# Patient Record
Sex: Male | Born: 1945 | Race: White | Hispanic: No | Marital: Married | Smoking: Former smoker
Health system: Southern US, Community
[De-identification: ages and names within clinical notes are randomized; demographics above are authoritative.]

## PROBLEM LIST (undated history)

## (undated) DIAGNOSIS — Z89612 Acquired absence of left leg above knee: Secondary | ICD-10-CM

## (undated) DIAGNOSIS — Z950 Presence of cardiac pacemaker: Secondary | ICD-10-CM

## (undated) DIAGNOSIS — J84111 Idiopathic interstitial pneumonia, not otherwise specified: Secondary | ICD-10-CM

## (undated) DIAGNOSIS — J449 Chronic obstructive pulmonary disease, unspecified: Secondary | ICD-10-CM

## (undated) DIAGNOSIS — R918 Other nonspecific abnormal finding of lung field: Secondary | ICD-10-CM

## (undated) DIAGNOSIS — J9621 Acute and chronic respiratory failure with hypoxia: Secondary | ICD-10-CM

## (undated) DIAGNOSIS — I495 Sick sinus syndrome: Secondary | ICD-10-CM

## (undated) DIAGNOSIS — I482 Chronic atrial fibrillation, unspecified: Secondary | ICD-10-CM

## (undated) DIAGNOSIS — J439 Emphysema, unspecified: Secondary | ICD-10-CM

## (undated) DIAGNOSIS — I4819 Other persistent atrial fibrillation: Secondary | ICD-10-CM

## (undated) DIAGNOSIS — G4733 Obstructive sleep apnea (adult) (pediatric): Secondary | ICD-10-CM

## (undated) DIAGNOSIS — J45909 Unspecified asthma, uncomplicated: Secondary | ICD-10-CM

## (undated) DIAGNOSIS — T82390A Other mechanical complication of aortic (bifurcation) graft (replacement), initial encounter: Secondary | ICD-10-CM

## (undated) DIAGNOSIS — I4891 Unspecified atrial fibrillation: Secondary | ICD-10-CM

## (undated) DIAGNOSIS — Z951 Presence of aortocoronary bypass graft: Secondary | ICD-10-CM

## (undated) DIAGNOSIS — M359 Systemic involvement of connective tissue, unspecified: Secondary | ICD-10-CM

## (undated) DIAGNOSIS — Z9981 Dependence on supplemental oxygen: Secondary | ICD-10-CM

## (undated) DIAGNOSIS — Z79899 Other long term (current) drug therapy: Secondary | ICD-10-CM

## (undated) DIAGNOSIS — Z7901 Long term (current) use of anticoagulants: Secondary | ICD-10-CM

## (undated) DIAGNOSIS — I2510 Atherosclerotic heart disease of native coronary artery without angina pectoris: Secondary | ICD-10-CM

## (undated) DIAGNOSIS — I10 Essential (primary) hypertension: Secondary | ICD-10-CM

## (undated) DIAGNOSIS — I5032 Chronic diastolic (congestive) heart failure: Secondary | ICD-10-CM

## (undated) DIAGNOSIS — J84112 Idiopathic pulmonary fibrosis: Secondary | ICD-10-CM

## (undated) DIAGNOSIS — I70208 Unspecified atherosclerosis of native arteries of extremities, other extremity: Secondary | ICD-10-CM

## (undated) DIAGNOSIS — Z8679 Personal history of other diseases of the circulatory system: Secondary | ICD-10-CM

## (undated) DIAGNOSIS — I82629 Acute embolism and thrombosis of deep veins of unspecified upper extremity: Secondary | ICD-10-CM

## (undated) DIAGNOSIS — E43 Unspecified severe protein-calorie malnutrition: Secondary | ICD-10-CM

## (undated) DIAGNOSIS — J189 Pneumonia, unspecified organism: Secondary | ICD-10-CM

## (undated) HISTORY — DX: Long term (current) use of anticoagulants: Z79.01

## (undated) HISTORY — DX: Atherosclerotic heart disease of native coronary artery without angina pectoris: I25.10

## (undated) HISTORY — DX: Chronic obstructive pulmonary disease, unspecified: J44.9

## (undated) HISTORY — DX: Presence of cardiac pacemaker: Z95.0

## (undated) HISTORY — PX: SPINAL FUSION: SHX223

## (undated) HISTORY — DX: Chronic diastolic (congestive) heart failure: I50.32

## (undated) HISTORY — DX: Other long term (current) drug therapy: Z79.899

## (undated) HISTORY — DX: Other mechanical complication of aortic (bifurcation) graft (replacement), initial encounter: T82.390A

## (undated) HISTORY — DX: Unspecified atrial fibrillation: I48.91

## (undated) HISTORY — DX: Obstructive sleep apnea (adult) (pediatric): G47.33

## (undated) HISTORY — DX: Unspecified severe protein-calorie malnutrition: E43

## (undated) HISTORY — DX: Pneumonia, unspecified organism: J18.9

## (undated) HISTORY — DX: Acute embolism and thrombosis of deep veins of unspecified upper extremity: I82.629

## (undated) HISTORY — DX: Emphysema, unspecified: J43.9

## (undated) HISTORY — DX: Unspecified atherosclerosis of native arteries of extremities, other extremity: I70.208

## (undated) HISTORY — DX: Other persistent atrial fibrillation: I48.19

## (undated) HISTORY — DX: Personal history of other diseases of the circulatory system: Z86.79

## (undated) HISTORY — DX: Acquired absence of left leg above knee: Z89.612

## (undated) HISTORY — PX: BELOW KNEE LEG AMPUTATION: SUR23

## (undated) HISTORY — DX: Other nonspecific abnormal finding of lung field: R91.8

## (undated) HISTORY — DX: Dependence on supplemental oxygen: Z99.81

## (undated) HISTORY — DX: Presence of aortocoronary bypass graft: Z95.1

## (undated) HISTORY — DX: Sick sinus syndrome: I49.5

## (undated) HISTORY — DX: Chronic atrial fibrillation, unspecified: I48.20

---

## 1999-01-02 ENCOUNTER — Encounter: Admission: RE | Admit: 1999-01-02 | Discharge: 1999-04-02 | Payer: Self-pay | Admitting: Anesthesiology

## 1999-01-19 ENCOUNTER — Encounter: Payer: Self-pay | Admitting: Pulmonary Disease

## 1999-04-07 ENCOUNTER — Encounter: Admission: RE | Admit: 1999-04-07 | Discharge: 1999-07-06 | Payer: Self-pay | Admitting: Anesthesiology

## 1999-05-12 ENCOUNTER — Encounter: Payer: Self-pay | Admitting: Pulmonary Disease

## 1999-05-12 ENCOUNTER — Ambulatory Visit: Admission: RE | Admit: 1999-05-12 | Discharge: 1999-05-12 | Payer: Self-pay | Admitting: Pulmonary Disease

## 1999-07-17 ENCOUNTER — Encounter: Admission: RE | Admit: 1999-07-17 | Discharge: 1999-10-15 | Payer: Self-pay | Admitting: Anesthesiology

## 1999-09-12 ENCOUNTER — Encounter: Payer: Self-pay | Admitting: Pulmonary Disease

## 1999-09-12 ENCOUNTER — Ambulatory Visit: Admission: RE | Admit: 1999-09-12 | Discharge: 1999-09-12 | Payer: Self-pay | Admitting: Pulmonary Disease

## 1999-10-20 ENCOUNTER — Encounter: Admission: RE | Admit: 1999-10-20 | Discharge: 2000-01-12 | Payer: Self-pay | Admitting: Anesthesiology

## 2000-01-12 ENCOUNTER — Encounter: Admission: RE | Admit: 2000-01-12 | Discharge: 2000-04-11 | Payer: Self-pay | Admitting: Anesthesiology

## 2000-04-19 ENCOUNTER — Encounter: Admission: RE | Admit: 2000-04-19 | Discharge: 2000-07-18 | Payer: Self-pay | Admitting: Anesthesiology

## 2000-06-11 HISTORY — PX: THORACIC AORTIC ANEURYSM REPAIR: SHX799

## 2000-06-11 HISTORY — PX: CORONARY ARTERY BYPASS GRAFT: SHX141

## 2000-07-22 ENCOUNTER — Encounter: Admission: RE | Admit: 2000-07-22 | Discharge: 2000-10-20 | Payer: Self-pay | Admitting: Anesthesiology

## 2000-10-17 ENCOUNTER — Encounter: Admission: RE | Admit: 2000-10-17 | Discharge: 2001-01-15 | Payer: Self-pay | Admitting: Anesthesiology

## 2004-04-04 ENCOUNTER — Encounter: Payer: Self-pay | Admitting: Pulmonary Disease

## 2005-04-27 ENCOUNTER — Ambulatory Visit: Payer: Self-pay | Admitting: Pulmonary Disease

## 2005-10-24 ENCOUNTER — Ambulatory Visit: Payer: Self-pay | Admitting: Pulmonary Disease

## 2006-04-18 ENCOUNTER — Ambulatory Visit: Payer: Self-pay | Admitting: Pulmonary Disease

## 2006-04-23 ENCOUNTER — Ambulatory Visit: Admission: RE | Admit: 2006-04-23 | Discharge: 2006-04-23 | Payer: Self-pay | Admitting: Pulmonary Disease

## 2006-04-23 ENCOUNTER — Encounter: Payer: Self-pay | Admitting: Pulmonary Disease

## 2007-11-18 ENCOUNTER — Ambulatory Visit: Payer: Self-pay | Admitting: Pulmonary Disease

## 2007-11-18 DIAGNOSIS — J439 Emphysema, unspecified: Secondary | ICD-10-CM

## 2007-11-18 DIAGNOSIS — G4733 Obstructive sleep apnea (adult) (pediatric): Secondary | ICD-10-CM

## 2007-11-18 HISTORY — DX: Emphysema, unspecified: J43.9

## 2007-11-27 ENCOUNTER — Telehealth: Payer: Self-pay | Admitting: Pulmonary Disease

## 2007-12-23 ENCOUNTER — Telehealth (INDEPENDENT_AMBULATORY_CARE_PROVIDER_SITE_OTHER): Payer: Self-pay | Admitting: *Deleted

## 2007-12-23 ENCOUNTER — Encounter: Payer: Self-pay | Admitting: Pulmonary Disease

## 2008-01-22 ENCOUNTER — Telehealth (INDEPENDENT_AMBULATORY_CARE_PROVIDER_SITE_OTHER): Payer: Self-pay | Admitting: *Deleted

## 2008-04-01 ENCOUNTER — Telehealth (INDEPENDENT_AMBULATORY_CARE_PROVIDER_SITE_OTHER): Payer: Self-pay | Admitting: *Deleted

## 2008-11-16 ENCOUNTER — Ambulatory Visit: Payer: Self-pay | Admitting: Pulmonary Disease

## 2009-04-16 ENCOUNTER — Encounter: Payer: Self-pay | Admitting: Pulmonary Disease

## 2009-06-09 ENCOUNTER — Telehealth (INDEPENDENT_AMBULATORY_CARE_PROVIDER_SITE_OTHER): Payer: Self-pay | Admitting: *Deleted

## 2010-03-23 ENCOUNTER — Ambulatory Visit: Payer: Self-pay | Admitting: Pulmonary Disease

## 2010-07-11 NOTE — Assessment & Plan Note (Signed)
Summary: rov for emphysema/asthma, osa   Visit Type:  Follow-up  CC:  1 year follow up. pt states he would like to talk about his inhalers. pt states breathing is doing good. pt states he has an on and off cough due to cpap machine makes his mouth dry. Pt states cpap use 5/7 nights x 6-8 hrs a night. quit smoking in 1998.Marland Kitchen  History of Present Illness: the pt comes in today for f/u of his known emphysema and osa.  He is wearing cpap compliantly, and is having no issues with his cpap mask or pressure.  He feels that he sleeps well, and is satisfied with his daytime alertness.  He is having some mouth dryness, and I have asked him to adjust his heated humidifier.  In terms of his emphysema, he feels that he is maintaining his usual baseline.  He is using advair sometimes only once a day, and feels the dose is often adequate for him.  Current Medications (verified): 1)  Mucinex 600 Mg  Tb12 (Guaifenesin) .... Take 1 Tablet By Mouth Two Times A Day 2)  Aleve 220 Mg  Tabs (Naproxen Sodium) .... Take 1 Tablet By Mouth Two Times A Day 3)  Furosemide 40 Mg  Tabs (Furosemide) .... Take 1 Tablet By Mouth Once A Day 4)  Isosorbide Mononitrate Cr 60 Mg  Tb24 (Isosorbide Mononitrate) .... Take 1 1/2 Tabs By Mouth Once Daily 5)  Omeprazole 20 Mg  Tbec (Omeprazole) .... Take 1 Tablet By Mouth Once A Day 6)  Aspirin Low Dose 81 Mg  Tabs (Aspirin) .... Take 2 Tablets By Mouth Once A Day 7)  Docusate Sodium 100 Mg  Tabs (Docusate Sodium) .... Take 1 Tablet By Mouth Two Times A Day 8)  Cascara Sagr[place] 450 Mg  Caps (Cascara [place]) .... Take 1 Tablet By Mouth Once A Day For Constipation 9)  Nitrostat 0.4 Mg  Subl (Nitroglycerin) .... Take By Mouth As Needed 10)  Neurontin 600 Mg Tabs (Gabapentin) .... Take 1 Tablet By Mouth Three Times A Day 11)  Ramipril 2.5 Mg Caps (Ramipril) .... Take 1 Tablet By Mouth Once A Day 12)  Advair Diskus 250-50 Mcg/dose  Misc (Fluticasone-Salmeterol) .... One Puff Twice Daily 13)   Proair Hfa 108 (90 Base) Mcg/act  Aers (Albuterol Sulfate) .... 2 Puffs Every 4-6 Hours As Needed 14)  Oxycodone Hcl 30 Mg Tabs (Oxycodone Hcl) .Marland Kitchen.. 1 Tablet Every 4 Hrs As Needed  Allergies (verified): 1)  ! Morphine 2)  ! * Fentanyl 3)  ! Lopressor 4)  Duragesic-12 (Fentanyl) 5)  Morphine Sulfate (Morphine Sulfate)  Review of Systems       The patient complains of shortness of breath with activity and non-productive cough.  The patient denies shortness of breath at rest, productive cough, coughing up blood, chest pain, irregular heartbeats, acid heartburn, indigestion, loss of appetite, weight change, abdominal pain, difficulty swallowing, sore throat, tooth/dental problems, headaches, nasal congestion/difficulty breathing through nose, sneezing, itching, ear ache, anxiety, depression, hand/feet swelling, joint stiffness or pain, rash, change in color of mucus, and fever.    Vital Signs:  Patient profile:   65 year old male Height:      76 inches Weight:      233.25 pounds BMI:     28.49 O2 Sat:      93 % on Room air Temp:     97.8 degrees F oral Pulse rate:   88 / minute BP sitting:   128 / 68  (  left arm) Cuff size:   large  Vitals Entered By: Carver Fila (March 23, 2010 10:58 AM)  O2 Flow:  Room air CC: 1 year follow up. pt states he would like to talk about his inhalers. pt states breathing is doing good. pt states he has an on and off cough due to cpap machine makes his mouth dry. Pt states cpap use 5/7 nights x 6-8 hrs a night. quit smoking in 1998. Comments meds and allergies updated Phone number updated Carver Fila  March 23, 2010 10:58 AM    Physical Exam  General:  wd male in nad Nose:  no skin breakdown or pressure necrosis from  cpap mask Lungs:  mild decrease in bs, no wheezing or rhonchi Heart:  rrr, no mrg Extremities:  mild LE edema on right, no cyanosis  Neurologic:  alert and oriented, moves all 4.   Impression & Recommendations:  Problem # 1:   EMPHYSEMA (ICD-492.8)  the pt is doing fairly well from a breathing standpoint, and I have asked  him to continue with advair.  We have discussed the dosage issue, and have given him the green light to vary his dose from one to two times a day depending upon how he feels.  If he is having to use his rescue inhaler daily, obviously he needs to be on the higher maintenance dose of advair.  Problem # 2:  OBSTRUCTIVE SLEEP APNEA (ICD-327.23)  the pt is doing well with cpap, and I have asked him to keep up with supplies and mask replacement  Medications Added to Medication List This Visit: 1)  Oxycodone Hcl 30 Mg Tabs (Oxycodone hcl) .Marland Kitchen.. 1 tablet every 4 hrs as needed  Other Orders: Flu Vaccine 46yrs + MEDICARE PATIENTS (Z6109) Administration Flu vaccine - MCR (G0008) Est. Patient Level III (60454)  Patient Instructions: 1)  can experiment with advair dose to find what works for you. 2)  continue with proair for rescue 3)  stay with cpap, keep up with mask changes and supplies 4)  will give you a flu shot today 5)  followup with me in one year.  Prescriptions: PROAIR HFA 108 (90 BASE) MCG/ACT  AERS (ALBUTEROL SULFATE) 2 puffs every 4-6 hours as needed  #1 x 12   Entered and Authorized by:   Barbaraann Share MD   Signed by:   Barbaraann Share MD on 03/23/2010   Method used:   Print then Give to Patient   RxID:   [id number] ADVAIR DISKUS 250-50 MCG/DOSE  MISC (FLUTICASONE-SALMETEROL) One puff twice daily  #1 x 12   Entered and Authorized by:   Barbaraann Share MD   Signed by:   Barbaraann Share MD on 03/23/2010   Method used:   Print then Give to Patient   RxID:   [id number]    Immunization History:  Influenza Immunization History:    Influenza:  historical (03/11/2009)       Flu Vaccine Consent Questions     Do you have a history of severe allergic reactions to this vaccine? no    Any prior history of allergic reactions to egg and/or gelatin? no    Do you have a  sensitivity to the preservative Thimersol? no    Do you have a past history of Guillan-Barre Syndrome? no    Do you currently have an acute febrile illness? no    Have you ever had a severe reaction to latex? no    Vaccine information given  and explained to patient? yes    Are you currently pregnant? no    Lot Number:[id number]   Exp Date:12/09/2010   Site Given  right Deltoid IMedflu  Carver Fila  March 23, 2010 11:34 AM

## 2010-10-27 NOTE — Procedures (Signed)
[facility]  Patient:    Grant Santos, Grant Santos                   MRN: [id number] Proc. Date: 02/02/00 Adm. Date:  [id number] Attending:  Thyra Breed CC:         Candy Sledge, M.D.                           Procedure Report  PROCEDURE:  Intravenous infusion of lidocaine.  ANESTHESIOLOGIST:  Thyra Breed, M.D.  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  INTERVAL HISTORY:  The patient has noted that his pain level is back up to 5/10.  He was last seen on January 12, 2000 for an infusion; we gave him 600 mg at that time.  He had a very positive response to this.  EXAMINATION:  VITAL SIGNS:  Blood pressure 138/70, heart rate is 80, respiratory rate is 17, O2 saturation is 97% and temperature is 97.3.  Pain level is 5/10.  CHEST:  The lungs are clear.  The patient is very tender on his left upper chest to light touch.  DESCRIPTION OF PROCEDURE:  After informed consent was obtained, the patient was placed in the supine position and monitored.  An IV was established in the right upper extremity.  I personally administered lidocaine 750 mg; the patients pain level went down to 0.  POST-PROCEDURAL CONDITION:  Stable.  DISCHARGE INSTRUCTIONS 1. Resume previous diet. 2. Limitation in activities per instruction sheet. 3. Continue on current medications, with prescription written for Neurontin    800 mg one p.o. b.i.d., #60 with four refills. 4. Follow up with me in three weeks to repeat his injection. DD:  02/02/00 TD:  02/05/00 Job: [id number]

## 2010-10-27 NOTE — Procedures (Signed)
[facility]  Patient:    Grant Santos, Grant Santos                   MRN: [id number] Proc. Date: 11/24/99 Adm. Date:  [id number] Attending:  Thyra Breed CC:         Candy Sledge, M.D.                           Procedure Report  PROCEDURE:  IV infusion of lidocaine using 300 mg.  INTERVAL HISTORY:  The patient noted that he had a marked improvement after his last infusion of chloroprocaine, but he developed a headache which lasted about seven days.  The headache occurred about six to seven hours after the infusion.  He was asking whether this could be related, and I advised him that it did not seem to be directly related but conceivable that the paramenobenzoic acid of the chloroprocaine might have exacerbated his headache-like syndrome.  Overall, he has done very well with his last infusion.  PHYSICAL EXAMINATION:  VITAL SIGNS:  Blood pressure 146-76, heart rate 84, respiratory rate 16, O2 saturation 95%, temperature 97.1.  Pain level is 1 out of 10.  NEUROLOGIC:  Neurologic exam is grossly unchanged.  DESCRIPTION OF PROCEDURE:  After informed consent was obtained, the patient was placed in the sitting position and an IV established in his right upper extremity.  Monitors were placed.  He was given 300 mg of lidocaine intravenously by me personally.  He tolerated this well.  POSTPROCEDURE CONDITION:  The patient noted resolution of his pain and no pressure-like sensation in his chest.  DISCHARGE INSTRUCTIONS: 1. Resume previous diet. 2. Limitation of activities per instruction sheet. 3. Continue on current medications. 4. Follow up with me in four weeks to consider repeat infusion. DD:  11/24/99 TD:  11/29/99 Job: [id number] NF/[id number]

## 2010-10-27 NOTE — Procedures (Signed)
[facility]  Patient:    Grant Santos, Grant Santos                   MRN: [id number] Proc. Date: 12/22/99 Adm. Date:  [id number] Attending:  Thyra Breed CC:         Candy Sledge, M.D.                           Procedure Report  PROCEDURE:  IV infusion of lidocaine.  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  INTERVAL HISTORY:  The patient has noted that for the past week, he has had a recurrence of his pain.  He is in a lot of discomfort today.  He states that it radiates from his shoulder and all the way through interiorly into the chest wall.  PHYSICAL EXAMINATION: Blood pressure 135/77.  Heart rate is 82.  Respiratory rate 14.  O2 saturations 95%.  Pain level is 7/10, and temperature is 97.2. His neuro exam is grossly unchanged.  DESCRIPTION OF PROCEDURE:  After informed consent was obtained, the patient was placed in the sitting position and an IV established in his right upper extremity and monitors placed.  I personally administered 550 mg of lidocaine. The patient noted that his pain level dropped down to a level of approximately 1/10 following the infusion.  Postprocedure condition - stable with decreased pain.  DISPOSITION: 1. Resume previous diet. 2. Limitations on activities per instruction sheet. 3. Continue on current medications. 4. Followup with me in three weeks for repeat infusion. DD:  12/22/99 TD:  12/22/99 Job: [id number]

## 2010-10-27 NOTE — Procedures (Signed)
[facility]  Patient:    Grant Santos, Grant Santos                   MRN: [id number] Proc. Date: 12/27/00 Adm. Date:  [id number] Attending:  Thyra Breed CC:         Dr. Noreene Filbert   Procedure Report  PROCEDURE:  IV infusion of lidocaine.  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  INTERVAL HISTORY:  The patients done fairly well on the Duragesic patch. He applies them every two days but he does note that when he changes his clothes he has increased discomfort. Other than that, he states he has done well overall. He rates his pain at 4/10. He has minimal side effects to the medications.  CURRENT MEDICATIONS:  Neurontin, ______, Prilosec, Bextra, Pulmicort, Serevent, Duragesic patch, Atrovent and guaifenex.  PHYSICAL EXAMINATION:  Blood pressure 152/70, heart rate 83, respiratory rate 11, O2 saturations 96%, and pain level is 4/10. His neuro exam is grossly unchanged from his last visit. He is in good spirits today.  DESCRIPTION OF PROCEDURE:  After informed consent was obtained, an IV was established in his right upper extremity and monitors placed. I personally administered 300 mg of lidocaine intravenously. The patient tolerated this well and his pain level went down to 1/10.  CONDITION POST PROCEDURE:  Stable.  DISCHARGE INSTRUCTIONS: 1. Resume previous diet. 2. Limitations in activities per instruction sheet as outlined by my assistant    today. 3. Continue on current medications but at his next refill of his Duragesic,    we may go on up to 50 mcg every two days. I advised him to let us know    how he is doing. 4. Followup with me in three months for follow-up evaluation and six months    to consider repeat IV infusion. DD:  12/27/00 TD:  12/27/00 Job: [id number]

## 2010-10-27 NOTE — Procedures (Signed)
[facility]  Patient:    Grant Santos, Grant Santos                   MRN: [id number] Proc. Date: 04/19/00 Adm. Date:  [id number] Attending:  Thyra Breed CC:         Candy Sledge, M.D.   Procedure Report  PROCEDURES:  IV infusion of lidocaine.  DIAGNOSES:  Neuropathic chest pain, status post thoracotomy for aneurysm repair, most likely on the basis of a intercostal neuropathy associated with this.  INTERVAL HISTORY:  The patient has noted that this pain had recurred to a modest extent.  He saw Dr. Noreene Filbert earlier today.  He does feel as though the OxyContin may be helping him.  CURRENT MEDICATIONS:  OxyContin 10 mg b.i.d., Ultram p.r.n., ______ Atrovent, Serevent, Pulmicort, Celebrex, Prilosec, Wellbutrin, Carbatrol, and Neurontin.  PHYSICAL EXAMINATION:  VITAL SIGNS:  Blood pressure 135/84, heart rate 86, respirations 16, O2 saturation is 96%.  NEUROLOGICAL:  Examination is grossly unchanged.  DESCRIPTION OF PROCEDURE:  After informed consent was obtained, the patient was placed in the supine position and monitored.  An IV was established in his right upper extremity.  I personally administered 500 mg of lidocaine intravenously.  The patients pain level went down to about 2-3/10.  POSTPROCEDURE CONDITION:  Stable.  DISCHARGE INSTRUCTIONS: 1. Resume previous diet. 2. Limitation of activities per instruction sheet. 3. Continue current medications. 4. Prescription for OxyContin 10 mg one p.o. t.i.d., #90 with no refill    given. 5. ______ 150 mg one p.o. q.p.m., #30 with no refill.  The patient was    advised of the potential side effects of this medication in detail. 6. Followup with me in four weeks for a repeat infusion if needed. DD:  04/19/00 TD:  04/20/00 Job: [id number]

## 2010-10-27 NOTE — Op Note (Signed)
[facility]  Patient:    Grant Santos, Grant Santos                   MRN: [id number] Proc. Date: 07/31/00 Adm. Date:  [id number] Attending:  Thyra Breed CC:         Candy Sledge, M.D.   Operative Report  PROCEDURE:  Intravenous infusion of lidocaine.  DIAGNOSIS:  Neuropathic pain status post thoracotomy for resection of a thoracic aneurysm with element of intercostal neuropathy.  ANESTHESIOLOGIST:  Thyra Breed, M.D.  INTERVAL HISTORY:  The patient is now six weeks out and states that he has done much better which he attributes to his wheelchair and being on the OxyContin 10 mg 3 times a day.  His other medications include Atrovent, Serevent, Pulmicort, Celebrex, Prilosec, Wellbutrin, and Neurontin.  PHYSICAL EXAMINATION:  VITAL SIGNS:  Blood pressure 160/84, heart rate 75, respiratory rate 16, O2 saturation 97%.  NEUROLOGIC:  Grossly unchanged.  He does feel as though his pain is much more focused than previously.  DESCRIPTION OF PROCEDURE:  After informed consent was obtained, the patient was placed in the supine position and monitored.  An IV was established in the right upper extremity, and I personally administered 600 mg of lidocaine.  The patients pain level went down to 1/10.  POSTPROCEDURE CONDITION:  Stable.  DISCHARGE INSTRUCTIONS: 1. Resume previous diet. 2. Limitation of activities per instruction sheet. 3. Continue on OxyContin 10 mg 1 p.o. q.8h., #90 with no refill. 4. Follow up with me in six weeks for repeat infusion. DD:  07/31/00 TD:  08/01/00 Job: [id number] IO/[id number]

## 2010-10-27 NOTE — Op Note (Signed)
[facility]  Patient:    Grant Santos, Grant Santos                   MRN: [id number] Proc. Date: 10/20/99 Adm. Date:  [id number] Attending:  Thyra Breed CC:         Candy Sledge, M.D.             Barbaraann Share, M.D. [facility]                           Operative Report  PROCEDURE:  IV infusion of chloroprocaine.  DIAGNOSIS:  Neuropathic chest wall pain and low back discomfort.  INTERVAL HISTORY:  The patient has noted that the lidocaine does not seem to be holding his pain under as good control as previously.  He is requesting that we switch back over to the chloroprocaine.  He rates his pain at 6 out of 10 today.  This is in the same distribution as previously (over the left thoracic region).  EXAMINATION:  VITAL SIGNS:  Blood pressure 151/85, heart rate 92, respiratory rate 18, O2 saturations 95%, pain level 6 out of 10.  NEUROLOGIC:  He exhibits no change.  PROCEDURE:  After an informed consent was obtained, an IV was established in the left upper extremity.  Monitors were placed.  I personally administered 1500 mg of chloroprocaine intravenously.  The patient noted his pain level went down to 0 out of 10.  POSTPROCEDURE CONDITION:  Stable.  DISCHARGE INSTRUCTIONS: 1. Resume previous diet. 2. Limitation in activities per instruction sheet. 3. Continue with current medications. 4. The patient is to follow up with me in three to six weeks for a    repeat infusion. DD:  10/20/99 TD:  10/24/99 Job: [id number]

## 2010-10-27 NOTE — Procedures (Signed)
[facility]  Patient:    Grant Santos, Grant Santos                   MRN: [id number] Proc. Date: 03/22/00 Adm. Date:  [id number] Attending:  Thyra Breed CC:         Candy Sledge, M.D.   Procedure Report  PROCEDURE:  Intravenous infusion of lidocaine.  DIAGNOSIS:  Neuropathy and post thoracotomy pain syndrome.  INTERVAL HISTORY:  The patient initially noted increased discomfort following his last infusion, but as he had continued on the OxyContin, he has really mellowed out to a level of about 3/10.  He is in much better spirits today than he was at his last visit.  We continue to go through a saga with regard to his wheelchair.  PHYSICAL EXAMINATION:  Blood pressure 144/81, heart rate 93, respiratory rate 18, O2 saturation 94%, pain level 3/10.  His neurologic exam is grossly unchanged.  DESCRIPTION OF PROCEDURE:  After informed consent was obtained, the patient had an IV established in his right upper extremity.  Monitors were placed.  I personally administered 450 mg of lidocaine.  The patients pain level was down to 1/10 at most.  POSTPROCEDURE CONDITION:  Stable.  DISCHARGE INSTRUCTIONS: 1. Resume previous diet. 2. Limitations of activity per instruction sheath. 3. Continue on current medications. 4. A prescription for OxyContin 10 mg one p.o. b.i.d., #60, was written with    no refills. 5. Follow up with me in four weeks. DD:  03/22/00 TD:  03/24/00 Job: [id number]

## 2010-10-27 NOTE — Procedures (Signed)
[facility]  Patient:    Grant Santos, Grant Santos                   MRN: [id number] Proc. Date: 11/03/99 Adm. Date:  [id number] Attending:  Thyra Breed CC:         Candy Sledge, M.D.             Barbaraann Share, M.D. [facility]                           Procedure Report  PROCEDURE:  Intravenous infusion of chloroprocaine.  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  INTERVAL HISTORY:  The patient is doing fairly well today, responded quite nicely to his last infusion.  We gave him 1500 mm at his last visit.  PHYSICAL EXAMINATION:  VITAL SIGNS:  Blood pressure is 135/70, heart rate 89, respiratory rate 16, O2 saturation is 93%, pain level is 2 out of 10, and temperature is 97 degrees.  NEUROLOGIC:  Unchanged from previously.  DESCRIPTION OF PROCEDURE:  After informed consent was obtained, IV was established in the left upper extremity.  Monitors were placed.  I personally administered 1500 mg of chloroprocaine.  The patient noted his pain level down to 0 out of 10.  POSTPROCEDURE CONDITION:  Stable.  DISCHARGE INSTRUCTIONS: 1. Resume previous diet. 2. Limitation on activities per instruction sheet. 3. Continue current medications. 4. Follow up with me in three weeks for a repeat infusion.  We will do this    for two more runs and then spread it out to once a month thereafter. DD:  11/03/99 TD:  11/08/99 Job: [id number] IO/[id number]

## 2010-10-27 NOTE — Procedures (Signed)
[facility]  Patient:    Grant Santos, Grant Santos                   MRN: [id number] Proc. Date: 06/20/00 Adm. Date:  [id number] Attending:  Thyra Breed CC:         Candy Sledge, M.D.   Procedure Report  PROCEDURE:  IV infusion of lidocaine.  DIAGNOSIS:  Neuropathic pain over his chest wall status post resection of a thoracic aneurysm.  INTERVAL HISTORY:  The patients follow-up and infusion today were delayed by a wait because of his insurance company.  He has noted that his pain is much worse than it has been.  He continues on his previous medications which include Atrovent, Serevent, Pulmicort, Celebrex, Prilosec, Wellbutrin, OxyContin, and Neurontin.  PHYSICAL EXAMINATION:  Blood pressure 135/80.  Heart rate is 76.  Respiratory rate 20.  O2 saturations 97%.  Pain level is 6/10.  His neuro exam is grossly unchanged.  DESCRIPTION OF PROCEDURE:  After informed consent was obtained, the patient was placed in the supine position and monitored.  An IV was established in his left upper extremity, and I personally administered 600 mg of lidocaine. The patients pain level went down to a about 1/10.  Postprocedure condition - stable.  DISPOSITION: 1. Resume previous diet. 2. Limitations on activities per instruction sheet. 3. OxyContin 10 mg 1 p.o. q.8h. #90 with no refill. 4. Followup with me in four weeks for a repeat infusion. DD:  06/20/00 TD:  06/21/00 Job: [id number]

## 2010-10-27 NOTE — H&P (Signed)
[facility]  Patient:    Grant Santos, Grant Santos                   MRN: [id number] Adm. Date:  [id number] Attending:  Thyra Breed CC:         Candy Sledge, M.D.   History and Physical  FOLLOWUP EVALUATION  HISTORY OF PRESENT ILLNESS:  [name] comes in for follow-up evaluation of his neuropathic pain secondary to a thoracotomy for a resection of a thoracic aneurysm.  Since his last evaluation, the patient has had good days and bad, but we had to go up to 10 mg 3 x q.d. of OxyContin a few weeks ago because he was having increasing discomfort.  He has tolerated this well.  He continues to have some breakthrough between the doses.  He is tolerating it well otherwise.  He has also switched from Celebrex to Bextra.  CURRENT MEDICATIONS:  1. Pulmicort.  2. Serevent.  3. Neurontin 800 mg 2 x q.d.  4. Carbatrol 300 mg 2 x q.d.  5. Prilosec.  6. Bextra.  7. Guaifenex.  8. Atrovent.  9. OxyContin. 10. Aspirin q.d.  PHYSICAL EXAMINATION:  VITAL SIGNS:  Blood pressure 155/83, heart rate 92, respiratory rate 18, and O2 saturation 94%.  Pain level 3/10.  NEUROLOGIC:  Basically unchanged.  IMPRESSION:  1. 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  2. Other medical problems per primary care physician.  DISPOSITION:  1. Discontinue OxyContin.  2. Duragesic Patch 25 mcg 1 q.3 days, #10 with no refill.  3. The patient was advised to call if he was not noticing any benefits from     this.  4. Followup with me in 4 weeks, at which time we will consider repeat     infusion. DD:  10/18/00 TD:  10/18/00 Job: [id number]

## 2010-10-27 NOTE — Procedures (Signed)
[place]  Patient:    Grant Santos, TODISCO                   MRN: [id number] Proc. Date: 02/23/00 Adm. Date:  [id number] Attending:  Thyra Breed CC:         Candy Sledge, M.D.   Procedure Report  PROCEDURE:  IV infusion of lidocaine.  DIAGNOSIS:  Post thoracotomy pain syndrome with intercostal neuropathy.  INTERVAL HISTORY:  The patient notes that his pain is starting to come back pretty much with a vengeance. He rates it at 4/10 but if any weight touches his left shoulder or chest wall, he has a lot of discomfort.  PHYSICAL EXAMINATION:  VITAL SIGNS:  Blood pressure 140/77, heart rate 83, respiratory rate 13, O2 saturations 95%, pain 4/10 and temperature is 97.1.  LUNGS:  Clear.  HEART:  Regular rate and rhythm.  NEUROLOGIC:  Grossly unchanged. He does have allodynia over the left upper chest wall to light touch.  DESCRIPTION OF PROCEDURE:  After informed consent was obtained, the patient was placed in the supine position and monitored. An IV was established in the right upper extremity extremity. I personally administered 675 mg of lidocaine. The patients pain level went down to 0/10.  CONDITION POST PROCEDURE:  Stable.  DISCHARGE INSTRUCTIONS: 1. Resume previous diet. 2. Limitations in activities per instruction sheet. 3. Continue on current medications. 4. Continue on Neurontin. 5. The patient is scheduled to see me back in about 3 weeks for repeat    infusion. He seems to be coming back with his pain within a 3 week span    and I do not feel comfortable in spreading him out to 4 weeks for the    time being. He has done well. He has had a letter written for his    wheelchair so that he can continue to work productively. DD:  02/23/00 TD:  02/26/00 Job: [id number]

## 2010-10-27 NOTE — H&P (Signed)
[facility]  Patient:    Grant Santos, Grant Santos                   MRN: [id number] Adm. Date:  [id number] Attending:  Thyra Breed CC:         Candy Sledge, M.D.   History and Physical  PROCEDURES:  IV infusion of lidocaine.  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  INTERVAL HISTORY:  The patient was doing well up until about a week ago, but has noted his pain is starting to come back.  He did not tolerate the mexiletine due to GI problems.  He continues on Atrovent, Serevent, Pulmicort, Celebrex, Prilosec, Wellbutrin, _________ , and his Neurontin.  PHYSICAL EXAMINATION:  VITAL SIGNS:  Blood pressure 158/81, heart rate 76, respiratory rate 16, O2 saturations 96%.  He is tender over the left upper quadrant and left upper extremity as well as over his left chest wall.  NEUROLOGIC:  Otherwise unchanged.  PROCEDURE:  After informed consent was obtained, patient was placed in the supine position and monitored.  An IV was established in the right upper quadrant.  I personally administered 500 mg of lidocaine intravenously.  The patient noted that his pain level came down to nearly 0/10.  POST PROCEDURE CONDITION:  Stable.  DISCHARGE INSTRUCTIONS: 1. Resume previous diet. 2. Limitation of activities per instructions.  See as outlined by my assistant    today. 3. Remain off mexiletine. 4. OxyContin 10 mg one p.o. q.8h., #90, with no refill. 5. Follow up with me in four weeks for repeat infusion. DD:  05/17/00 TD:  05/17/00 Job: [id number]

## 2010-10-27 NOTE — Procedures (Signed)
[facility]  Patient:    Grant Santos, Grant Santos                   MRN: [id number] Proc. Date: 01/12/00 Adm. Date:  [id number] Attending:  Thyra Breed CC:         Candy Sledge, M.D.   Procedure Report  PREOPERATIVE DIAGNOSIS:  Left-sided chest wall pain on the basis of a post-thoracotomy chest syndrome with intercostal neuropathy.  POSTOPERATIVE DIAGNOSIS:  Left-sided chest wall pain on the basis of a post-thoracotomy chest syndrome with intercostal neuropathy.  PROCEDURE:  IV infusion of lidocaine.  SURGEON:  Thyra Breed, M.D.  ANESTHESIA:  INTERVAL HISTORY:  The patient has noted that his pain has come back, but not as bad as his previous visit.  He rates his pain at 4 out of 10.  He tells me that at work, he does not use a motorized wheelchair.  He is unable to make it through the day because he develops a sharp, sheering pain into his chest wall from the neuropathy.  When he uses the wheelchair, he is able to make it through the day and make it through the week.  PHYSICAL EXAMINATION:  Blood pressure 154/69, heart rate 81, respiratory rate 18, and O2 saturations 96%.  Pain level is 4 out of 10.  Temperature is 98.3. The patient is very guarding of his left upper extremity and apprehensive about anybody approaching him from that side.  His neurologic exam is grossly unchanged.  DESCRIPTION OF PROCEDURE:  After informed consent was obtained, the patient was placed in the supine position and monitored.  An IV was established in the right upper extremity.  I personally administered 600 mg of intravenous lidocaine and the patient noted that his pain dropped down to near zero.  POSTPROCEDURE CONDITION:  Stable.  DISCHARGE INSTRUCTIONS: 1. Resume previous diet. 2. Limitation and activities per instruction sheet. 3. Continue on current medication. 4. Followup with me in three weeks to consider repeat infusion. DD:  01/12/00 TD:   01/15/00 Job: [id number]

## 2011-03-15 ENCOUNTER — Encounter: Payer: Self-pay | Admitting: Pulmonary Disease

## 2011-03-21 ENCOUNTER — Encounter: Payer: Self-pay | Admitting: Pulmonary Disease

## 2011-03-21 ENCOUNTER — Ambulatory Visit (INDEPENDENT_AMBULATORY_CARE_PROVIDER_SITE_OTHER): Payer: Medicare Other | Admitting: Pulmonary Disease

## 2011-03-21 DIAGNOSIS — G4733 Obstructive sleep apnea (adult) (pediatric): Secondary | ICD-10-CM

## 2011-03-21 DIAGNOSIS — J438 Other emphysema: Secondary | ICD-10-CM

## 2011-03-21 DIAGNOSIS — Z23 Encounter for immunization: Secondary | ICD-10-CM

## 2011-03-21 MED ORDER — ALBUTEROL SULFATE HFA 108 (90 BASE) MCG/ACT IN AERS: 2.0000 | INHALATION_SPRAY | Freq: Four times a day (QID) | RESPIRATORY_TRACT | Status: AC | PRN

## 2011-03-21 NOTE — Assessment & Plan Note (Signed)
The patient is doing well from this standpoint, and has really not been taking his maintenance Advair.  He would like to see if he can get by on as needed albuterol alone.  I am okay with this, but have asked him to restart Advair if he sees worsening of his symptoms.

## 2011-03-21 NOTE — Assessment & Plan Note (Signed)
The patient has been doing well with CPAP, and denies any issues with his mask or pressure.  He feels that he is sleeping well, with adequate daytime alertness.  I've asked him to work aggressively on weight loss.

## 2011-03-21 NOTE — Patient Instructions (Signed)
Ok to try getting by with albuterol alone as needed.  If increased symptoms, need to get back on advair two times a day. Continue with cpap, work on weight loss Would be happy to review your ct chest if you bring disk by the office. followup with me in one year.

## 2011-03-21 NOTE — Progress Notes (Signed)
  Subjective:    Patient ID: Grant Santos, male    DOB: [birth year], 65 y.o.   MRN: [id number]  HPI The patient comes in today for followup of his known emphysema and sleep apnea.  He is wearing his CPAP device compliantly, and feels that it is working well for him.  He denies any breathing issues at this time, and is not using his Advair compliantly.  He wishes to try coming off the medication, and using albuterol as needed.  The patient also mentions that he has had a recent chest x-ray that showed pulmonary nodules.  He is scheduled for a CT of his chest.  I have told him that I am happy to review with him if he will bring a disc by the office.   Review of Systems  Constitutional: Negative for fever and unexpected weight change.  HENT: Negative for ear pain, nosebleeds, congestion, sore throat, rhinorrhea, sneezing, trouble swallowing, postnasal drip and sinus pressure.   Eyes: Positive for redness and itching.  Respiratory: Positive for cough, chest tightness, shortness of breath and wheezing.   Cardiovascular: Negative for palpitations and leg swelling.  Gastrointestinal: Negative for nausea and vomiting.  Genitourinary: Negative for dysuria.  Musculoskeletal: Negative for joint swelling.  Skin: Negative for rash.  Neurological: Positive for headaches.  Hematological: Does not bruise/bleed easily.  Psychiatric/Behavioral: Negative for dysphoric mood. The patient is not nervous/anxious.        Objective:   Physical Exam Well developed male in no acute distress Nose with no purulence or discharge, no skin breakdown or pressure necrosis from the CPAP mask Chest totally clear to auscultation Cardiac exam with regular rate and rhythm Note lower extremity edema Alert and oriented, moves all 4 extremities.       Assessment & Plan:

## 2011-04-20 ENCOUNTER — Telehealth: Payer: Self-pay | Admitting: Pulmonary Disease

## 2011-04-20 NOTE — Telephone Encounter (Signed)
Called and spoke with pt. Pt aware of CT results and KC's response.

## 2011-04-20 NOTE — Telephone Encounter (Signed)
Megan, please let pt know that I have reviewed his ct and report.  He has small nodules in the lungs that are unchanged from 2006 per report.  Therefore are benign. No further f/u needed.

## 2011-06-19 ENCOUNTER — Other Ambulatory Visit: Payer: Self-pay | Admitting: Pulmonary Disease

## 2011-06-19 MED ORDER — FLUTICASONE-SALMETEROL 250-50 MCG/DOSE IN AEPB: 1.0000 | INHALATION_SPRAY | Freq: Two times a day (BID) | RESPIRATORY_TRACT | Status: DC

## 2011-06-19 NOTE — Telephone Encounter (Signed)
I have sent rx in and confirmed the pharmacy did receive it.

## 2011-10-31 ENCOUNTER — Other Ambulatory Visit: Payer: Self-pay | Admitting: Pulmonary Disease

## 2012-03-20 ENCOUNTER — Encounter: Payer: Self-pay | Admitting: Pulmonary Disease

## 2012-03-20 ENCOUNTER — Ambulatory Visit (INDEPENDENT_AMBULATORY_CARE_PROVIDER_SITE_OTHER): Payer: Medicare Other | Admitting: Pulmonary Disease

## 2012-03-20 VITALS — BP 140/60 | HR 92 | Temp 97.9°F | Ht 74.0 in | Wt 237.8 lb

## 2012-03-20 DIAGNOSIS — J438 Other emphysema: Secondary | ICD-10-CM

## 2012-03-20 MED ORDER — BUDESONIDE-FORMOTEROL FUMARATE 160-4.5 MCG/ACT IN AERO: 2.0000 | INHALATION_SPRAY | Freq: Two times a day (BID) | RESPIRATORY_TRACT | Status: DC

## 2012-03-20 NOTE — Assessment & Plan Note (Signed)
The patient is doing fairly well from a pulmonary standpoint, and appears to be stable.  I have asked him to check and see if dulera or symbicort or on his formulary at second tier.  He is to let me know.  We will give him a flu shot today, and I have asked him to call if he has any worsening of symptoms.  Otherwise, he will followup with me in one year

## 2012-03-20 NOTE — Patient Instructions (Addendum)
Please check to see if symbicort 160/4.5 or dulera 100/5 are on your drug formulary, and we can try in the place of advair. Will give you the flu shot today. followup with me in 1 year.

## 2012-03-20 NOTE — Progress Notes (Signed)
  Subjective:    Patient ID: Grant Santos, male    DOB: October 05, 1945, 66 y.o.   MRN: [id number]  HPI The patient comes in today for followup of his known COPD.  His sleep apnea is now being followed by the [facility].  Overall he is doing fairly well from a pulmonary standpoint, but has noticed that he starts to have increased symptoms at the very end of the dose.  He also tells me that Advair is third tier on his formulary, and wants to see if there is anything that is second tier.  Overall he is doing well from a breathing standpoint, and feels that his dyspnea on exertion is at baseline.  He has no significant cough or congestion.  He is occasionally having episodes where he will suddenly feel clammy with dizziness, but not shortness of breath.  I have asked him to discuss this with his primary physician ASAP.   Review of Systems  Constitutional: Negative for fever and unexpected weight change.  HENT: Negative for ear pain, nosebleeds, congestion, sore throat, rhinorrhea, sneezing, trouble swallowing, dental problem, postnasal drip and sinus pressure.   Eyes: Negative for redness and itching.  Respiratory: Positive for cough, shortness of breath and wheezing. Negative for chest tightness.   Cardiovascular: Negative for palpitations and leg swelling.  Gastrointestinal: Negative for nausea and vomiting.  Genitourinary: Negative for dysuria.  Musculoskeletal: Negative for joint swelling.  Skin: Negative for rash.  Neurological: Positive for headaches.  Hematological: Bruises/bleeds easily.  Psychiatric/Behavioral: Negative for dysphoric mood. The patient is not nervous/anxious.        Objective:   Physical Exam Well-developed male in no acute distress Nose without purulence or discharge noted Chest with mildly decreased breath sounds, no wheezes or rhonchi Cardiac exam with regular rate and rhythm Right lower extremity without edema or cyanosis Alert and oriented, moves all 4  extremities.       Assessment & Plan:

## 2012-03-21 ENCOUNTER — Encounter: Payer: Self-pay | Admitting: Pulmonary Disease

## 2012-04-15 ENCOUNTER — Telehealth: Payer: Self-pay | Admitting: Pulmonary Disease

## 2012-04-15 NOTE — Telephone Encounter (Signed)
Pt returned call.  Advised of KC's recs as stated below.  Pt to come to the office tomorrow morning to receive a sample and instruct on the spiriva.  Pt stated that he did see his PCP regarding his SOB vs his heart disease and she recommended that he follow up with cardiologist > has an appt tomorrow 11.6.13.  symbicort removed from med list; spiriva added.  Sample of spiriva left up front for pt.  Will sign and forward to [facility] to make him aware of pt's upcoming appt with Dr [facility] cardiologist.

## 2012-04-15 NOTE — Telephone Encounter (Signed)
Let him know to stay on advair for now, since this has worked for him in the past.  See if he has ever tried spiriva.  If not, can try this one inhalation each am in addition to the advair. Also remind him that heart disease is a common cause of shortness of breath, and the way his breathing has worsened over a short period of time suggests this may be a cause.  AT the last visit I talked with him about considering this with his primary md.  Did he do so?

## 2012-04-15 NOTE — Telephone Encounter (Signed)
I spoke with pt and he stated at last OV 03/20/12 his advair was changed to symbicort. Per pt the symbicort did not help any and it also made his breathing worse. So he decided to go back on advair 5 days ago. He is still " short winded" at rest and w/ least little activity. He is wanting to know if he needs to change to a different inhaler or what does KC recommend. Please advise Dr. Shelle Iron, thanks

## 2012-04-15 NOTE — Telephone Encounter (Signed)
[email or web address].Jennifer Castillo, CMA  

## 2012-04-16 NOTE — Telephone Encounter (Signed)
Pt came in to pick up spiriva sample.  I instructed on spiriva technique.  He verbalized understanding and demonstrated proper use of spiriva.  Nothing further needed at this time.

## 2012-04-23 ENCOUNTER — Telehealth: Payer: Self-pay | Admitting: Pulmonary Disease

## 2012-04-23 MED ORDER — TIOTROPIUM BROMIDE MONOHYDRATE 18 MCG IN CAPS: 18.0000 ug | ORAL_CAPSULE | Freq: Every day | RESPIRATORY_TRACT | Status: DC

## 2012-04-23 NOTE — Telephone Encounter (Signed)
Pt informed that Spiriva was sent to [facility] in [place].

## 2012-05-13 ENCOUNTER — Telehealth: Payer: Self-pay | Admitting: Pulmonary Disease

## 2012-05-13 NOTE — Telephone Encounter (Signed)
LMTCB

## 2012-05-14 MED ORDER — ACLIDINIUM BROMIDE 400 MCG/ACT IN AEPB: 1.0000 | INHALATION_SPRAY | Freq: Two times a day (BID) | RESPIRATORY_TRACT | Status: DC

## 2012-05-14 NOTE — Telephone Encounter (Signed)
LMTCB

## 2012-05-14 NOTE — Telephone Encounter (Signed)
In rare circumstances, spiriva may interfere with urination.  There is a medication that is in the same class as spiriva, but may not affect your urination?? Hard to know without trying. If he wishes to try tudorza, he can come by and get 2 samples, and take one inhalation am and pm.

## 2012-05-14 NOTE — Telephone Encounter (Signed)
Pt returned call. Grant Santos  

## 2012-05-14 NOTE — Telephone Encounter (Signed)
I spoke with the pt and he states that he had to stop taking spiriva because he developed difficulty urinating. He states he stopped spiriva day befre thanksgiving. Pt states after he stopped he was able to urinate normal. Pt wants to know are there any alternatives to spiriva. Pt is taking Advair as directed. Please advise.Grant Santos, CMA Allergies  Allergen Reactions  . Fentanyl     REACTION: unspecified  . Metoprolol Tartrate     REACTION: sob  . Morphine     REACTION: itching  . Morphine Sulfate     REACTION: unspecified

## 2012-05-14 NOTE — Telephone Encounter (Signed)
Spoke with pt and notified of recs per [facility] Pt verbalized understanding He will pick up samples tomorrow and is aware to ask for triage nurse so we can show him technique Samples up front

## 2012-06-10 ENCOUNTER — Telehealth: Payer: Self-pay | Admitting: Pulmonary Disease

## 2012-06-10 MED ORDER — FLUTICASONE-SALMETEROL 250-50 MCG/DOSE IN AEPB: 1.0000 | INHALATION_SPRAY | Freq: Two times a day (BID) | RESPIRATORY_TRACT | Status: DC

## 2012-06-10 MED ORDER — ACLIDINIUM BROMIDE 400 MCG/ACT IN AEPB: 1.0000 | INHALATION_SPRAY | Freq: Two times a day (BID) | RESPIRATORY_TRACT | Status: DC

## 2012-06-10 NOTE — Telephone Encounter (Signed)
Refills have been sent in, pt is aware.

## 2012-07-09 ENCOUNTER — Telehealth: Payer: Self-pay | Admitting: Pulmonary Disease

## 2012-07-09 NOTE — Telephone Encounter (Signed)
noted 

## 2012-07-26 ENCOUNTER — Other Ambulatory Visit: Payer: Self-pay

## 2012-12-04 ENCOUNTER — Other Ambulatory Visit: Payer: Self-pay | Admitting: Pulmonary Disease

## 2013-01-02 ENCOUNTER — Other Ambulatory Visit: Payer: Self-pay | Admitting: Pulmonary Disease

## 2013-01-14 ENCOUNTER — Other Ambulatory Visit: Payer: Self-pay

## 2013-03-20 ENCOUNTER — Encounter: Payer: Self-pay | Admitting: Pulmonary Disease

## 2013-03-20 ENCOUNTER — Ambulatory Visit (INDEPENDENT_AMBULATORY_CARE_PROVIDER_SITE_OTHER): Payer: Medicare Other | Admitting: Pulmonary Disease

## 2013-03-20 VITALS — BP 132/70 | HR 81 | Temp 97.7°F | Ht 74.0 in | Wt 247.4 lb

## 2013-03-20 DIAGNOSIS — G4733 Obstructive sleep apnea (adult) (pediatric): Secondary | ICD-10-CM

## 2013-03-20 DIAGNOSIS — J438 Other emphysema: Secondary | ICD-10-CM

## 2013-03-20 NOTE — Assessment & Plan Note (Signed)
The patient has done better with the addition of tudorza, but he is concerned about having to co-pays.  He is asking about medication consolidation, and upon review I am questioning whether he still needs inhaled corticosteroids.  He would like to try anoro, and I will start him on this in the place of Advair and tudorza.  I have also stressed to him the importance of a conditioning program and weight loss.

## 2013-03-20 NOTE — Progress Notes (Signed)
  Subjective:    Patient ID: Grant Santos, male    DOB: [birth year], 67 y.o.   MRN: [id number]  HPI The patient comes in today for followup of his known COPD.  He has seen a big difference with the tudorza, but is concerned about his multiple co-pays.  He would like to consolidate his medications as much as possible.  He denies any significant cough or mucus production.  He also has obstructive sleep apnea, and has been getting his care through the [facility], but would like to switch this back over to [facility].  He has been wearing CPAP compliantly, and feels that he sleeps well with the device.   Review of Systems  Constitutional: Negative for fever and unexpected weight change.  HENT: Negative for congestion, dental problem, ear pain, nosebleeds, postnasal drip, rhinorrhea, sinus pressure, sneezing, sore throat and trouble swallowing.   Eyes: Negative for redness and itching.  Respiratory: Negative for cough, chest tightness, shortness of breath and wheezing.   Cardiovascular: Negative for palpitations and leg swelling.  Gastrointestinal: Negative for nausea and vomiting.  Genitourinary: Negative for dysuria.  Musculoskeletal: Negative for joint swelling.  Skin: Negative for rash.  Neurological: Negative for headaches.  Hematological: Does not bruise/bleed easily.  Psychiatric/Behavioral: Negative for dysphoric mood. The patient is not nervous/anxious.        Objective:   Physical Exam Overweight male in no acute distress Nose with purulent discharge noted Neck without lymphadenopathy or thyromegaly No skin breakdown or pressure necrosis from the CPAP mass Chest totally clear to auscultation, no wheezing Cardiac exam with regular rate and rhythm Right lower extremity without edema, no cyanosis Alert and oriented, does not appear to be sleepy, moves all 4 extremities.       Assessment & Plan:

## 2013-03-20 NOTE — Patient Instructions (Signed)
Stop advair and tudorza for now, and try anoro one inhalation each am for next few weeks.  Let me know what you think. Will send an order to [facility] to service your machine. followup with me in one year if doing well.

## 2013-03-20 NOTE — Assessment & Plan Note (Signed)
The patient wishes to move his care back to [facility], and we'll send an order to serve as his CPAP device.

## 2013-04-03 ENCOUNTER — Telehealth: Payer: Self-pay | Admitting: Pulmonary Disease

## 2013-04-03 NOTE — Telephone Encounter (Signed)
Called and spoke with pt and he stated that the anora is not working for him x 2 weeks.  He wanted to call and make [facility] aware that he has gone back to the [place] and the advair.  FYI for KC.  Pt is aware that [facility] is not in the office but i will forward to make him aware.

## 2013-04-16 ENCOUNTER — Other Ambulatory Visit: Payer: Self-pay

## 2013-04-16 ENCOUNTER — Other Ambulatory Visit: Payer: Self-pay | Admitting: Pulmonary Disease

## 2013-04-16 NOTE — Telephone Encounter (Signed)
Patient requesting refill of Grant Santos Per last OV--instructed to stop and try the Anoro. Pt requested this d/t being the donut hole and wanting to try a combination inhaler that would be cheaper.  Per msg below pt has decided to switch back:  Marcellus Scott, CMA at 04/03/2013 9:38 AM    Status: Signed        Called and spoke with pt and he stated that the anora is not working for him x 2 weeks. He wanted to call and make [facility] aware that he has gone back to the [place] and the advair. FYI for KC. Pt is aware that [facility] is not in the office but i will forward to make him aware.    Tudorza refill sent.

## 2013-06-07 ENCOUNTER — Other Ambulatory Visit: Payer: Self-pay | Admitting: Pulmonary Disease

## 2013-11-26 ENCOUNTER — Telehealth: Payer: Self-pay | Admitting: Pulmonary Disease

## 2013-11-26 NOTE — Telephone Encounter (Signed)
Called and spoke to [facility] with [facility]. Pt is having TURP on 6/24 and Carla wanted to know if pt needed clearance for this sx. Pt is yearly f/u with emphysema and was last seen 03/2013. [place] please advise if pt will need appt with you before sx. Thanks.

## 2013-11-26 NOTE — Telephone Encounter (Signed)
I received requests at my desk as well for this. [place] off this PM so I scheduled the pt for an appt tomorrow at 9:30 for surgical clearance. [place], CMA

## 2013-11-27 ENCOUNTER — Encounter: Payer: Self-pay | Admitting: Pulmonary Disease

## 2013-11-27 ENCOUNTER — Ambulatory Visit (INDEPENDENT_AMBULATORY_CARE_PROVIDER_SITE_OTHER): Payer: Medicare Other | Admitting: Pulmonary Disease

## 2013-11-27 VITALS — BP 110/66 | HR 81 | Temp 98.0°F | Ht 74.0 in | Wt 238.6 lb

## 2013-11-27 DIAGNOSIS — J439 Emphysema, unspecified: Secondary | ICD-10-CM

## 2013-11-27 DIAGNOSIS — J438 Other emphysema: Secondary | ICD-10-CM

## 2013-11-27 NOTE — Patient Instructions (Signed)
Stay on advair, and take tudorza everyday leading up to your surgery. I will send a note to your urologist for clearance Can try salivart over the counter for dryness Keep previously scheduled apptm with me.

## 2013-11-27 NOTE — Progress Notes (Signed)
   Subjective:    Patient ID: Grant Santos, male    DOB: 1945/10/03, 68 y.o.   MRN: [id number]  HPI Patient comes in today for preop pulmonary clearance for upcoming TURP. He has done very well from a pulmonary standpoint, and has had no recent acute exacerbation. He denies any cough, congestion, or purulence at this time.  He is not using his rescue inhaler very frequently. He was unable to afford tudorza on a regular basis, but uses it on "bad days".   Review of Systems  Constitutional: Negative for fever and unexpected weight change.  HENT: Negative for congestion, dental problem, ear pain, nosebleeds, postnasal drip, rhinorrhea, sinus pressure, sneezing, sore throat and trouble swallowing.   Eyes: Negative for redness and itching.  Respiratory: Positive for cough and shortness of breath. Negative for chest tightness and wheezing.   Cardiovascular: Negative for palpitations and leg swelling.  Gastrointestinal: Negative for nausea and vomiting.  Genitourinary: Negative for dysuria.  Musculoskeletal: Negative for joint swelling.  Skin: Negative for rash.  Neurological: Negative for headaches.  Hematological: Does not bruise/bleed easily.  Psychiatric/Behavioral: Negative for dysphoric mood. The patient is not nervous/anxious.        Objective:   Physical Exam Developed male in no acute distress Nose without purulence or discharge noted Neck without lymphadenopathy or thyromegaly Chest with clear breath sounds, no wheezing Cardiac exam with regular rate and rhythm Right lower extremities with minimal edema, no cyanosis Alert and oriented, moves all 4 extremities.       Assessment & Plan:

## 2013-11-27 NOTE — Assessment & Plan Note (Signed)
The patient is doing fairly well from a COPD standpoint, and I see no reason why he cannot have his upcoming surgery. He is at moderate increased risk for postop pulmonary complications given his COPD, but I suspect he will do fine. I've given him samples of tudorza today to use on a consistent basis up until his surgery so that he is fully optimized.

## 2013-12-09 ENCOUNTER — Other Ambulatory Visit: Payer: Self-pay | Admitting: Pulmonary Disease

## 2014-01-11 ENCOUNTER — Other Ambulatory Visit: Payer: Self-pay | Admitting: Pulmonary Disease

## 2014-01-25 ENCOUNTER — Ambulatory Visit: Payer: Medicare Other | Admitting: Pulmonary Disease

## 2014-02-09 ENCOUNTER — Other Ambulatory Visit: Payer: Self-pay | Admitting: Pulmonary Disease

## 2014-03-22 ENCOUNTER — Encounter: Payer: Self-pay | Admitting: Pulmonary Disease

## 2014-03-22 ENCOUNTER — Ambulatory Visit (INDEPENDENT_AMBULATORY_CARE_PROVIDER_SITE_OTHER): Payer: Medicare Other | Admitting: Pulmonary Disease

## 2014-03-22 VITALS — BP 122/62 | HR 74 | Temp 97.0°F | Ht 74.0 in | Wt 238.4 lb

## 2014-03-22 DIAGNOSIS — J439 Emphysema, unspecified: Secondary | ICD-10-CM

## 2014-03-22 NOTE — Patient Instructions (Signed)
Continue on current medications Stay on cpap, and keep up with mask changes and supplies. Make sure you get you flu shot from your primary followup with me again in one year, but let me know if your breathing/oxygen level worsens.

## 2014-03-22 NOTE — Progress Notes (Signed)
   Subjective:    Patient ID: Grant Santos, male    DOB: Apr 03, 1946, 68 y.o.   MRN: [id number]  HPI The patient comes in today for followup of his known COPD and also obstructive sleep apnea. He has not had an acute exacerbation since his last visit, and feels that his exertional tolerance is at baseline. He has recently purchased a pulse oximeter, and notes that his oxygen level falls into the 80s with heavier exertional activities.  He has been staying on his bronchodilator regimen, and denies any cough or mucus production. He has also been staying on his CPAP, and feels that he sleeps fairly well with the device.   Review of Systems  Constitutional: Negative for fever and unexpected weight change.  HENT: Negative for congestion, dental problem, ear pain, nosebleeds, postnasal drip, rhinorrhea, sinus pressure, sneezing, sore throat and trouble swallowing.   Eyes: Negative for redness and itching.  Respiratory: Positive for cough, chest tightness, shortness of breath and wheezing.   Cardiovascular: Negative for palpitations and leg swelling.  Gastrointestinal: Negative for nausea and vomiting.  Genitourinary: Negative for dysuria.  Musculoskeletal: Negative for joint swelling.  Skin: Negative for rash.  Neurological: Negative for headaches.  Hematological: Does not bruise/bleed easily.  Psychiatric/Behavioral: Negative for dysphoric mood. The patient is not nervous/anxious.        Objective:   Physical Exam Overweight male in no acute distress Nose without purulence or discharge noted Neck without lymphadenopathy or thyromegaly Chest with totally clear breath sounds, no wheezing Cardiac exam with regular rate and rhythm Right lower extremity with mild edema, no cyanosis Alert and oriented, moves all 4 extremities.       Assessment & Plan:

## 2014-03-22 NOTE — Assessment & Plan Note (Signed)
The patient is staying on his bronchodilator regimen, and feels that his exertional tolerance is near his usual baseline. He is having oxygen desaturation at times while at home with exertion, but he did not desaturate today in the office. I have asked him to continue on his current regimen, and to stay as active as possible.

## 2014-04-25 ENCOUNTER — Other Ambulatory Visit: Payer: Self-pay | Admitting: Pulmonary Disease

## 2014-05-20 ENCOUNTER — Encounter: Payer: Self-pay | Admitting: Pulmonary Disease

## 2014-05-20 ENCOUNTER — Ambulatory Visit (INDEPENDENT_AMBULATORY_CARE_PROVIDER_SITE_OTHER): Payer: Medicare Other | Admitting: Pulmonary Disease

## 2014-05-20 VITALS — BP 128/64 | HR 90 | Temp 97.1°F | Ht 74.0 in | Wt 215.8 lb

## 2014-05-20 DIAGNOSIS — J438 Other emphysema: Secondary | ICD-10-CM

## 2014-05-20 DIAGNOSIS — J189 Pneumonia, unspecified organism: Secondary | ICD-10-CM

## 2014-05-20 HISTORY — DX: Pneumonia, unspecified organism: J18.9

## 2014-05-20 NOTE — Progress Notes (Signed)
   Subjective:    Patient ID: Grant Santos, male    DOB: Jan 29, 1946, 68 y.o.   MRN: [id number]  HPI Patient comes in today for a post hospital follow-up. He has known COPD, and was recently admitted about 3 weeks ago with a right lower lobe and left lower lobe pneumonia. He had significant cough, congestion, and purulence at the time. He also had fever. He was admitted to the hospital and treated with IV antibiotics, and discharged home on oral antibiotics. The patient feels that he is much better, and is no longer producing purulent mucus. However, he has not gotten back to his usual baseline. He has had a follow-up chest x-ray approximately 5 days ago, and this shows improvement, but he does have a persistent infiltrate in the superior segment of the right lower lobe. He denies any fevers, chills, or sweats.   Review of Systems  Constitutional: Negative for fever and unexpected weight change.  HENT: Positive for postnasal drip. Negative for congestion, dental problem, ear pain, nosebleeds, rhinorrhea, sinus pressure, sneezing, sore throat and trouble swallowing.   Eyes: Negative for redness and itching.  Respiratory: Positive for cough, chest tightness and shortness of breath. Negative for wheezing.   Cardiovascular: Positive for leg swelling. Negative for palpitations.  Gastrointestinal: Negative for nausea and vomiting.  Genitourinary: Negative for dysuria.  Musculoskeletal: Negative for joint swelling.  Skin: Negative for rash.  Neurological: Negative for headaches.  Hematological: Does not bruise/bleed easily.  Psychiatric/Behavioral: Negative for dysphoric mood. The patient is not nervous/anxious.        Objective:   Physical Exam Well-developed male in no acute distress Nose without purulence or discharge noted Neck without lymphadenopathy or thyromegaly Chest with a few basilar crackles, no wheezing noted Cardiac exam with regular rate and rhythm Right lower extremity with  minimal edema, no cyanosis Alert and oriented, moves all 4 extremities.       Assessment & Plan:

## 2014-05-20 NOTE — Assessment & Plan Note (Signed)
The patient developed community-acquired pneumonia the middle of November, and this was associated with purulent mucus, fever, and a significant superior segment right lower lobe infiltrate and left basilar infiltrate. He has been hospitalized and treated with antibiotics, and has finished up his course of oral antibiotics after discharge. He is clearly much improved, with very little cough or congestion. However, his chest x-ray from a week ago shows persistent abnormal density in the superior segment of the right lower lobe, though clearly better. I have told the patient that we need to follow this to complete clearance, especially in light of his prior smoking history. I would like to see him back in about a month or so and do a follow-up chest x-ray. If abnormal, he will need a CT chest.

## 2014-05-20 NOTE — Assessment & Plan Note (Signed)
The patient has done fairly well from a COPD standpoint through all of this. He feels that his breathing is almost back to baseline.

## 2014-05-20 NOTE — Patient Instructions (Signed)
Will see you back the middle to end of January, and will do a chest xray the same day to check on things. Let us know if you feel you are not doing well.

## 2014-06-22 DIAGNOSIS — Z7901 Long term (current) use of anticoagulants: Secondary | ICD-10-CM | POA: Diagnosis not present

## 2014-06-24 ENCOUNTER — Ambulatory Visit: Payer: Medicare Other | Admitting: Pulmonary Disease

## 2014-06-28 DIAGNOSIS — G8922 Chronic post-thoracotomy pain: Secondary | ICD-10-CM | POA: Diagnosis not present

## 2014-06-28 DIAGNOSIS — M47816 Spondylosis without myelopathy or radiculopathy, lumbar region: Secondary | ICD-10-CM | POA: Diagnosis not present

## 2014-06-28 DIAGNOSIS — G894 Chronic pain syndrome: Secondary | ICD-10-CM | POA: Diagnosis not present

## 2014-06-28 DIAGNOSIS — Z79891 Long term (current) use of opiate analgesic: Secondary | ICD-10-CM | POA: Diagnosis not present

## 2014-07-02 DIAGNOSIS — J189 Pneumonia, unspecified organism: Secondary | ICD-10-CM | POA: Diagnosis not present

## 2014-07-02 DIAGNOSIS — I509 Heart failure, unspecified: Secondary | ICD-10-CM | POA: Diagnosis not present

## 2014-07-02 DIAGNOSIS — M545 Low back pain: Secondary | ICD-10-CM | POA: Diagnosis not present

## 2014-07-02 DIAGNOSIS — Z7982 Long term (current) use of aspirin: Secondary | ICD-10-CM | POA: Diagnosis not present

## 2014-07-02 DIAGNOSIS — Z888 Allergy status to other drugs, medicaments and biological substances status: Secondary | ICD-10-CM | POA: Diagnosis not present

## 2014-07-02 DIAGNOSIS — I25110 Atherosclerotic heart disease of native coronary artery with unstable angina pectoris: Secondary | ICD-10-CM | POA: Diagnosis not present

## 2014-07-02 DIAGNOSIS — R591 Generalized enlarged lymph nodes: Secondary | ICD-10-CM | POA: Diagnosis not present

## 2014-07-02 DIAGNOSIS — R918 Other nonspecific abnormal finding of lung field: Secondary | ICD-10-CM | POA: Diagnosis not present

## 2014-07-02 DIAGNOSIS — G8929 Other chronic pain: Secondary | ICD-10-CM | POA: Diagnosis not present

## 2014-07-02 DIAGNOSIS — R531 Weakness: Secondary | ICD-10-CM | POA: Diagnosis not present

## 2014-07-02 DIAGNOSIS — I340 Nonrheumatic mitral (valve) insufficiency: Secondary | ICD-10-CM | POA: Diagnosis not present

## 2014-07-02 DIAGNOSIS — J9600 Acute respiratory failure, unspecified whether with hypoxia or hypercapnia: Secondary | ICD-10-CM | POA: Diagnosis not present

## 2014-07-02 DIAGNOSIS — I10 Essential (primary) hypertension: Secondary | ICD-10-CM | POA: Diagnosis not present

## 2014-07-02 DIAGNOSIS — E780 Pure hypercholesterolemia: Secondary | ICD-10-CM | POA: Diagnosis not present

## 2014-07-02 DIAGNOSIS — J159 Unspecified bacterial pneumonia: Secondary | ICD-10-CM | POA: Diagnosis not present

## 2014-07-02 DIAGNOSIS — Z951 Presence of aortocoronary bypass graft: Secondary | ICD-10-CM | POA: Diagnosis not present

## 2014-07-02 DIAGNOSIS — Z885 Allergy status to narcotic agent status: Secondary | ICD-10-CM | POA: Diagnosis not present

## 2014-07-02 DIAGNOSIS — I361 Nonrheumatic tricuspid (valve) insufficiency: Secondary | ICD-10-CM | POA: Diagnosis not present

## 2014-07-02 DIAGNOSIS — R0902 Hypoxemia: Secondary | ICD-10-CM | POA: Diagnosis not present

## 2014-07-02 DIAGNOSIS — J9601 Acute respiratory failure with hypoxia: Secondary | ICD-10-CM | POA: Diagnosis not present

## 2014-07-02 DIAGNOSIS — J811 Chronic pulmonary edema: Secondary | ICD-10-CM | POA: Diagnosis not present

## 2014-07-02 DIAGNOSIS — I252 Old myocardial infarction: Secondary | ICD-10-CM | POA: Diagnosis not present

## 2014-07-02 DIAGNOSIS — I4891 Unspecified atrial fibrillation: Secondary | ICD-10-CM | POA: Diagnosis not present

## 2014-07-02 DIAGNOSIS — Z89612 Acquired absence of left leg above knee: Secondary | ICD-10-CM | POA: Diagnosis not present

## 2014-07-02 DIAGNOSIS — Z87891 Personal history of nicotine dependence: Secondary | ICD-10-CM | POA: Diagnosis not present

## 2014-07-02 DIAGNOSIS — I742 Embolism and thrombosis of arteries of the upper extremities: Secondary | ICD-10-CM | POA: Diagnosis not present

## 2014-07-02 DIAGNOSIS — J439 Emphysema, unspecified: Secondary | ICD-10-CM | POA: Diagnosis not present

## 2014-07-02 DIAGNOSIS — I517 Cardiomegaly: Secondary | ICD-10-CM | POA: Diagnosis not present

## 2014-07-02 DIAGNOSIS — I712 Thoracic aortic aneurysm, without rupture: Secondary | ICD-10-CM | POA: Diagnosis not present

## 2014-07-02 DIAGNOSIS — E874 Mixed disorder of acid-base balance: Secondary | ICD-10-CM | POA: Diagnosis not present

## 2014-07-02 DIAGNOSIS — J441 Chronic obstructive pulmonary disease with (acute) exacerbation: Secondary | ICD-10-CM | POA: Diagnosis not present

## 2014-07-02 DIAGNOSIS — I480 Paroxysmal atrial fibrillation: Secondary | ICD-10-CM | POA: Diagnosis not present

## 2014-07-02 DIAGNOSIS — I482 Chronic atrial fibrillation: Secondary | ICD-10-CM | POA: Diagnosis not present

## 2014-07-02 DIAGNOSIS — I200 Unstable angina: Secondary | ICD-10-CM | POA: Diagnosis not present

## 2014-07-02 DIAGNOSIS — R069 Unspecified abnormalities of breathing: Secondary | ICD-10-CM | POA: Diagnosis not present

## 2014-07-03 ENCOUNTER — Inpatient Hospital Stay (HOSPITAL_COMMUNITY): Payer: Medicare Other | Admitting: Anesthesiology

## 2014-07-03 ENCOUNTER — Encounter (HOSPITAL_COMMUNITY): Payer: Self-pay | Admitting: Physician Assistant

## 2014-07-03 ENCOUNTER — Inpatient Hospital Stay (HOSPITAL_COMMUNITY): Payer: Medicare Other

## 2014-07-03 ENCOUNTER — Inpatient Hospital Stay (HOSPITAL_COMMUNITY)
Admission: EM | Admit: 2014-07-03 | Discharge: 2014-07-12 | DRG: 252 | Disposition: A | Discharge status: Home Health | Payer: Medicare Other | Source: Other Acute Inpatient Hospital | Attending: Internal Medicine | Admitting: Internal Medicine

## 2014-07-03 ENCOUNTER — Encounter (HOSPITAL_COMMUNITY)
Admission: EM | Disposition: A | Discharge status: Home Health | Payer: Self-pay | Source: Other Acute Inpatient Hospital | Attending: Internal Medicine

## 2014-07-03 DIAGNOSIS — J8410 Pulmonary fibrosis, unspecified: Secondary | ICD-10-CM | POA: Diagnosis present

## 2014-07-03 DIAGNOSIS — I481 Persistent atrial fibrillation: Secondary | ICD-10-CM | POA: Diagnosis present

## 2014-07-03 DIAGNOSIS — J439 Emphysema, unspecified: Secondary | ICD-10-CM | POA: Diagnosis present

## 2014-07-03 DIAGNOSIS — J189 Pneumonia, unspecified organism: Secondary | ICD-10-CM | POA: Diagnosis not present

## 2014-07-03 DIAGNOSIS — Z981 Arthrodesis status: Secondary | ICD-10-CM | POA: Diagnosis not present

## 2014-07-03 DIAGNOSIS — Z87891 Personal history of nicotine dependence: Secondary | ICD-10-CM | POA: Diagnosis not present

## 2014-07-03 DIAGNOSIS — J9621 Acute and chronic respiratory failure with hypoxia: Secondary | ICD-10-CM | POA: Diagnosis present

## 2014-07-03 DIAGNOSIS — I82622 Acute embolism and thrombosis of deep veins of left upper extremity: Secondary | ICD-10-CM | POA: Diagnosis not present

## 2014-07-03 DIAGNOSIS — I70208 Unspecified atherosclerosis of native arteries of extremities, other extremity: Secondary | ICD-10-CM | POA: Diagnosis present

## 2014-07-03 DIAGNOSIS — I82629 Acute embolism and thrombosis of deep veins of unspecified upper extremity: Secondary | ICD-10-CM | POA: Insufficient documentation

## 2014-07-03 DIAGNOSIS — J449 Chronic obstructive pulmonary disease, unspecified: Secondary | ICD-10-CM

## 2014-07-03 DIAGNOSIS — I10 Essential (primary) hypertension: Secondary | ICD-10-CM | POA: Diagnosis not present

## 2014-07-03 DIAGNOSIS — F1120 Opioid dependence, uncomplicated: Secondary | ICD-10-CM | POA: Diagnosis not present

## 2014-07-03 DIAGNOSIS — Z79899 Other long term (current) drug therapy: Secondary | ICD-10-CM

## 2014-07-03 DIAGNOSIS — J9601 Acute respiratory failure with hypoxia: Secondary | ICD-10-CM | POA: Diagnosis not present

## 2014-07-03 DIAGNOSIS — Z7901 Long term (current) use of anticoagulants: Secondary | ICD-10-CM | POA: Diagnosis not present

## 2014-07-03 DIAGNOSIS — M79602 Pain in left arm: Secondary | ICD-10-CM | POA: Diagnosis present

## 2014-07-03 DIAGNOSIS — R300 Dysuria: Secondary | ICD-10-CM | POA: Diagnosis not present

## 2014-07-03 DIAGNOSIS — I712 Thoracic aortic aneurysm, without rupture: Secondary | ICD-10-CM | POA: Diagnosis not present

## 2014-07-03 DIAGNOSIS — G894 Chronic pain syndrome: Secondary | ICD-10-CM | POA: Diagnosis present

## 2014-07-03 DIAGNOSIS — I480 Paroxysmal atrial fibrillation: Secondary | ICD-10-CM | POA: Diagnosis not present

## 2014-07-03 DIAGNOSIS — R918 Other nonspecific abnormal finding of lung field: Secondary | ICD-10-CM

## 2014-07-03 DIAGNOSIS — J441 Chronic obstructive pulmonary disease with (acute) exacerbation: Secondary | ICD-10-CM | POA: Diagnosis present

## 2014-07-03 DIAGNOSIS — Z419 Encounter for procedure for purposes other than remedying health state, unspecified: Secondary | ICD-10-CM

## 2014-07-03 DIAGNOSIS — J159 Unspecified bacterial pneumonia: Secondary | ICD-10-CM | POA: Diagnosis not present

## 2014-07-03 DIAGNOSIS — Z6827 Body mass index (BMI) 27.0-27.9, adult: Secondary | ICD-10-CM

## 2014-07-03 DIAGNOSIS — J849 Interstitial pulmonary disease, unspecified: Secondary | ICD-10-CM | POA: Diagnosis not present

## 2014-07-03 DIAGNOSIS — R0602 Shortness of breath: Secondary | ICD-10-CM

## 2014-07-03 DIAGNOSIS — Z7982 Long term (current) use of aspirin: Secondary | ICD-10-CM | POA: Diagnosis not present

## 2014-07-03 DIAGNOSIS — D72829 Elevated white blood cell count, unspecified: Secondary | ICD-10-CM | POA: Diagnosis present

## 2014-07-03 DIAGNOSIS — I4891 Unspecified atrial fibrillation: Secondary | ICD-10-CM

## 2014-07-03 DIAGNOSIS — Z951 Presence of aortocoronary bypass graft: Secondary | ICD-10-CM | POA: Diagnosis not present

## 2014-07-03 DIAGNOSIS — Z9889 Other specified postprocedural states: Secondary | ICD-10-CM | POA: Diagnosis not present

## 2014-07-03 DIAGNOSIS — R0600 Dyspnea, unspecified: Secondary | ICD-10-CM | POA: Diagnosis not present

## 2014-07-03 DIAGNOSIS — I748 Embolism and thrombosis of other arteries: Secondary | ICD-10-CM | POA: Diagnosis not present

## 2014-07-03 DIAGNOSIS — R791 Abnormal coagulation profile: Secondary | ICD-10-CM | POA: Diagnosis not present

## 2014-07-03 DIAGNOSIS — I4819 Other persistent atrial fibrillation: Secondary | ICD-10-CM | POA: Insufficient documentation

## 2014-07-03 DIAGNOSIS — Z89022 Acquired absence of left finger(s): Secondary | ICD-10-CM | POA: Diagnosis not present

## 2014-07-03 DIAGNOSIS — J45909 Unspecified asthma, uncomplicated: Secondary | ICD-10-CM | POA: Diagnosis not present

## 2014-07-03 DIAGNOSIS — I499 Cardiac arrhythmia, unspecified: Secondary | ICD-10-CM | POA: Diagnosis not present

## 2014-07-03 DIAGNOSIS — Z89512 Acquired absence of left leg below knee: Secondary | ICD-10-CM

## 2014-07-03 DIAGNOSIS — E876 Hypokalemia: Secondary | ICD-10-CM | POA: Diagnosis not present

## 2014-07-03 DIAGNOSIS — I742 Embolism and thrombosis of arteries of the upper extremities: Secondary | ICD-10-CM | POA: Diagnosis not present

## 2014-07-03 DIAGNOSIS — I2510 Atherosclerotic heart disease of native coronary artery without angina pectoris: Secondary | ICD-10-CM | POA: Diagnosis not present

## 2014-07-03 DIAGNOSIS — E43 Unspecified severe protein-calorie malnutrition: Secondary | ICD-10-CM | POA: Diagnosis not present

## 2014-07-03 DIAGNOSIS — G4733 Obstructive sleep apnea (adult) (pediatric): Secondary | ICD-10-CM | POA: Diagnosis not present

## 2014-07-03 DIAGNOSIS — I482 Chronic atrial fibrillation: Secondary | ICD-10-CM | POA: Diagnosis not present

## 2014-07-03 DIAGNOSIS — J438 Other emphysema: Secondary | ICD-10-CM

## 2014-07-03 DIAGNOSIS — J984 Other disorders of lung: Secondary | ICD-10-CM | POA: Diagnosis not present

## 2014-07-03 DIAGNOSIS — J9600 Acute respiratory failure, unspecified whether with hypoxia or hypercapnia: Secondary | ICD-10-CM | POA: Diagnosis not present

## 2014-07-03 DIAGNOSIS — I998 Other disorder of circulatory system: Secondary | ICD-10-CM | POA: Diagnosis not present

## 2014-07-03 HISTORY — DX: Unspecified atrial fibrillation: I48.91

## 2014-07-03 HISTORY — PX: EMBOLECTOMY: SHX44

## 2014-07-03 HISTORY — DX: Unspecified atherosclerosis of native arteries of extremities, other extremity: I70.208

## 2014-07-03 HISTORY — DX: Acute and chronic respiratory failure with hypoxia: J96.21

## 2014-07-03 HISTORY — DX: Essential (primary) hypertension: I10

## 2014-07-03 HISTORY — DX: Unspecified asthma, uncomplicated: J45.909

## 2014-07-03 HISTORY — DX: Systemic involvement of connective tissue, unspecified: M35.9

## 2014-07-03 LAB — TYPE AND SCREEN
ABO/RH(D): B POS
Antibody Screen: NEGATIVE

## 2014-07-03 LAB — CBC WITH DIFFERENTIAL/PLATELET
BASOS ABS: 0 10*3/uL (ref 0.0–0.1)
BASOS PCT: 0 % (ref 0–1)
EOS PCT: 0 % (ref 0–5)
Eosinophils Absolute: 0 10*3/uL (ref 0.0–0.7)
HCT: 36.3 % — ABNORMAL LOW (ref 39.0–52.0)
Hemoglobin: 11.9 g/dL — ABNORMAL LOW (ref 13.0–17.0)
LYMPHS PCT: 6 % — AB (ref 12–46)
Lymphs Abs: 1.1 10*3/uL (ref 0.7–4.0)
MCH: 28.1 pg (ref 26.0–34.0)
MCHC: 32.8 g/dL (ref 30.0–36.0)
MCV: 85.8 fL (ref 78.0–100.0)
Monocytes Absolute: 0.9 10*3/uL (ref 0.1–1.0)
Monocytes Relative: 5 % (ref 3–12)
Neutro Abs: 16.3 10*3/uL — ABNORMAL HIGH (ref 1.7–7.7)
Neutrophils Relative %: 89 % — ABNORMAL HIGH (ref 43–77)
Platelets: 265 10*3/uL (ref 150–400)
RBC: 4.23 MIL/uL (ref 4.22–5.81)
RDW: 13.9 % (ref 11.5–15.5)
WBC: 18.3 10*3/uL — AB (ref 4.0–10.5)

## 2014-07-03 LAB — PROTIME-INR
INR: 2.72 — ABNORMAL HIGH (ref 0.00–1.49)
INR: 2.72 — AB (ref 0.00–1.49)
Prothrombin Time: 29.1 seconds — ABNORMAL HIGH (ref 11.6–15.2)
Prothrombin Time: 29.1 seconds — ABNORMAL HIGH (ref 11.6–15.2)

## 2014-07-03 LAB — POCT I-STAT, CHEM 8
BUN: 24 mg/dL — AB (ref 6–23)
CALCIUM ION: 1.11 mmol/L — AB (ref 1.13–1.30)
Chloride: 98 mmol/L (ref 96–112)
Creatinine, Ser: 0.7 mg/dL (ref 0.50–1.35)
Glucose, Bld: 137 mg/dL — ABNORMAL HIGH (ref 70–99)
HCT: 40 % (ref 39.0–52.0)
Hemoglobin: 13.6 g/dL (ref 13.0–17.0)
POTASSIUM: 3.9 mmol/L (ref 3.5–5.1)
SODIUM: 135 mmol/L (ref 135–145)
TCO2: 24 mmol/L (ref 0–100)

## 2014-07-03 LAB — COMPREHENSIVE METABOLIC PANEL
ALT: 30 U/L (ref 0–53)
AST: 45 U/L — ABNORMAL HIGH (ref 0–37)
Albumin: 2.7 g/dL — ABNORMAL LOW (ref 3.5–5.2)
Alkaline Phosphatase: 135 U/L — ABNORMAL HIGH (ref 39–117)
Anion gap: 11 (ref 5–15)
BILIRUBIN TOTAL: 0.9 mg/dL (ref 0.3–1.2)
BUN: 22 mg/dL (ref 6–23)
CO2: 29 mmol/L (ref 19–32)
CREATININE: 0.84 mg/dL (ref 0.50–1.35)
Calcium: 9.2 mg/dL (ref 8.4–10.5)
Chloride: 97 mmol/L (ref 96–112)
GFR, EST NON AFRICAN AMERICAN: 89 mL/min — AB (ref >90)
Glucose, Bld: 138 mg/dL — ABNORMAL HIGH (ref 70–99)
Potassium: 4 mmol/L (ref 3.5–5.1)
Sodium: 137 mmol/L (ref 135–145)
Total Protein: 6.3 g/dL (ref 6.0–8.3)

## 2014-07-03 LAB — ABO/RH: ABO/RH(D): B POS

## 2014-07-03 LAB — MRSA PCR SCREENING: MRSA by PCR: NEGATIVE

## 2014-07-03 SURGERY — EMBOLECTOMY
Anesthesia: Monitor Anesthesia Care | Site: Arm Lower | Laterality: Left

## 2014-07-03 MED ORDER — METHYLPREDNISOLONE SODIUM SUCC 125 MG IJ SOLR
60.0000 mg | Freq: Four times a day (QID) | INTRAMUSCULAR | Status: DC
  Administered 2014-07-04 – 2014-07-06 (×11): 60 mg via INTRAVENOUS
  Filled 2014-07-03 (×2): qty 0.96
  Filled 2014-07-03: qty 2
  Filled 2014-07-03 (×8): qty 0.96
  Filled 2014-07-03 (×2): qty 2
  Filled 2014-07-03 (×2): qty 0.96

## 2014-07-03 MED ORDER — MOMETASONE FURO-FORMOTEROL FUM 100-5 MCG/ACT IN AERO
2.0000 | INHALATION_SPRAY | Freq: Two times a day (BID) | RESPIRATORY_TRACT | Status: DC
  Administered 2014-07-04 – 2014-07-05 (×3): 2 via RESPIRATORY_TRACT
  Filled 2014-07-03: qty 8.8

## 2014-07-03 MED ORDER — LEVALBUTEROL HCL 1.25 MG/3ML IN NEBU
1.2500 mg | INHALATION_SOLUTION | Freq: Four times a day (QID) | RESPIRATORY_TRACT | Status: DC
  Administered 2014-07-03: 1.25 mg via RESPIRATORY_TRACT
  Filled 2014-07-03 (×4): qty 3

## 2014-07-03 MED ORDER — BETHANECHOL CHLORIDE 25 MG PO TABS
25.0000 mg | ORAL_TABLET | Freq: Three times a day (TID) | ORAL | Status: DC
  Administered 2014-07-03 – 2014-07-12 (×26): 25 mg via ORAL
  Filled 2014-07-03 (×29): qty 1

## 2014-07-03 MED ORDER — IPRATROPIUM BROMIDE 0.02 % IN SOLN
RESPIRATORY_TRACT | Status: AC
  Filled 2014-07-03: qty 2.5

## 2014-07-03 MED ORDER — THROMBIN 20000 UNITS EX SOLR
CUTANEOUS | Status: AC
  Filled 2014-07-03: qty 20000

## 2014-07-03 MED ORDER — PROPOFOL 10 MG/ML IV BOLUS
INTRAVENOUS | Status: DC | PRN
  Administered 2014-07-03 (×4): 10 mg via INTRAVENOUS

## 2014-07-03 MED ORDER — LIDOCAINE HCL (PF) 1 % IJ SOLN
INTRAMUSCULAR | Status: AC
  Filled 2014-07-03: qty 30

## 2014-07-03 MED ORDER — ACETAMINOPHEN 650 MG RE SUPP: 650.0000 mg | Freq: Four times a day (QID) | RECTAL | Status: DC | PRN

## 2014-07-03 MED ORDER — DOCUSATE SODIUM 100 MG PO CAPS
100.0000 mg | ORAL_CAPSULE | Freq: Every day | ORAL | Status: DC
  Administered 2014-07-04 – 2014-07-11 (×6): 100 mg via ORAL
  Filled 2014-07-03 (×10): qty 1

## 2014-07-03 MED ORDER — LACTATED RINGERS IV SOLN
INTRAVENOUS | Status: DC | PRN
  Administered 2014-07-03: 17:00:00 via INTRAVENOUS

## 2014-07-03 MED ORDER — POTASSIUM CHLORIDE CRYS ER 20 MEQ PO TBCR
20.0000 meq | EXTENDED_RELEASE_TABLET | Freq: Every day | ORAL | Status: DC
  Administered 2014-07-04 – 2014-07-12 (×8): 20 meq via ORAL
  Filled 2014-07-03 (×13): qty 1

## 2014-07-03 MED ORDER — FENTANYL CITRATE 0.05 MG/ML IJ SOLN
INTRAMUSCULAR | Status: AC
  Filled 2014-07-03: qty 5

## 2014-07-03 MED ORDER — DEXTROSE 5 % IV SOLN
500.0000 mg | INTRAVENOUS | Status: DC
  Administered 2014-07-04: 500 mg via INTRAVENOUS
  Filled 2014-07-03 (×3): qty 500

## 2014-07-03 MED ORDER — ALUM & MAG HYDROXIDE-SIMETH 200-200-20 MG/5ML PO SUSP
30.0000 mL | Freq: Four times a day (QID) | ORAL | Status: DC | PRN
  Administered 2014-07-09 – 2014-07-12 (×4): 30 mL via ORAL
  Filled 2014-07-03 (×4): qty 30

## 2014-07-03 MED ORDER — PHENOL 1.4 % MT LIQD: 1.0000 | OROMUCOSAL | Status: DC | PRN

## 2014-07-03 MED ORDER — ACETAMINOPHEN 325 MG PO TABS: 650.0000 mg | ORAL_TABLET | Freq: Four times a day (QID) | ORAL | Status: DC | PRN

## 2014-07-03 MED ORDER — HEPARIN (PORCINE) IN NACL 100-0.45 UNIT/ML-% IJ SOLN
1600.0000 [IU]/h | INTRAMUSCULAR | Status: DC
  Administered 2014-07-04: 1000 [IU]/h via INTRAVENOUS
  Administered 2014-07-06: 1600 [IU]/h via INTRAVENOUS
  Filled 2014-07-03 (×6): qty 250

## 2014-07-03 MED ORDER — THROMBIN 20000 UNITS EX KIT
PACK | CUTANEOUS | Status: DC | PRN
  Administered 2014-07-03: 20 mL via TOPICAL

## 2014-07-03 MED ORDER — SODIUM CHLORIDE 0.9 % IV SOLN
Freq: Once | INTRAVENOUS | Status: AC
  Administered 2014-07-03: 22:00:00 via INTRAVENOUS

## 2014-07-03 MED ORDER — HEMOSTATIC AGENTS (NO CHARGE) OPTIME: TOPICAL | Status: DC | PRN

## 2014-07-03 MED ORDER — HYDROMORPHONE HCL 1 MG/ML IJ SOLN
0.2500 mg | INTRAMUSCULAR | Status: DC | PRN
Start: 2014-07-03 — End: 2014-07-03
  Administered 2014-07-03: 0.5 mg via INTRAVENOUS

## 2014-07-03 MED ORDER — GUAIFENESIN-DM 100-10 MG/5ML PO SYRP: 15.0000 mL | ORAL_SOLUTION | ORAL | Status: DC | PRN

## 2014-07-03 MED ORDER — CEFTRIAXONE SODIUM IN DEXTROSE 20 MG/ML IV SOLN
1.0000 g | INTRAVENOUS | Status: DC
  Administered 2014-07-04: 1 g via INTRAVENOUS
  Filled 2014-07-03 (×3): qty 50

## 2014-07-03 MED ORDER — DEXTROSE 5 % IV SOLN
1.5000 g | Freq: Once | INTRAVENOUS | Status: AC
  Administered 2014-07-04: 1.5 g via INTRAVENOUS
  Filled 2014-07-03: qty 1.5

## 2014-07-03 MED ORDER — SODIUM CHLORIDE 0.9 % IJ SOLN
3.0000 mL | Freq: Two times a day (BID) | INTRAMUSCULAR | Status: DC
Start: 2014-07-03 — End: 2014-07-09
  Administered 2014-07-04 – 2014-07-08 (×7): 3 mL via INTRAVENOUS

## 2014-07-03 MED ORDER — ONDANSETRON HCL 4 MG/2ML IJ SOLN: 4.0000 mg | Freq: Once | INTRAMUSCULAR | Status: DC | PRN

## 2014-07-03 MED ORDER — ONDANSETRON HCL 4 MG/2ML IJ SOLN: 4.0000 mg | Freq: Four times a day (QID) | INTRAMUSCULAR | Status: DC | PRN

## 2014-07-03 MED ORDER — GABAPENTIN 600 MG PO TABS
600.0000 mg | ORAL_TABLET | Freq: Two times a day (BID) | ORAL | Status: DC
  Administered 2014-07-03 – 2014-07-12 (×18): 600 mg via ORAL
  Filled 2014-07-03 (×22): qty 1

## 2014-07-03 MED ORDER — FENTANYL CITRATE 0.05 MG/ML IJ SOLN
INTRAMUSCULAR | Status: DC | PRN
  Administered 2014-07-03: 25 ug via INTRAVENOUS
  Administered 2014-07-03 (×2): 12.5 ug via INTRAVENOUS
  Administered 2014-07-03: 25 ug via INTRAVENOUS
  Administered 2014-07-03 (×2): 12.5 ug via INTRAVENOUS

## 2014-07-03 MED ORDER — SODIUM CHLORIDE 0.9 % IJ SOLN: 3.0000 mL | INTRAMUSCULAR | Status: DC | PRN

## 2014-07-03 MED ORDER — VITAMIN K1 10 MG/ML IJ SOLN
1.0000 mg | Freq: Once | INTRAVENOUS | Status: AC
  Administered 2014-07-04: 1 mg via INTRAVENOUS
  Filled 2014-07-03: qty 0.1

## 2014-07-03 MED ORDER — POTASSIUM CHLORIDE CRYS ER 20 MEQ PO TBCR
20.0000 meq | EXTENDED_RELEASE_TABLET | Freq: Every day | ORAL | Status: AC | PRN
Start: 2014-07-03 — End: 2014-07-07
  Administered 2014-07-07: 20 meq via ORAL

## 2014-07-03 MED ORDER — LIDOCAINE HCL (CARDIAC) 20 MG/ML IV SOLN
INTRAVENOUS | Status: DC | PRN
  Administered 2014-07-03: 30 mg via INTRAVENOUS

## 2014-07-03 MED ORDER — HYDROMORPHONE HCL 1 MG/ML IJ SOLN
INTRAMUSCULAR | Status: AC
  Filled 2014-07-03: qty 1

## 2014-07-03 MED ORDER — DIGOXIN 125 MCG PO TABS
125.0000 ug | ORAL_TABLET | Freq: Every day | ORAL | Status: DC
  Administered 2014-07-04 – 2014-07-12 (×8): 125 ug via ORAL
  Filled 2014-07-03 (×10): qty 1

## 2014-07-03 MED ORDER — SODIUM CHLORIDE 0.9 % IJ SOLN
3.0000 mL | Freq: Two times a day (BID) | INTRAMUSCULAR | Status: DC
Start: 2014-07-03 — End: 2014-07-12
  Administered 2014-07-04 – 2014-07-12 (×11): 3 mL via INTRAVENOUS

## 2014-07-03 MED ORDER — MEPERIDINE HCL 25 MG/ML IJ SOLN: 6.2500 mg | INTRAMUSCULAR | Status: DC | PRN

## 2014-07-03 MED ORDER — SODIUM CHLORIDE 0.9 % IV SOLN: 250.0000 mL | INTRAVENOUS | Status: DC | PRN

## 2014-07-03 MED ORDER — OXYCODONE HCL 5 MG PO TABS
10.0000 mg | ORAL_TABLET | ORAL | Status: DC | PRN
  Administered 2014-07-04: 10 mg via ORAL
  Filled 2014-07-03: qty 2

## 2014-07-03 MED ORDER — SODIUM CHLORIDE 0.9 % IV SOLN
Freq: Once | INTRAVENOUS | Status: AC
  Administered 2014-07-03: 15:00:00 via INTRAVENOUS

## 2014-07-03 MED ORDER — ATORVASTATIN CALCIUM 40 MG PO TABS
40.0000 mg | ORAL_TABLET | Freq: Every day | ORAL | Status: DC
  Administered 2014-07-03 – 2014-07-11 (×9): 40 mg via ORAL
  Filled 2014-07-03 (×10): qty 1

## 2014-07-03 MED ORDER — SODIUM CHLORIDE 0.9 % IR SOLN
Status: DC | PRN
  Administered 2014-07-03: 250 mL

## 2014-07-03 MED ORDER — GUAIFENESIN ER 600 MG PO TB12
1200.0000 mg | ORAL_TABLET | Freq: Two times a day (BID) | ORAL | Status: DC
  Administered 2014-07-03 – 2014-07-08 (×10): 1200 mg via ORAL
  Filled 2014-07-03 (×12): qty 2

## 2014-07-03 MED ORDER — IPRATROPIUM BROMIDE 0.02 % IN SOLN
0.5000 mg | Freq: Four times a day (QID) | RESPIRATORY_TRACT | Status: DC
  Administered 2014-07-03 – 2014-07-04 (×2): 0.5 mg via RESPIRATORY_TRACT
  Filled 2014-07-03 (×2): qty 2.5

## 2014-07-03 MED ORDER — PANTOPRAZOLE SODIUM 40 MG PO TBEC
40.0000 mg | DELAYED_RELEASE_TABLET | Freq: Every day | ORAL | Status: DC
  Administered 2014-07-03 – 2014-07-12 (×10): 40 mg via ORAL
  Filled 2014-07-03 (×9): qty 1

## 2014-07-03 MED ORDER — 0.9 % SODIUM CHLORIDE (POUR BTL) OPTIME
TOPICAL | Status: DC | PRN
  Administered 2014-07-03: 250 mL

## 2014-07-03 MED ORDER — ONDANSETRON HCL 4 MG PO TABS: 4.0000 mg | ORAL_TABLET | Freq: Four times a day (QID) | ORAL | Status: DC | PRN

## 2014-07-03 MED ORDER — HYDROMORPHONE HCL 1 MG/ML IJ SOLN
0.5000 mg | INTRAMUSCULAR | Status: DC | PRN
  Administered 2014-07-04 – 2014-07-09 (×3): 1 mg via INTRAVENOUS
  Filled 2014-07-03 (×3): qty 1

## 2014-07-03 MED ORDER — MIDAZOLAM HCL 5 MG/5ML IJ SOLN
INTRAMUSCULAR | Status: DC | PRN
  Administered 2014-07-03 (×4): 0.5 mg via INTRAVENOUS

## 2014-07-03 MED ORDER — LIDOCAINE HCL (PF) 1 % IJ SOLN
INTRAMUSCULAR | Status: DC | PRN
  Administered 2014-07-03: 15 mL via SUBCUTANEOUS

## 2014-07-03 MED ORDER — DEXTROSE 5 % IV SOLN
1.5000 g | INTRAVENOUS | Status: AC
  Administered 2014-07-03: 1.5 g via INTRAVENOUS
  Filled 2014-07-03 (×2): qty 1.5

## 2014-07-03 MED ORDER — MIDAZOLAM HCL 2 MG/2ML IJ SOLN
INTRAMUSCULAR | Status: AC
  Filled 2014-07-03: qty 2

## 2014-07-03 MED ORDER — DILTIAZEM HCL ER COATED BEADS 120 MG PO CP24
120.0000 mg | ORAL_CAPSULE | Freq: Every day | ORAL | Status: DC
  Administered 2014-07-04 – 2014-07-06 (×3): 120 mg via ORAL
  Filled 2014-07-03 (×6): qty 1

## 2014-07-03 SURGICAL SUPPLY — 59 items
BANDAGE ESMARK 6X9 LF (GAUZE/BANDAGES/DRESSINGS) IMPLANT
BNDG ESMARK 6X9 LF (GAUZE/BANDAGES/DRESSINGS)
CANISTER SUCTION 2500CC (MISCELLANEOUS) ×3 IMPLANT
CANNULA VESSEL 3MM 2 BLNT TIP (CANNULA) ×3 IMPLANT
CATH EMB 3FR 40CM (CATHETERS) ×3 IMPLANT
CATH EMB 3FR 80CM (CATHETERS) IMPLANT
CATH EMB 4FR 80CM (CATHETERS) IMPLANT
CATH EMB 5FR 80CM (CATHETERS) IMPLANT
CLIP TI MEDIUM 24 (CLIP) ×3 IMPLANT
CLIP TI WIDE RED SMALL 24 (CLIP) ×3 IMPLANT
CLOSURE WOUND 1/2 X4 (GAUZE/BANDAGES/DRESSINGS) ×1
COVER PROBE W GEL 5X96 (DRAPES) ×3 IMPLANT
COVER SURGICAL LIGHT HANDLE (MISCELLANEOUS) ×3 IMPLANT
CUFF TOURNIQUET SINGLE 18IN (TOURNIQUET CUFF) IMPLANT
CUFF TOURNIQUET SINGLE 24IN (TOURNIQUET CUFF) IMPLANT
CUFF TOURNIQUET SINGLE 34IN LL (TOURNIQUET CUFF) IMPLANT
CUFF TOURNIQUET SINGLE 44IN (TOURNIQUET CUFF) IMPLANT
DRAIN CHANNEL 15F RND FF W/TCR (WOUND CARE) IMPLANT
DRAPE PROXIMA HALF (DRAPES) ×3 IMPLANT
DRAPE X-RAY CASS 24X20 (DRAPES) IMPLANT
DRSG COVADERM 4X8 (GAUZE/BANDAGES/DRESSINGS) IMPLANT
ELECT REM PT RETURN 9FT ADLT (ELECTROSURGICAL) ×3
ELECTRODE REM PT RTRN 9FT ADLT (ELECTROSURGICAL) ×1 IMPLANT
EVACUATOR SILICONE 100CC (DRAIN) IMPLANT
GLOVE BIO SURGEON STRL SZ7 (GLOVE) ×3 IMPLANT
GLOVE BIOGEL PI IND STRL 6.5 (GLOVE) ×2 IMPLANT
GLOVE BIOGEL PI IND STRL 7.0 (GLOVE) ×1 IMPLANT
GLOVE BIOGEL PI IND STRL 7.5 (GLOVE) ×1 IMPLANT
GLOVE BIOGEL PI INDICATOR 6.5 (GLOVE) ×4
GLOVE BIOGEL PI INDICATOR 7.0 (GLOVE) ×2
GLOVE BIOGEL PI INDICATOR 7.5 (GLOVE) ×2
GOWN STRL REUS W/ TWL LRG LVL3 (GOWN DISPOSABLE) ×3 IMPLANT
GOWN STRL REUS W/TWL LRG LVL3 (GOWN DISPOSABLE) ×9
KIT BASIN OR (CUSTOM PROCEDURE TRAY) ×3 IMPLANT
KIT ROOM TURNOVER OR (KITS) ×3 IMPLANT
NS IRRIG 1000ML POUR BTL (IV SOLUTION) ×6 IMPLANT
PACK PERIPHERAL VASCULAR (CUSTOM PROCEDURE TRAY) ×3 IMPLANT
PAD ARMBOARD 7.5X6 YLW CONV (MISCELLANEOUS) ×6 IMPLANT
SET COLLECT BLD 21X3/4 12 (NEEDLE) IMPLANT
SPONGE GAUZE 4X4 12PLY STER LF (GAUZE/BANDAGES/DRESSINGS) ×3 IMPLANT
SPONGE SURGIFOAM ABS GEL 100 (HEMOSTASIS) IMPLANT
STAPLER VISISTAT 35W (STAPLE) ×3 IMPLANT
STOPCOCK 4 WAY LG BORE MALE ST (IV SETS) IMPLANT
STRIP CLOSURE SKIN 1/2X4 (GAUZE/BANDAGES/DRESSINGS) ×2 IMPLANT
SUT ETHILON 3 0 PS 1 (SUTURE) ×3 IMPLANT
SUT MNCRL AB 4-0 PS2 18 (SUTURE) ×3 IMPLANT
SUT PROLENE 5 0 C 1 24 (SUTURE) ×3 IMPLANT
SUT PROLENE 6 0 BV (SUTURE) ×3 IMPLANT
SUT SILK 2 0 FS (SUTURE) IMPLANT
SUT VIC AB 2-0 CT1 27 (SUTURE) ×2
SUT VIC AB 2-0 CT1 TAPERPNT 27 (SUTURE) ×1 IMPLANT
SUT VIC AB 3-0 SH 27 (SUTURE) ×2
SUT VIC AB 3-0 SH 27X BRD (SUTURE) ×1 IMPLANT
SYR 3ML LL SCALE MARK (SYRINGE) ×3 IMPLANT
TAPE CLOTH SURG 6X10 WHT LF (GAUZE/BANDAGES/DRESSINGS) ×3 IMPLANT
TRAY FOLEY CATH 16FRSI W/METER (SET/KITS/TRAYS/PACK) ×3 IMPLANT
TUBING EXTENTION W/L.L. (IV SETS) IMPLANT
UNDERPAD 30X30 INCONTINENT (UNDERPADS AND DIAPERS) ×3 IMPLANT
WATER STERILE IRR 1000ML POUR (IV SOLUTION) ×3 IMPLANT

## 2014-07-03 NOTE — Progress Notes (Signed)
ANTICOAGULATION CONSULT NOTE - Initial Consult  Pharmacy Consult for Heparin  Indication: atrial fibrillation  Allergies  Allergen Reactions  . Fentanyl     REACTION: unspecified  . Metoprolol Tartrate     REACTION: sob  . Morphine Sulfate Itching    Patient Measurements: Height: 6\' 2"  (188 cm) Weight: 211 lb 6.7 oz (95.9 kg) IBW/kg (Calculated) : 82.2   Vital Signs: Temp: 97.8 F (36.6 C) (01/23 1815) Temp Source: Oral (01/23 1357) BP: 137/58 mmHg (01/23 1900) Pulse Rate: 84 (01/23 1900)  Labs:  Recent Labs  07/03/14 1556 07/03/14 1612  HGB 11.9* 13.6  HCT 36.3* 40.0  PLT 265  --   LABPROT 29.1*  --   INR 2.72*  --   CREATININE 0.84 0.70    Estimated Creatinine Clearance: 104.2 mL/min (by C-G formula based on Cr of 0.7).   Medical History: Past Medical History  Diagnosis Date  . Emphysema   . OSA (obstructive sleep apnea)       Assessment: 68yom with Hx Afib on warfarin pta - admitted for left brachial and ulnar artery thromboembolectomy.  Plan to start heparin drip in am.  INR 2.7 on admit but given FFP in OR.  Will check INR in am, CBC stable.    Goal of Therapy:  INR 2-3 Heparin level 0.3-0.7 units/ml Monitor platelets by anticoagulation protocol: Yes   Plan:  Heparin drip 1000 uts/hr - start at 0600 07/04/14  Bonnita Nasuti Pharm.D. CPP, BCPS Clinical Pharmacist [phone] 07/03/2014 7:21 PM

## 2014-07-03 NOTE — Op Note (Signed)
OPERATIVE NOTE   PROCEDURE: 1. Left brachial and ulnar artery thromboembolectomy  PRE-OPERATIVE DIAGNOSIS: Left ulnar thromboembolism  POST-OPERATIVE DIAGNOSIS: same as above   SURGEON: Adele Barthel, MD  ASSISTANT(S): Leontine Locket, PAC   ANESTHESIA: local and MAC  ESTIMATED BLOOD LOSS: 100 cc  FINDING(S): 1.  Thrombus in brachial artery extending in to ulnar artery 2.  Good collateral flow in ulnar artery evident have extracting thrombus from ulnar artery 3.  Palpable left ulnar pulse  SPECIMEN(S):  Brachial and ulnar embolus  INDICATIONS:   Grant Santos is a 69 y.o. male who presents with left hand ischemia and arterial duplex consistent with ulnar embolus.  [facility] at brachial and ulnar artery thromboembolectomy.  The patient is aware the risks include but are not limited to bleeding, infection, nerve damage, and inability to complete the procedure and need for additional procedures.  The patient is aware of these risks and agrees to proceed.  DESCRIPTION: After obtaining full informed written consent, the patient was brought back to the operating room and placed supine upon the operating table.  The patient received IV antibiotics prior to induction.  After obtaining adequate anesthesia, the patient was prepped and draped in the standard fashion for: left forearm exploration.  Under Sonosite guidance, I identified a segment of brachial artery that I felt was immediately adjacent to the bifurcation.  I injected a total ~25 cc of 1% lidocaine without epinephrine to get a field block.  I made an incision over the brachial artery and dissected it out with blunt dissection and electrocautery.  I dissected off a brachial vein on top the brachial artery.  I could identify the ulnar artery and what I thought was the proximal radial artery.  I placed vessel loops around all these vessels including the brachial artery.  The patient was already  fully anticoagulated and fresh frozen plasma was only just starting to infuse.  I placed ulnar and radial arteries under tension and made a transverse arteriotomy.  Immediately thrombus was evident.  I extracted some thrombus  to help passage of a 3 Fogarty distally into the ulnar artery.  I kept the radial artery under tension during this process, as a segment of the radial artery had previously been harvested for a CABG, so no attempt to thrombectomize the radial artery was necessary.  I extracted thrombus on multiple passes and had rest of limited backbleeding.  I placed this ulnar under tension again and then turned my attention to the brachial artery.  I passed the 3 Fogarty proximally and extracted the embolic plug.  There was subsequently pulsatile bleeding in the brachial artery with no further thrombus evident.  I placed the brachial artery back under tension.  I again passed the 3 Fogarty distally in the ulnar artery and extracted an additional plug of thrombus.  This caused return of good bleeding from the ulnar artery.  No further thrombus refluxed and no further thrombus was retrieved on additional passes.  I clamped then the ulnar and brachial arteries with Seraphim clamps to take off tension on the arteriotomy.  I repaired this arteriotomy with a running 7-0 Prolene.  I backbled the artery prior to completing this repair.  There was no thrombus from either end.  I washed out the surgical wound and packed it with thrombin and gelfoam.  After a few minutes, I removed the gelfoam and there was no active bleeding but there was diffuse bleeding,  likely from being still anticoagulated despite the 2 units of fresh frozen plasma which had finished infusing by this time.    I elected to place a TLS drain through the subcutaneous tissue.  I cut the drain to appropriate size, putting it adjacent to the artery.  I reapproximated the subcutaneous tissue over the drain.  The drain was secured with a 3-0 Nylon  tied to the drain.  The test tube receptacle was docked onto the end of the drain.  The skin was reapproximated with staples.  The drain was placed under suction by loading a test tube onto the receptacle.   At the end of the case, there was a palpable left ulnar pulse   COMPLICATIONS: none  CONDITION: stable   Adele Barthel, MD [facility] [place] Office: [phone] Pager: [phone]  07/03/2014, 6:22 PM

## 2014-07-03 NOTE — Anesthesia Preprocedure Evaluation (Addendum)
Anesthesia Evaluation  Patient identified by MRN, date of birth, ID band Patient awake    Reviewed: Allergy & Precautions, NPO status , Patient's Chart, lab work & pertinent test results  Airway Mallampati: I  TM Distance: >3 FB Neck ROM: Full    Dental  (+) Poor Dentition, Dental Advisory Given   Pulmonary sleep apnea , COPD COPD inhaler, former smoker,  07/02/14 - CT shows ground glass opacities of resolving pneumonia. Same process as November 2015.         Cardiovascular + CAD and + CABG + dysrhythmias Atrial Fibrillation  07/02/2014 Echo: EF 50-55% Mild MR. No AS/AI. Trivial Pericardial effusion   Neuro/Psych    GI/Hepatic GERD-  Medicated and Controlled,  Endo/Other    Renal/GU      Musculoskeletal   Abdominal   Peds  Hematology   Anesthesia Other Findings   Reproductive/Obstetrics                          Anesthesia Physical Anesthesia Plan  ASA: III  Anesthesia Plan: MAC   Post-op Pain Management:    Induction: Intravenous  Airway Management Planned: Natural Airway  Additional Equipment:   Intra-op Plan:   Post-operative Plan: Extubation in OR  Informed Consent: I have reviewed the patients History and Physical, chart, labs and discussed the procedure including the risks, benefits and alternatives for the proposed anesthesia with the patient or authorized representative who has indicated his/her understanding and acceptance.   Dental advisory given  Plan Discussed with: CRNA, Surgeon and Anesthesiologist  Anesthesia Plan Comments:        Anesthesia Quick Evaluation

## 2014-07-03 NOTE — Anesthesia Postprocedure Evaluation (Signed)
Anesthesia Post Note  Patient: Grant Santos  Procedure(s) Performed: Procedure(s) (LRB): EMBOLECTOMY BRACHIAL (Left)  Anesthesia type: general  Patient location: PACU  Post pain: Pain level controlled  Post assessment: Patient's Cardiovascular Status Stable  Last Vitals:  Filed Vitals:   07/03/14 2015  BP: 123/71  Pulse: 121  Temp: 36.6 C  Resp: 28    Post vital signs: Reviewed and stable  Level of consciousness: sedated  Complications: No apparent anesthesia complications

## 2014-07-03 NOTE — Transfer of Care (Signed)
Immediate Anesthesia Transfer of Care Note  Patient: Grant Santos  Procedure(s) Performed: Procedure(s) with comments: EMBOLECTOMY BRACHIAL (Left) - Left brachial and ulnar embolectomy.  Patient Location: PACU  Anesthesia Type:MAC  Level of Consciousness: awake, alert  and oriented  Airway & Oxygen Therapy: Patient Spontanous Breathing and Patient connected to face mask oxygen  Post-op Assessment: Report given to PACU RN and Post -op Vital signs reviewed and stable  Post vital signs: Reviewed and stable  Complications: No apparent anesthesia complications
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
## 2014-07-03 NOTE — H&P (Addendum)
[facility] History and Physical  Grant Santos NOI:[id number] DOB: [birth year] DOA: 07/03/2014  Referring physician:  PCP: Nicholos Johns, MD   Chief Complaint: Left arm pain  HPI: Grant Santos is a 69 y.o. male with a past medical history of chronic obstructive pulmonary disease, history of left lower extremity amputation, presented as a transfer from [facility]. Patient was admitted to [facility] on 07/02/2014 presenting with complaints of increasing shortness of breath, productive cough, found to be in hypoxemic respiratory failure. He was worked up with a CT scan of lungs which did not show evidence of pulmonary embolism however did show evidence of pneumonia with superimposed COPD changes. He was admitted to the step down unit and started on empiric IV antimicrobial therapy with Levaquin and imipenem. He was also treated with IV steroids, bronchodilators, supplemental oxygen. On the morning of 07/03/2014 patient complains of sudden onset left forearm pain. Staff noted extremity to be cool and cyanotic as he had Dopplers performed at that facility. This revealed distal brachial artery occlusion. Patient was transferred to [facility] to be evaluated by vascular surgery. Patient with his history of atrial fibrillation and had been anticoagulated with warfarin. He had INR of 3.6. Case was discussed with Dr. Bridgett Larsson of vascular surgery who requested type and cross with transfusion of 2 units of fresh frozen plasma.                                                                                                                                                                                      Review of Systems:  Constitutional:  No weight loss, night sweats, Fevers,  positive for chills and  fatigue.  HEENT:  No headaches, Difficulty swallowing,Tooth/dental problems,Sore throat,  No sneezing, itching, ear ache, nasal congestion, post nasal drip,  Cardio-vascular:  No  chest pain, Orthopnea, PND, swelling in lower extremities, anasarca, dizziness, palpitations  GI:  No heartburn, indigestion, abdominal pain, nausea, vomiting, diarrhea, change in bowel habits, loss of appetite  Resp:  Positive for shortness of breath with exertion or at rest. No excess mucus, no productive cough, No non-productive cough, No coughing up of blood.No change in color of mucus.No wheezing.No chest wall deformity  Skin:  no rash or lesions.  GU:  no dysuria, change in color of urine, no urgency or frequency. No flank pain.  Musculoskeletal:  No joint pain or swelling. No decreased range of motion. No back pain.  Positive for left arm pain Psych:  No change in mood or affect. No depression or anxiety. No memory loss.   Past Medical History  Diagnosis Date  . Emphysema   . OSA (obstructive sleep  apnea)    No past surgical history on file. Social History:  reports that he quit smoking about 18 years ago. His smoking use included Cigarettes. He has a 35 pack-year smoking history. He does not have any smokeless tobacco history on file. His alcohol and drug histories are not on file.  Allergies  Allergen Reactions  . Fentanyl     REACTION: unspecified  . Metoprolol Tartrate     REACTION: sob  . Morphine     REACTION: itching  . Morphine Sulfate     REACTION: unspecified    No family history on file.   Prior to Admission medications   Medication Sig Start Date End Date Taking? Authorizing Provider  ADVAIR DISKUS 250-50 MCG/DOSE AEPB INHALE 1 PUFFS EVERY 12 HOURS 02/09/14   Kathee Delton, MD  albuterol (PROVENTIL HFA;VENTOLIN HFA) 108 (90 BASE) MCG/ACT inhaler Inhale 2 puffs into the lungs every 6 (six) hours as needed for shortness of breath. 03/21/11   Kathee Delton, MD  Alpha Lipoic Acid 200 MG CAPS Take 3 capsules by mouth daily.    Historical Provider, MD  ALPRAZolam Duanne Moron) 0.25 MG tablet As needed 04/29/14   Historical Provider, MD  aspirin 81 MG tablet Take 81 mg  by mouth daily.     Historical Provider, MD  atorvastatin (LIPITOR) 40 MG tablet Take 40 mg by mouth at bedtime. 04/29/14   Historical Provider, MD  bethanechol (URECHOLINE) 25 MG tablet Take 25 mg by mouth 3 (three) times daily. 05/10/14   Historical Provider, MD  CARTIA XT 180 MG 24 hr capsule Take 1 capsule by mouth daily. 11/18/13   Historical Provider, MD  digoxin (LANOXIN) 0.125 MG tablet Take 125 mcg by mouth daily. 04/29/14   Historical Provider, MD  diltiazem (CARDIZEM CD) 240 MG 24 hr capsule Take 240 mg by mouth daily. 04/29/14   Historical Provider, MD  donepezil (ARICEPT) 10 MG tablet Take 1 tablet by mouth daily. 11/26/13   Historical Provider, MD  esomeprazole (NEXIUM) 20 MG capsule Take 20 mg by mouth daily at 12 noon.    Historical Provider, MD  Fish Oil-Cholecalciferol (FISH OIL + D3) 1000-1000 MG-UNIT CAPS Take 1 capsule by mouth daily.    Historical Provider, MD  furosemide (LASIX) 40 MG tablet Take 40 mg by mouth 2 (two) times daily.     Historical Provider, MD  gabapentin (NEURONTIN) 600 MG tablet Take 600 mg by mouth 2 (two) times daily.     Historical Provider, MD  glucosamine-chondroitin 500-400 MG tablet Take 1 tablet by mouth 3 (three) times daily.    Historical Provider, MD  guaiFENesin (MUCINEX) 600 MG 12 hr tablet Take 1,200 mg by mouth 2 (two) times daily.      Historical Provider, MD  isosorbide mononitrate (IMDUR) 60 MG 24 hr tablet Take 90 mg by mouth daily.     Historical Provider, MD  KLOR-CON M20 20 MEQ tablet Once daily 04/29/14   Historical Provider, MD  levalbuterol Penne Lash) 1.25 MG/3ML nebulizer solution 3 times daily 04/30/14   Historical Provider, MD  levofloxacin (LEVAQUIN) 750 MG tablet Take 750 mg by mouth daily. 05/16/14   Historical Provider, MD  metroNIDAZOLE (FLAGYL) 500 MG tablet Take 500 mg by mouth 3 (three) times daily. 05/16/14   Historical Provider, MD  Multiple Vitamin (MULTIVITAMIN) capsule Take 1 capsule by mouth daily.      Historical  Provider, MD  nitroGLYCERIN (NITROSTAT) 0.4 MG SL tablet Place 0.4 mg under the tongue every 5 (  five) minutes as needed.      Historical Provider, MD  oxycodone (ROXICODONE) 30 MG immediate release tablet 1 tablet every 4 hrs 04/15/14   Historical Provider, MD  Probiotic Product ([employer] PROBIOTIC) CAPS 1 capsule twice daily 05/16/14   Historical Provider, MD  ramipril (ALTACE) 2.5 MG capsule Take 2.5 mg by mouth daily.      Historical Provider, MD  tiZANidine (ZANAFLEX) 2 MG tablet Take 2 mg by mouth 2 (two) times daily.    Historical Provider, MD  TUDORZA PRESSAIR 400 MCG/ACT AEPB INHALE 1 PUFF TWICE DAILY 04/26/14   Kathee Delton, MD   Physical Exam: Filed Vitals:   07/03/14 1357 07/03/14 1401  BP:  113/66  Pulse: 88 77  Temp: 98 F (36.7 C)   TempSrc: Oral   Resp: 16 19  Height: 6\' 2"  (1.88 m)   Weight: 95.9 kg (211 lb 6.7 oz)   SpO2: 95% 94%    Wt Readings from Last 3 Encounters:  07/03/14 95.9 kg (211 lb 6.7 oz)  05/20/14 97.886 kg (215 lb 12.8 oz)  03/22/14 108.138 kg (238 lb 6.4 oz)    General:  Patient appears to be in mild distress, on supplemental oxygen, he comes in short of breath with speaking Eyes: PERRL, normal lids, irises & conjunctiva ENT: grossly normal hearing, lips & tongue Neck: no LAD, masses or thyromegaly Cardiovascular:  Coarse respiratory sounds, diminished breath sounds bilaterally, bilateral expiratory wheezing and rhonchi Telemetry: SR, no arrhythmias  Respiratory: CTA bilaterally, no w/r/r. Normal respiratory effort. Abdomen: soft, ntnd Skin:  There is associated coolness and pallor to his left hand compared to right hand. There was delayed capillary refill involving left hand as well compared to right. Musculoskeletal: grossly normal tone BUE/BLE, s/p left BKA Psychiatric: grossly normal mood and affect, speech fluent and appropriate Neurologic: grossly non-focal.          Labs on Admission:  Basic Metabolic Panel: No results for input(s): NA,  K, CL, CO2, GLUCOSE, BUN, CREATININE, CALCIUM, MG, PHOS in the last 168 hours. Liver Function Tests: No results for input(s): AST, ALT, ALKPHOS, BILITOT, PROT, ALBUMIN in the last 168 hours. No results for input(s): LIPASE, AMYLASE in the last 168 hours. No results for input(s): AMMONIA in the last 168 hours. CBC: No results for input(s): WBC, NEUTROABS, HGB, HCT, MCV, PLT in the last 168 hours. Cardiac Enzymes: No results for input(s): CKTOTAL, CKMB, CKMBINDEX, TROPONINI in the last 168 hours.  BNP (last 3 results) No results for input(s): PROBNP in the last 8760 hours. CBG: No results for input(s): GLUCAP in the last 168 hours.  Radiological Exams on Admission: No results found.  EKG: Independently reviewed.   Assessment/Plan Principal Problem:   Brachial artery occlusion, right Active Problems:   COPD (chronic obstructive pulmonary disease) with emphysema   CAP (community acquired pneumonia)   A-fib   Acute respiratory failure   1. Suspected brachial artery occlusion. Patient initially admitted to [facility] for COPD exacerbation and pneumonia, complained of sudden onset pain involving his left arm this morning. Staff members noticing extremity coolness and pallor compared to right upper extremity. Of note lab work apparently had showed a supratherapeutic INR of 3.4 as he was anticoagulated with warfarin for atrial fibrillation. Ultrasound showing brachial artery occlusion involving distal portion. Case was discussed with Dr. Bridgett Larsson of vascular surgery who recommended typing cross and administering 2 units of fresh frozen plasma. Will check a stat PT/INR. Patient likely to be taken to the  OR for which will make him nothing by mouth.  2. Community acquire pneumonia. Patient was transferred from outside hospital where he was treated for community acquired pneumonia with IV imipenem and Levaquin. Suspect underlying infectious process precipitating COPD exacerbation and acute  hypoxemic respiratory failure. Will treat with IV Rocephin and Azithromycin for community acquired PNA. Follow-up on chest x-ray. 3. Acute hypoxemic respiratory failure. Patient presenting in acute hypoxemic respiratory failure to outside hospital yesterday, presenting with venturi mask. Likely secondary to chronic obstructive pulmonary disease exacerbation along with community acquire pneumonia. Continue empiric IV antimicrobial therapy, IV steroids, scheduled duo nebs, supplemental oxygen. 4. Chronic obstructive pulmonary disease exacerbation. Will continue IV steroids with centimeters 60 mg IV every 6 hours, scheduled duo nebs, supplemental oxygen. 5. Atrial fibrillation. Currently rate controlled having a heart rate of 77. Will continue Digoxin 125 g by mouth daily, Cardizem at 120 mg by mouth daily. Warfarin has been discontinued.  6. Hypertension. Patient on multiple antihypertensive agents, presenting with blood pressure 113/66. Will decrease Cardizem dose 120 mg by mouth daily, stop ACE inhibitor and indoor for now. Monitor patient's blood pressures closely.  7. DVT prophylaxis. SCDs   Code Status: Full code Family Communication: I spoke to family members present at bedside Disposition Plan: Will admit patient to the step down unit, anticipate he will require greater than 2 nights hospitalization  Time spent: 70 min  Kelvin Cellar [facility] Pager [phone]

## 2014-07-03 NOTE — Progress Notes (Signed)
   07/03/14 2015  Oxygen Therapy  SpO2 (!) 86 %  O2 Device Non-rebreather Mask  O2 Flow Rate (L/min) 15 L/min  FiO2 (%) 100 %  Pulse Oximetry Type Continuous  pt 86% on 50 percent venni mask switched to 100% NRB 98 percent will continue to monitor

## 2014-07-04 ENCOUNTER — Inpatient Hospital Stay (HOSPITAL_COMMUNITY): Payer: Medicare Other

## 2014-07-04 ENCOUNTER — Encounter (HOSPITAL_COMMUNITY): Payer: Self-pay

## 2014-07-04 DIAGNOSIS — I482 Chronic atrial fibrillation: Secondary | ICD-10-CM

## 2014-07-04 DIAGNOSIS — I4891 Unspecified atrial fibrillation: Secondary | ICD-10-CM

## 2014-07-04 LAB — PREPARE FRESH FROZEN PLASMA
UNIT DIVISION: 0
Unit division: 0

## 2014-07-04 LAB — CBC
HCT: 31.4 % — ABNORMAL LOW (ref 39.0–52.0)
HEMOGLOBIN: 10.5 g/dL — AB (ref 13.0–17.0)
MCH: 29.1 pg (ref 26.0–34.0)
MCHC: 33.4 g/dL (ref 30.0–36.0)
MCV: 87 fL (ref 78.0–100.0)
Platelets: 131 10*3/uL — ABNORMAL LOW (ref 150–400)
RBC: 3.61 MIL/uL — AB (ref 4.22–5.81)
RDW: 13.9 % (ref 11.5–15.5)
WBC: 17.8 10*3/uL — ABNORMAL HIGH (ref 4.0–10.5)

## 2014-07-04 LAB — BASIC METABOLIC PANEL
ANION GAP: 12 (ref 5–15)
BUN: 20 mg/dL (ref 6–23)
CO2: 27 mmol/L (ref 19–32)
Calcium: 8.9 mg/dL (ref 8.4–10.5)
Chloride: 100 mmol/L (ref 96–112)
Creatinine, Ser: 0.7 mg/dL (ref 0.50–1.35)
GFR calc Af Amer: >90 mL/min (ref >90)
GFR calc non Af Amer: >90 mL/min (ref >90)
Glucose, Bld: 153 mg/dL — ABNORMAL HIGH (ref 70–99)
POTASSIUM: 4 mmol/L (ref 3.5–5.1)
Sodium: 139 mmol/L (ref 135–145)

## 2014-07-04 LAB — PROTIME-INR
INR: 1.5 — ABNORMAL HIGH (ref 0.00–1.49)
Prothrombin Time: 18.3 seconds — ABNORMAL HIGH (ref 11.6–15.2)

## 2014-07-04 LAB — HEPARIN LEVEL (UNFRACTIONATED): Heparin Unfractionated: <0.1 IU/mL — ABNORMAL LOW (ref 0.30–0.70)

## 2014-07-04 MED ORDER — LORAZEPAM 2 MG/ML IJ SOLN
INTRAMUSCULAR | Status: AC
  Administered 2014-07-04: 1 mg via INTRAVENOUS
  Filled 2014-07-04: qty 1

## 2014-07-04 MED ORDER — FUROSEMIDE 10 MG/ML IJ SOLN
20.0000 mg | Freq: Once | INTRAMUSCULAR | Status: AC
  Administered 2014-07-04: 20 mg via INTRAVENOUS

## 2014-07-04 MED ORDER — FUROSEMIDE 40 MG PO TABS: 40.0000 mg | ORAL_TABLET | Freq: Every day | ORAL | Status: DC

## 2014-07-04 MED ORDER — LEVALBUTEROL HCL 1.25 MG/0.5ML IN NEBU
1.2500 mg | INHALATION_SOLUTION | Freq: Four times a day (QID) | RESPIRATORY_TRACT | Status: DC
  Administered 2014-07-04 – 2014-07-06 (×10): 1.25 mg via RESPIRATORY_TRACT
  Filled 2014-07-04 (×13): qty 0.5

## 2014-07-04 MED ORDER — FUROSEMIDE 40 MG PO TABS
40.0000 mg | ORAL_TABLET | Freq: Every day | ORAL | Status: DC
  Administered 2014-07-04 – 2014-07-12 (×9): 40 mg via ORAL
  Filled 2014-07-04 (×10): qty 1

## 2014-07-04 MED ORDER — IPRATROPIUM BROMIDE 0.02 % IN SOLN
0.5000 mg | Freq: Four times a day (QID) | RESPIRATORY_TRACT | Status: DC
  Administered 2014-07-04 – 2014-07-12 (×32): 0.5 mg via RESPIRATORY_TRACT
  Filled 2014-07-04 (×34): qty 2.5

## 2014-07-04 MED ORDER — FUROSEMIDE 10 MG/ML IJ SOLN
INTRAMUSCULAR | Status: AC
  Filled 2014-07-04: qty 4

## 2014-07-04 MED ORDER — IOHEXOL 350 MG/ML SOLN
80.0000 mL | Freq: Once | INTRAVENOUS | Status: AC | PRN
  Administered 2014-07-04: 80 mL via INTRAVENOUS

## 2014-07-04 MED ORDER — OXYCODONE HCL 5 MG PO TABS
30.0000 mg | ORAL_TABLET | ORAL | Status: DC | PRN
  Administered 2014-07-04 – 2014-07-12 (×34): 30 mg via ORAL
  Filled 2014-07-04 (×35): qty 6

## 2014-07-04 MED ORDER — LORAZEPAM 2 MG/ML IJ SOLN
1.0000 mg | INTRAMUSCULAR | Status: DC | PRN
  Administered 2014-07-04 – 2014-07-10 (×4): 1 mg via INTRAVENOUS
  Filled 2014-07-04 (×3): qty 1

## 2014-07-04 NOTE — Progress Notes (Signed)
[facility] PROGRESS NOTE  MARCELL CHAVARIN DZH:[id number] DOB: Jun 17, 1945 DOA: 07/03/2014 PCP: Nicholos Johns, MD  Assessment/Plan: 1. 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 -Patient transferred to [facility] on 07/03/2014 undergoing left brachial and ulnar artery thromboembolectomy, procedure performed by Dr. Bridgett Larsson of vascular surgery. -He had a CT angiogram performed at [facility] which reportedly did not show evidence of pulmonary embolism. -Patient getting a repeat CT scan angiogram here along with transthoracic echocardiogram to assess thromboembolic source. -On my exam today he appears to be perfusing well to affected extremity  2.  Acute respiratory failure -Evidenced by a respiratory rate of 32, requiring non-rebreather -Likely secondary to combination of COPD exacerbation and pneumonia -He had a repeat chest x-ray done on 07/04/2014 which did not show change in aeration compared to previous exam. There is bilateral airspace opacities identified remain stable.  3.  Community-acquired pneumonia -Repeat chest x-ray showing presence of bilateral airspace opacities -Continue empiric IV antimicrobial therapy with azithromycin and ceftriaxone. He was started on antimicrobials on 07/02/2014.  4.  Chronic obstructive palmar disease exacerbation -Continue IV steroids with Solu-Medrol 60 mg every 6 hours, scheduled nebulizer treatments, inhaled steroid, empiric IV antimicrobial therapy  5.  Chronic pain syndrome, narcotic dependent -Patient currently taking 30 mg of oxycodone at home every 4 hours as needed. He was started back up on his home regimen  6.  Leukocytosis. -Lab showing white count of 17,800, likely secondary to combination of IV steroids, pneumonia, arterial occlusion requiring thromboembolectomy  7. DVT prophylaxis. Patient anticoagulated with IV heparin   Code Status: Full code Family Communication:  Disposition Plan: Continue close monitoring in  the step down unit   Consultants:  Vascular surgery  Procedures:  Left brachial and ulnar artery thromboembolectomy, procedure performed by Dr. Bridgett Larsson of vascular surgery on 07/03/2014.  Antibiotics:  Ceftriaxone (started on 07/03/2014)  Azithromycin (started on 07/03/2014)  HPI/Subjective: Patient is a 69 year old with a past medical history of chronic obstructive pulmonary disease, chronic pain syndrome, narcotic dependent, presented as a transfer from [facility] where he initially was admitted on 07/02/2014 with complaints of shortness of breath, productive cough found to be hypoxemic respiratory failure. He was initially worked up with a CT scan of lungs but did not show evidence of pulmonary embolism. Their were findings on CT to suggest pneumonia. This hospitalization was complicated by the development of brachial artery occlusion or which she was transferred to [facility] to be evaluated by vascular surgery. He was taken to the OR on 07/03/2014 undergoing left brachial and ulnar artery thromboembolectomy. Postoperatively he was started on anticoagulation with IV heparin. With regard to his respiratory status, patient likely having COPD exacerbation precipitated by community acquired pneumonia. He was treated with IV steroids, nebulizers, empiric IV antimicrobial therapy with ceftriaxone and azithromycin.   Objective: Filed Vitals:   07/04/14 0759  BP: 131/86  Pulse: 108  Temp: 97.7 F (36.5 C)  Resp: 20    Intake/Output Summary (Last 24 hours) at 07/04/14 1155 Last data filed at 07/04/14 1050  Gross per 24 hour  Intake 1471.83 ml  Output   1857 ml  Net -385.17 ml   Filed Weights   07/03/14 1357  Weight: 95.9 kg (211 lb 6.7 oz)    Exam:   General:  Patient appears to be in mild distress, he is awake, alert, and follows commands, states ongoing shortness of breath.  Cardiovascular: Tachycardic, regular rate rhythm normal S1-S2  Respiratory: Has  bilateral rhonchi, diminished breath sounds,  positive respiratory wheezing  Abdomen: Soft nontender nondistended  Musculoskeletal: Status post left below the knee amputation, left upper extremity. To be perfusing well, pallor and coolness from yesterday's evaluation resolved  Data Reviewed: Basic Metabolic Panel:  Recent Labs Lab 07/03/14 1556 07/03/14 1612 07/04/14 0540  NA 137 135 139  K 4.0 3.9 4.0  CL 97 98 100  CO2 29  --  27  GLUCOSE 138* 137* 153*  BUN 22 24* 20  CREATININE 0.84 0.70 0.70  CALCIUM 9.2  --  8.9   Liver Function Tests:  Recent Labs Lab 07/03/14 1556  AST 45*  ALT 30  ALKPHOS 135*  BILITOT 0.9  PROT 6.3  ALBUMIN 2.7*   No results for input(s): LIPASE, AMYLASE in the last 168 hours. No results for input(s): AMMONIA in the last 168 hours. CBC:  Recent Labs Lab 07/03/14 1556 07/03/14 1612 07/04/14 0540  WBC 18.3*  --  17.8*  NEUTROABS 16.3*  --   --   HGB 11.9* 13.6 10.5*  HCT 36.3* 40.0 31.4*  MCV 85.8  --  87.0  PLT 265  --  131*   Cardiac Enzymes: No results for input(s): CKTOTAL, CKMB, CKMBINDEX, TROPONINI in the last 168 hours. BNP (last 3 results) No results for input(s): PROBNP in the last 8760 hours. CBG: No results for input(s): GLUCAP in the last 168 hours.  Recent Results (from the past 240 hour(s))  MRSA PCR Screening     Status: None   Collection Time: 07/03/14  2:04 PM  Result Value Ref Range Status   MRSA by PCR NEGATIVE NEGATIVE Final    Comment:        The GeneXpert MRSA Assay (FDA approved for NASAL specimens only), is one component of a comprehensive MRSA colonization surveillance program. It is not intended to diagnose MRSA infection nor to guide or monitor treatment for MRSA infections.      Studies: Dg Chest 2 View  07/04/2014   CLINICAL DATA:  Thrombectomy yesterday.  EXAM: CHEST  2 VIEW  COMPARISON:  07/03/2014  FINDINGS: Previous median sternotomy and CABG procedure. Stable heart size. Bilateral  airspace opacities are again identified and appear unchanged from previous exam.  IMPRESSION: No change in aeration the lungs compared with previous exam.   Electronically Signed   By: Kerby Moors M.D.   On: 07/04/2014 09:08    Scheduled Meds: . atorvastatin  40 mg Oral QHS  . azithromycin  500 mg Intravenous Q24H  . bethanechol  25 mg Oral TID  . cefTRIAXone (ROCEPHIN)  IV  1 g Intravenous Q24H  . digoxin  125 mcg Oral Daily  . diltiazem  120 mg Oral Daily  . docusate sodium  100 mg Oral Daily  . furosemide  40 mg Oral Daily  . gabapentin  600 mg Oral BID  . guaiFENesin  1,200 mg Oral BID  . ipratropium  0.5 mg Nebulization Q6H  . levalbuterol  1.25 mg Nebulization Q6H  . methylPREDNISolone (SOLU-MEDROL) injection  60 mg Intravenous Q6H  . mometasone-formoterol  2 puff Inhalation BID  . pantoprazole  40 mg Oral Daily  . potassium chloride SA  20 mEq Oral Daily  . sodium chloride  3 mL Intravenous Q12H  . sodium chloride  3 mL Intravenous Q12H   Continuous Infusions: . heparin 1,000 Units/hr (07/04/14 0900)    Principal Problem:   Brachial artery occlusion, right Active Problems:   COPD (chronic obstructive pulmonary disease) with emphysema   CAP (community acquired  pneumonia)   A-fib   Acute respiratory failure    Time spent: 35 minutes    Kelvin Cellar  [facility] Pager [phone]. If 7PM-7AM, please contact night-coverage at [email or web address], password [facility] 07/04/2014, 11:55 AM  LOS: 1 day

## 2014-07-04 NOTE — Progress Notes (Signed)
  Echocardiogram 2D Echocardiogram has been performed.  Diamond Nickel 07/04/2014, 10:39 AM

## 2014-07-04 NOTE — Progress Notes (Addendum)
  Progress Note    07/04/2014 6:38 AM 1 Day Post-Op  Subjective:  Hand feels better today; states his breathing seems worse  Afebrile HR  70's-100's Afib 572'I-203'T systolic 59% NRB  Filed Vitals:   07/04/14 0330  BP: 122/60  Pulse: 91  Temp: 98.1 F (36.7 C)  Resp: 31    Physical Exam: Cardiac:  irregular Lungs:  Slightly labored  Incisions:  Bandaged; clean Extremities:  Difficulty palpating ulnar pulse; + doppler signal in ulnar and palmer arch    BMET    Component Value Date/Time   NA 139 07/04/2014 0540   K 4.0 07/04/2014 0540   CL 100 07/04/2014 0540   CO2 27 07/04/2014 0540   GLUCOSE 153* 07/04/2014 0540   BUN 20 07/04/2014 0540   CREATININE 0.70 07/04/2014 0540   CALCIUM 8.9 07/04/2014 0540   GFRNONAA >90 07/04/2014 0540   GFRAA >90 07/04/2014 0540    INR    Component Value Date/Time   INR 1.50* 07/04/2014 0540     Intake/Output Summary (Last 24 hours) at 07/04/14 7416 Last data filed at 07/04/14 0500  Gross per 24 hour  Intake   1450 ml  Output   1057 ml  Net    393 ml     Assessment:  69 y.o. male is s/p:  Left brachial and ulnar artery thromboembolectomy  1 Day Post-Op  Plan: -pt doing well from surgical standpoint.  +ulnar doppler signal and palmar arch doppler signal -both containers for the drain were half filled-will d/c after heparin has been going to make sure there is no bleeding -DVT prophylaxis:  Heparin gtt to start this morning-ordered for 0600.  Heparin gtt delayed as pharmacy was awaiting INR to start heparin per RN. -pt INR sub-therapeutic - start heparin (done) -pt in mild respiratory distress-sats dropping with movement on NRB -will give a gentle dose of lasix, ask resp therapist to see pt.  Consider pulmonary consult as he is followed by [facility] (Dr. Gwenette Greet)     Leontine Locket, PA-C [facility] [phone] 07/04/2014 6:38 AM   Addendum  I have independently interviewed and  examined the patient, and I agree with the physician assistant's findings.  Palpable ulnar pulse with intact motor and sensation in L hand.  Inc c/d/i, minimal TLS drain output: remove drain, continue heparin drip for now.  Chest CTA to evaluate embolic source.  Echo for same reason.  Consult cardiology tomorrow to get their opinion on choice of anticoagulant regimen given pt embolized on Coumadin.  Lung findings reflect his underlying pneumonia.  Adele Barthel, MD [facility] [place] Office: [phone] Pager: [phone]  07/04/2014, 10:01 AM

## 2014-07-04 NOTE — Progress Notes (Signed)
ANTICOAGULATION CONSULT NOTE - Follow Up Consult  Pharmacy Consult for Heparin  Indication: atrial fibrillation  Allergies  Allergen Reactions  . Fentanyl     REACTION: unspecified  . Metoprolol Tartrate     REACTION: sob  . Morphine Sulfate Itching    Patient Measurements: Height: 6\' 2"  (188 cm) Weight: 211 lb 6.7 oz (95.9 kg) IBW/kg (Calculated) : 82.2   Vital Signs: Temp: 97.5 F (36.4 C) (01/24 1614) Temp Source: Axillary (01/24 1614) BP: 129/61 mmHg (01/24 1540) Pulse Rate: 80 (01/24 1540)  Labs:  Recent Labs  07/03/14 1556 07/03/14 1612 07/03/14 1948 07/04/14 0540 07/04/14 1421  HGB 11.9* 13.6  --  10.5*  --   HCT 36.3* 40.0  --  31.4*  --   PLT 265  --   --  131*  --   LABPROT 29.1*  --  29.1* 18.3*  --   INR 2.72*  --  2.72* 1.50*  --   HEPARINUNFRC  --   --   --   --  <0.10*  CREATININE 0.84 0.70  --  0.70  --     Estimated Creatinine Clearance: 104.2 mL/min (by C-G formula based on Cr of 0.7).   Medical History: Past Medical History  Diagnosis Date  . Emphysema   . OSA (obstructive sleep apnea)   . Asthma   . Collagen vascular disease   . Hypertension       Assessment: 68yom with Hx Afib on warfarin pta - admitted for left brachial and ulnar artery thromboembolectomy.  Heparin drip drip started this am at low rate given  INR 2.7 on admit but given FFP in OR.  INR this am 1.5, CBC stable.   Heparin drip 1000 uts/hr HL < 0.1 - IV running without problems per RN.    Goal of Therapy:  INR 2-3 Heparin level 0.3-0.7 units/ml Monitor platelets by anticoagulation protocol: Yes   Plan:  Increase Heparin drip 1500 uts/hr  Check HL in 6hr Daily HL, CBC  Bonnita Nasuti Pharm.D. CPP, BCPS Clinical Pharmacist [phone] 07/04/2014 5:53 PM

## 2014-07-04 NOTE — Progress Notes (Signed)
Utilization review completed.  

## 2014-07-05 ENCOUNTER — Encounter (HOSPITAL_COMMUNITY): Payer: Self-pay | Admitting: Vascular Surgery

## 2014-07-05 DIAGNOSIS — I4891 Unspecified atrial fibrillation: Secondary | ICD-10-CM

## 2014-07-05 LAB — PREPARE FRESH FROZEN PLASMA
Unit division: 0
Unit division: 0

## 2014-07-05 LAB — BASIC METABOLIC PANEL
Anion gap: 8 (ref 5–15)
BUN: 20 mg/dL (ref 6–23)
CALCIUM: 8.7 mg/dL (ref 8.4–10.5)
CHLORIDE: 98 mmol/L (ref 96–112)
CO2: 33 mmol/L — ABNORMAL HIGH (ref 19–32)
Creatinine, Ser: 0.68 mg/dL (ref 0.50–1.35)
GFR calc Af Amer: >90 mL/min (ref >90)
GFR calc non Af Amer: >90 mL/min (ref >90)
Glucose, Bld: 168 mg/dL — ABNORMAL HIGH (ref 70–99)
Potassium: 3.6 mmol/L (ref 3.5–5.1)
Sodium: 139 mmol/L (ref 135–145)

## 2014-07-05 LAB — HEPARIN LEVEL (UNFRACTIONATED)
Heparin Unfractionated: 0.46 IU/mL (ref 0.30–0.70)
Heparin Unfractionated: 0.61 IU/mL (ref 0.30–0.70)
Heparin Unfractionated: <0.1 IU/mL — ABNORMAL LOW (ref 0.30–0.70)

## 2014-07-05 LAB — CBC
HEMATOCRIT: 31.8 % — AB (ref 39.0–52.0)
Hemoglobin: 10.4 g/dL — ABNORMAL LOW (ref 13.0–17.0)
MCH: 28 pg (ref 26.0–34.0)
MCHC: 32.7 g/dL (ref 30.0–36.0)
MCV: 85.5 fL (ref 78.0–100.0)
Platelets: 240 10*3/uL (ref 150–400)
RBC: 3.72 MIL/uL — ABNORMAL LOW (ref 4.22–5.81)
RDW: 13.8 % (ref 11.5–15.5)
WBC: 17.3 10*3/uL — ABNORMAL HIGH (ref 4.0–10.5)

## 2014-07-05 LAB — SEDIMENTATION RATE: SED RATE: 22 mm/h — AB (ref 0–16)

## 2014-07-05 LAB — C-REACTIVE PROTEIN: CRP: 2.9 mg/dL — AB (ref <0.60)

## 2014-07-05 MED ORDER — PIPERACILLIN-TAZOBACTAM 3.375 G IVPB
3.3750 g | Freq: Three times a day (TID) | INTRAVENOUS | Status: AC
  Administered 2014-07-05 – 2014-07-08 (×11): 3.375 g via INTRAVENOUS
  Filled 2014-07-05 (×13): qty 50

## 2014-07-05 MED ORDER — VANCOMYCIN HCL IN DEXTROSE 1-5 GM/200ML-% IV SOLN
1000.0000 mg | Freq: Three times a day (TID) | INTRAVENOUS | Status: DC
  Administered 2014-07-05 – 2014-07-07 (×5): 1000 mg via INTRAVENOUS
  Filled 2014-07-05 (×7): qty 200

## 2014-07-05 MED ORDER — BUDESONIDE 0.25 MG/2ML IN SUSP
0.2500 mg | Freq: Two times a day (BID) | RESPIRATORY_TRACT | Status: DC
  Administered 2014-07-05 – 2014-07-12 (×14): 0.25 mg via RESPIRATORY_TRACT
  Filled 2014-07-05 (×17): qty 2

## 2014-07-05 MED ORDER — VANCOMYCIN HCL 10 G IV SOLR
2000.0000 mg | Freq: Once | INTRAVENOUS | Status: AC
  Administered 2014-07-05: 2000 mg via INTRAVENOUS
  Filled 2014-07-05: qty 2000

## 2014-07-05 MED FILL — Thrombin For Soln 20000 Unit: CUTANEOUS | Qty: 1 | Status: AC

## 2014-07-05 NOTE — Evaluation (Signed)
Occupational Therapy Evaluation Patient Details Name: Grant Santos MRN: [id number] DOB: February 18, 1946 Today's Date: 07/05/2014    History of Present Illness 69 y.o. male with a past medical history of chronic obstructive pulmonary disease, history of left lower extremity amputation, presented as a transfer from [facility] [place]. Patient was admitted to [facility] on 07/02/2014 presenting with complaints of increasing shortness of breath, productive cough, found to be in hypoxemic respiratory failure. He was worked up with a CT scan of lungs which did not show evidence of pulmonary embolism however did show evidence of pneumonia with superimposed COPD changes. Pt s/p left brachial and ulnar artery thromboembolectomy   Clinical Impression   Pt admitted with above. Pt independent with ADLs, PTA (reports difficult but able to manage). Feel pt will benefit from acute OT to increase independence with BADLs and increase activity tolerance prior to d/c. Pt with increased HR and decrease in O2 sats in session (see vital section below).     Follow Up Recommendations  Home health OT;Supervision/Assistance - 24 hour    Equipment Recommendations  None recommended by OT    Recommendations for Other Services   PT consult     Precautions / Restrictions Precautions Precautions: Fall Precaution Comments: watch O2 sats and HR Restrictions Weight Bearing Restrictions: No      Mobility Bed Mobility Overal bed mobility: Needs Assistance Bed Mobility: Sit to Supine       Sit to supine: Supervision   General bed mobility comments: supervision for lines; pt sitting up in bed when OT arrived and able to get to edge of bed with no difficulty.  Transfers Overall transfer level: Needs assistance   Transfers: Sit to/from Stand Sit to Stand: Min guard                   ADL Overall ADL's : Needs assistance/impaired     Grooming: Wash/dry face;Applying deodorant;Set  up;Supervision/safety;Sitting   Upper Body Bathing: Set up;Supervision/ safety;Sitting   Lower Body Bathing: Min guard;Sit to/from stand   Upper Body Dressing : Set up;Sitting;Supervision/safety   Lower Body Dressing: Min guard;Sit to/from stand   Toilet Transfer: Min guard;Ambulation (cane)   Toileting- Clothing Manipulation and Hygiene: Min guard;Sit to/from stand       Functional mobility during ADLs: Min guard;Cane General ADL Comments: Educated on energy conservation techniques and deep breathing technique. Educated on benefit of therapy/getting up and doing activities. Educated on safety such as rugs/items on floor and recommended pt have someone with him for tub transfer. Discussed alternative techniques for LB ADLs. Discussed having rollator in front of him when standing for LB dressing and proper hand placement-pt did not use rollator today but has one at home. Encouraged pt to be moving Lt UE-he was moving it in session and did not seem to bother him. Pt ambulated to sink but did not feel he could stand there and perform ADLs-so returned to bed and performed ADLs sitting EOB. Pt able to donn/doff prosthesis in session-cues given for pt to take break and breathe. Suggested using long sponge for LB bathing-pt states he has one.     Vision  Pt wears glasses                   Perception     Praxis      Pertinent Vitals/Pain Pain Assessment: No/denies pain (reported chest pain in session); Pt HR tachy in session with highest seen in 170's-notified nurse. Pt on venti mask at beginning of  session at 15L/50% and dropping in 80's-OT bumped pt up to 25L. Nurse came in and switched pt to non re-beather mask and O2 seemed to do better. Educated on energy conservation techniques and breathing technique. Pt took breaks and performed breathing technique and O2 would trend up.     Hand Dominance     Extremity/Trunk Assessment Upper Extremity Assessment Upper Extremity Assessment:  Overall WFL for tasks assessed   Lower Extremity Assessment Lower Extremity Assessment: Defer to PT evaluation       Communication Communication Communication: No difficulties   Cognition Arousal/Alertness: Awake/alert Behavior During Therapy: WFL for tasks assessed/performed Overall Cognitive Status: Within Functional Limits for tasks assessed                     General Comments       Exercises       Shoulder Instructions      Home Living Family/patient expects to be discharged to:: Private residence Living Arrangements: Spouse/significant other Available Help at Discharge: Family;Available 24 hours/day Type of Home: House Home Access: Stairs to enter;Ramped entrance           Bathroom Shower/Tub: Walk-in shower;Other (comment) (walk in tub)   Bathroom Toilet: Handicapped height     Home Equipment: Grab bars - toilet;Shower seat;Shower seat - built in;Walker - 4 wheels;Cane - single point;Adaptive equipment Adaptive Equipment: Long-handled sponge        Prior Functioning/Environment Level of Independence: Independent with assistive device(s)        Comments: used cane and walker    OT Diagnosis: Other (comment) (cardiopulmonary status limiting activity; decreased activity tolerance)   OT Problem List: Decreased activity tolerance;Decreased knowledge of use of DME or AE;Decreased knowledge of precautions;Pain;Cardiopulmonary status limiting activity   OT Treatment/Interventions: Self-care/ADL training;Therapeutic exercise;Energy conservation;DME and/or AE instruction;Therapeutic activities;Patient/family education;Balance training    OT Goals(Current goals can be found in the care plan section) Acute Rehab OT Goals Patient Stated Goal: not stated OT Goal Formulation: With patient Time For Goal Achievement: 07/12/14 Potential to Achieve Goals: Good ADL Goals Pt Will Perform Grooming: with modified independence;standing Pt Will Perform Upper  Body Bathing: with modified independence;standing;sitting Pt Will Perform Lower Body Bathing: with modified independence;sit to/from stand Pt Will Perform Lower Body Dressing: with modified independence;sit to/from stand Pt Will Transfer to Toilet: with modified independence;ambulating Pt Will Perform Toileting - Clothing Manipulation and hygiene: with modified independence;sit to/from stand Additional ADL Goal #1: Pt will independently verbalize and demonstrate 3/3 energy conservation techniques.  OT Frequency: Min 2X/week   Barriers to D/C:            Co-evaluation              End of Session Equipment Utilized During Treatment: Gait belt;Other (comment);Oxygen Nurse Communication: Other (comment) (HR; assisted some in session-may benefit from PT)  Activity Tolerance: Patient limited by fatigue Patient left: in bed;with call bell/phone within reach;Other (comment) (with respiratory)   Time: 8184-0375 OT Time Calculation (min): 38 min Charges:  OT General Charges $OT Visit: 1 Procedure OT Evaluation $Initial OT Evaluation Tier I: 1 Procedure OT Treatments $Self Care/Home Management : 8-22 mins G-CodesBenito Mccreedy OTR/L [phone] 07/05/2014, 3:11 PM

## 2014-07-05 NOTE — Progress Notes (Signed)
   Cardiology consult called  Laurence Slate [facility] PA-C

## 2014-07-05 NOTE — Progress Notes (Addendum)
INITIAL NUTRITION ASSESSMENT  DOCUMENTATION CODES Per approved criteria  -Severe malnutrition in the context of chronic illness   INTERVENTION:  Ensure Complete PO BID, each supplement provides 350 kcal and 13 grams of protein  NUTRITION DIAGNOSIS: Malnutrition related to inadequate oral intake as evidenced by intake </= 75% of estimated energy requirement for >/= 1 month and 9% weight loss within 3 months.   Goal: Intake to meet >90% of estimated nutrition needs.  Monitor:  PO intake, labs, weight trend.  Reason for Assessment: Malnutrition Screening Tool  69 y.o. male  Admitting Dx: Brachial artery occlusion, right  ASSESSMENT: Patient was transferred from [facility] to [facility] on 1/23 with brachial artery occlusion. S/P left brachial and ulnar thromboembolectomy on 1/23.  Patient reports that he has had "no appetite" since mid November when he developed PNA. He weighed 232 lbs in November, now down to 211 lbs. Patient now with a good appetite, consumed 100% of clear liquid breakfast, then 100% of heart healthy breakfast.  Patient has a left BKA.  Nutrition Focused Physical Exam:  Subcutaneous Fat:  Orbital Region: WNL Upper Arm Region: WNL Thoracic and Lumbar Region: mild depletion  Muscle:  Temple Region: WNL Clavicle Bone Region: mild depletion Clavicle and Acromion Bone Region: mild depletion Scapular Bone Region: mild depletion Dorsal Hand: WNL Patellar Region: WNL Anterior Thigh Region: WNL Posterior Calf Region: WNL  Edema: none   Height: Ht Readings from Last 1 Encounters:  07/03/14 6\' 2"  (1.88 m)    Weight: Wt Readings from Last 1 Encounters:  07/03/14 211 lb 6.7 oz (95.9 kg)    Ideal Body Weight: 82.2 kg  % Ideal Body Weight: 117%  Wt Readings from Last 10 Encounters:  07/03/14 211 lb 6.7 oz (95.9 kg)  05/20/14 215 lb 12.8 oz (97.886 kg)  03/22/14 238 lb 6.4 oz (108.138 kg)  11/27/13 238 lb 9.6 oz (108.228 kg)  03/20/13 247 lb 6.4  oz (112.22 kg)  03/20/12 237 lb 12.8 oz (107.865 kg)  03/21/11 247 lb 12.8 oz (112.401 kg)  03/23/10 233 lb 4 oz (105.802 kg)  11/16/08 235 lb 8 oz (106.822 kg)  11/18/07 253 lb 4 oz (114.873 kg)    Usual Body Weight: 232 lbs (2 months ago)  % Usual Body Weight: 91%  BMI:  28.5 (using equation for amputations)  Estimated Nutritional Needs: Kcal: 2150-2350 Protein: 115-130 gm Fluid: 2.2-2.4 L  Skin: WDL  Diet Order: Diet Heart  EDUCATION NEEDS: -Education needs addressed   Intake/Output Summary (Last 24 hours) at 07/05/14 1406 Last data filed at 07/05/14 0800  Gross per 24 hour  Intake 1798.17 ml  Output   1452 ml  Net 346.17 ml    Last BM: PTA   Labs:   Recent Labs Lab 07/03/14 1556 07/03/14 1612 07/04/14 0540 07/04/14 2355  NA 137 135 139 139  K 4.0 3.9 4.0 3.6  CL 97 98 100 98  CO2 29  --  27 33*  BUN 22 24* 20 20  CREATININE 0.84 0.70 0.70 0.68  CALCIUM 9.2  --  8.9 8.7  GLUCOSE 138* 137* 153* 168*    CBG (last 3)  No results for input(s): GLUCAP in the last 72 hours.  Scheduled Meds: . atorvastatin  40 mg Oral QHS  . bethanechol  25 mg Oral TID  . budesonide  0.25 mg Nebulization BID  . digoxin  125 mcg Oral Daily  . diltiazem  120 mg Oral Daily  . docusate sodium  100  mg Oral Daily  . furosemide  40 mg Oral Daily  . gabapentin  600 mg Oral BID  . guaiFENesin  1,200 mg Oral BID  . ipratropium  0.5 mg Nebulization Q6H  . levalbuterol  1.25 mg Nebulization Q6H  . methylPREDNISolone (SOLU-MEDROL) injection  60 mg Intravenous Q6H  . pantoprazole  40 mg Oral Daily  . piperacillin-tazobactam (ZOSYN)  IV  3.375 g Intravenous 3 times per day  . potassium chloride SA  20 mEq Oral Daily  . sodium chloride  3 mL Intravenous Q12H  . sodium chloride  3 mL Intravenous Q12H  . vancomycin  2,000 mg Intravenous Once  . vancomycin  1,000 mg Intravenous Q8H    Continuous Infusions: . heparin 1,800 Units/hr (07/05/14 0800)    Past Medical History   Diagnosis Date  . Emphysema   . OSA (obstructive sleep apnea)   . Asthma   . Collagen vascular disease   . Hypertension     Past Surgical History  Procedure Laterality Date  . Below knee leg amputation    . Thoracic aortic aneurysm repair  2002  . Coronary artery bypass graft  2002  . Spinal fusion      Molli Barrows, RD, LDN, [place] Pager [phone] After Hours Pager [phone]

## 2014-07-05 NOTE — Progress Notes (Signed)
Pt refuses to wear venti mask while eating and drinking. Education on importance of oxygen therapy done and patient agreed to wear 6l Nasal canula while eating. Oxygen saturation with nasal canula remain in the low to mid 80's and pt also become tachycardic when saturations drop.  Will continue to monitor.

## 2014-07-05 NOTE — Progress Notes (Signed)
[facility] PROGRESS NOTE  Grant Santos UYQ:[id number] DOB: [birth year] DOA: 07/03/2014 PCP: Nicholos Johns, MD  Assessment/Plan: 1. 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 -Patient transferred to [facility] on 07/03/2014 undergoing left brachial and ulnar artery thromboembolectomy, procedure performed by Dr. Bridgett Larsson of vascular surgery. -He had a CT angiogram performed at [facility] which reportedly did not show evidence of pulmonary embolism. -He continues to be perfusing well on left upper extremity -Repeat CT scan of lungs with IV contrast performed 07/04/2014 showing stable severe bilateral pulmonary disease more extensive throughout right lung. Severe fibrotic lung disease noted. No evidence of pulmonary embolism. Specifically there is no thrombus identified in the thoracic aorta or visualized in the great vessels. -Vascular surgery recommending cardiology consultation for choice of anticoagulant  2.  Acute respiratory failure -Evidenced by a respiratory rate of 32, requiring non-rebreather -Likely secondary to combination of COPD exacerbation and pneumonia -He had a repeat chest x-ray done on 07/04/2014 which did not show change in aeration compared to previous exam. There is bilateral airspace opacities identified remain stable. -As mentioned above he had a repeat CT scan of lungs with IV contrast performed on 07/04/2014 that showed severe bilateral pulmonary disease more extensive throughout right with severe fibrotic lung changes. Given lack of significant improvement despite treatment with IV steroids and IV antimicrobial therapy will consult pulmonary medicine.  3.  Community-acquired pneumonia -Repeat chest x-ray showing presence of bilateral airspace opacities -Continue empiric IV antimicrobial therapy with azithromycin and ceftriaxone. He was started on antimicrobials on 07/02/2014.  4.  Chronic obstructive palmar disease exacerbation -Continue IV steroids  with Solu-Medrol 60 mg every 6 hours, scheduled nebulizer treatments, inhaled steroid, empiric IV antimicrobial therapy -Consulting pulmonary medicine  5.  Chronic pain syndrome, narcotic dependent -Patient currently taking 30 mg of oxycodone at home every 4 hours as needed. He was started back up on his home regimen  6.  Leukocytosis. -Lab showing white count of 17,800, likely secondary to combination of IV steroids, pneumonia, arterial occlusion requiring thromboembolectomy  7. DVT prophylaxis. Patient anticoagulated with IV heparin   Code Status: Full code Family Communication:  Disposition Plan: Continue close monitoring in the step down unit   Consultants:  Vascular surgery  Procedures:  Left brachial and ulnar artery thromboembolectomy, procedure performed by Dr. Bridgett Larsson of vascular surgery on 07/03/2014.  Antibiotics:  Ceftriaxone (started on 07/03/2014)  Azithromycin (started on 07/03/2014)  HPI/Subjective: Patient is a 69 year old with a past medical history of chronic obstructive pulmonary disease, chronic pain syndrome, narcotic dependent, presented as a transfer from [facility] where he initially was admitted on 07/02/2014 with complaints of shortness of breath, productive cough found to be hypoxemic respiratory failure. He was initially worked up with a CT scan of lungs but did not show evidence of pulmonary embolism. Their were findings on CT to suggest pneumonia. This hospitalization was complicated by the development of brachial artery occlusion or which she was transferred to [facility] to be evaluated by vascular surgery. He was taken to the OR on 07/03/2014 undergoing left brachial and ulnar artery thromboembolectomy. Postoperatively he was started on anticoagulation with IV heparin. With regard to his respiratory status, patient likely having COPD exacerbation precipitated by community acquired pneumonia. He was treated with IV steroids, nebulizers,  empiric IV antimicrobial therapy with ceftriaxone and azithromycin.   Objective: Filed Vitals:   07/05/14 0755  BP: 139/63  Pulse: 83  Temp: 97.7 F (36.5 C)  Resp: 16    Intake/Output Summary (  Last 24 hours) at 07/05/14 0912 Last data filed at 07/05/14 0800  Gross per 24 hour  Intake 1798.17 ml  Output   2052 ml  Net -253.83 ml   Filed Weights   07/03/14 1357  Weight: 95.9 kg (211 lb 6.7 oz)    Exam:   General:  Patient appears to be in mild distress, he is awake, alert, and follows commands, states ongoing shortness of breath.  Cardiovascular: Tachycardic, regular rate rhythm normal S1-S2  Respiratory: Has bilateral rhonchi, diminished breath sounds, positive respiratory wheezing  Abdomen: Soft nontender nondistended  Musculoskeletal: Status post left below the knee amputation, left upper extremity. To be perfusing well, pallor and coolness from yesterday's evaluation resolved  Data Reviewed: Basic Metabolic Panel:  Recent Labs Lab 07/03/14 1556 07/03/14 1612 07/04/14 0540 07/04/14 2355  NA 137 135 139 139  K 4.0 3.9 4.0 3.6  CL 97 98 100 98  CO2 29  --  27 33*  GLUCOSE 138* 137* 153* 168*  BUN 22 24* 20 20  CREATININE 0.84 0.70 0.70 0.68  CALCIUM 9.2  --  8.9 8.7   Liver Function Tests:  Recent Labs Lab 07/03/14 1556  AST 45*  ALT 30  ALKPHOS 135*  BILITOT 0.9  PROT 6.3  ALBUMIN 2.7*   No results for input(s): LIPASE, AMYLASE in the last 168 hours. No results for input(s): AMMONIA in the last 168 hours. CBC:  Recent Labs Lab 07/03/14 1556 07/03/14 1612 07/04/14 0540 07/04/14 2355  WBC 18.3*  --  17.8* 17.3*  NEUTROABS 16.3*  --   --   --   HGB 11.9* 13.6 10.5* 10.4*  HCT 36.3* 40.0 31.4* 31.8*  MCV 85.8  --  87.0 85.5  PLT 265  --  131* 240   Cardiac Enzymes: No results for input(s): CKTOTAL, CKMB, CKMBINDEX, TROPONINI in the last 168 hours. BNP (last 3 results) No results for input(s): PROBNP in the last 8760 hours. CBG: No  results for input(s): GLUCAP in the last 168 hours.  Recent Results (from the past 240 hour(s))  MRSA PCR Screening     Status: None   Collection Time: 07/03/14  2:04 PM  Result Value Ref Range Status   MRSA by PCR NEGATIVE NEGATIVE Final    Comment:        The GeneXpert MRSA Assay (FDA approved for NASAL specimens only), is one component of a comprehensive MRSA colonization surveillance program. It is not intended to diagnose MRSA infection nor to guide or monitor treatment for MRSA infections.      Studies: Dg Chest 2 View  07/04/2014   CLINICAL DATA:  Thrombectomy yesterday.  EXAM: CHEST  2 VIEW  COMPARISON:  07/03/2014  FINDINGS: Previous median sternotomy and CABG procedure. Stable heart size. Bilateral airspace opacities are again identified and appear unchanged from previous exam.  IMPRESSION: No change in aeration the lungs compared with previous exam.   Electronically Signed   By: Kerby Moors M.D.   On: 07/04/2014 09:08   Ct Angio Chest Aorta W/cm &/or Wo/cm  07/04/2014   CLINICAL DATA:  Hypoxia shortness of breath, COPD and pneumonia. Left brachial and ulnar arm thrombectomy eat yesterday for embolic occlusion.  EXAM: CT ANGIOGRAPHY CHEST WITH CONTRAST  TECHNIQUE: Multidetector CT imaging of the chest was performed using the standard protocol during bolus administration of intravenous contrast. Multiplanar CT image reconstructions and MIPs were obtained to evaluate the vascular anatomy.  CONTRAST:  51mL OMNIPAQUE IOHEXOL 350 MG/ML SOLN  COMPARISON:  07/02/2014 CT[facility] at [facility] and prior unenhanced CT of the chest on 03/30/2011  FINDINGS: Compared to the most recent CTA 2 days ago, there is stable diffuse ground-glass airspace disease and fibrotic changes throughout the right lung and also throughout the lower lingula and left lower lobe. Much of this may be on the basis of chronic fibrotic and interstitial lung disease. However, changes are dramatically worse compared to  a CT dated 03/30/2011. Component of active pneumonia cannot be excluded in either lung. No airway obstruction or significant bronchiectasis. There is decrease in pleural fluid since the prior study with trace left pleural fluid remaining.  A number of small mediastinal lymph nodes are again seen. The largest in the prevascular region shows decrease in size in the last 2 days with short axis measurement of 13 mm. There is no evidence of pulmonary embolism. The thoracic aorta shows no evidence of thrombus or dissection. Visualized proximal great vessels are normally patent. 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  Stable mild cardiac enlargement. No pericardial fluid. The visualized upper abdomen is unremarkable. No bony abnormalities.  Review of the MIP images confirms the above findings.  IMPRESSION: 1. Stable severe bilateral pulmonary disease, more extensive throughout the right lung. This again is suggestive of severe fibrotic lung disease but there may be superimposed pneumonia based on significant change in appearance by CT since 2012. 2. No evidence of pulmonary embolism. 3. No thrombus identified in the thoracic aorta or visualized proximal great vessels. 4. 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   Electronically Signed   By: Aletta Edouard M.D.   On: 07/04/2014 13:49    Scheduled Meds: . atorvastatin  40 mg Oral QHS  . azithromycin  500 mg Intravenous Q24H  . bethanechol  25 mg Oral TID  . cefTRIAXone (ROCEPHIN)  IV  1 g Intravenous Q24H  . digoxin  125 mcg Oral Daily  . diltiazem  120 mg Oral Daily  . docusate sodium  100 mg Oral Daily  . furosemide  40 mg Oral Daily  . gabapentin  600 mg Oral BID  . guaiFENesin  1,200 mg Oral BID  . ipratropium  0.5 mg Nebulization Q6H  . levalbuterol  1.25 mg Nebulization Q6H  . methylPREDNISolone (SOLU-MEDROL) injection  60 mg Intravenous Q6H  .  mometasone-formoterol  2 puff Inhalation BID  . pantoprazole  40 mg Oral Daily  . potassium chloride SA  20 mEq Oral Daily  . sodium chloride  3 mL Intravenous Q12H  . sodium chloride  3 mL Intravenous Q12H   Continuous Infusions: . heparin 1,800 Units/hr (07/05/14 0800)    Principal Problem:   Brachial artery occlusion, right Active Problems:   COPD (chronic obstructive pulmonary disease) with emphysema   CAP (community acquired pneumonia)   A-fib   Acute respiratory failure    Time spent: 35 minutes    Kelvin Cellar  [facility] Pager [phone]. If 7PM-7AM, please contact night-coverage at [email or web address], password [facility] 07/05/2014, 9:12 AM  LOS: 2 days

## 2014-07-05 NOTE — Consult Note (Signed)
PULMONARY / CRITICAL CARE MEDICINE   Name: Grant Santos MRN: [id number] DOB: [birth year]    ADMISSION DATE:  07/03/2014 CONSULTATION DATE:  1/25  REFERRING MD :  zamora ([facility])   CHIEF COMPLAINT:  Respiratory failure   INITIAL PRESENTATION: 69yo male with hx COPD (followed by [facility]), Afib on coumadin, OSA, HTN and  initially admitted to [facility] 1/22 with PNA.  On 1/23 he c/o sudden onset L forearm pain and dopplers revealed brachial artery occlusion.  Pt was tx to [facility] for vascular surgery eval and after INR reversal was taken urgently to OR.  He continued to have worsening dyspnea, hypoxia requiring NRB 1/25 and PCCM consulted for acute respiratory failure.   STUDIES:  CTA chest 1/24>>> NEG PE, severe bilat asd R>L  SIGNIFICANT EVENTS: 1/23 OR>> L brachial and ulnar thromboembolectomy    HISTORY OF PRESENT ILLNESS:  69yo male with hx COPD (followed by [facility]), Afib on coumadin, OSA, HTN and  initially admitted to [facility] 1/22 with PNA.  On 1/23 he c/o sudden onset L forearm pain and dopplers revealed brachial artery occlusion.  Pt was tx to [facility] for vascular surgery eval and after INR reversal was taken urgently to OR.  He continued to have worsening dyspnea, hypoxia requiring NRB 1/25 and PCCM consulted for acute respiratory failure.    PAST MEDICAL HISTORY :   has a past medical history of Emphysema; OSA (obstructive sleep apnea); Asthma; Collagen vascular disease; and Hypertension.  has past surgical history that includes Below knee leg amputation; Thoracic aortic aneurysm repair (2002); Coronary artery bypass graft (2002); and Spinal fusion. Prior to Admission medications   Medication Sig Start Date End Date Taking? Authorizing Provider  oxycodone (ROXICODONE) 30 MG immediate release tablet 1 tablet every 4 hrs 04/15/14  Yes Historical Provider, MD  ADVAIR DISKUS 250-50 MCG/DOSE AEPB INHALE 1 PUFFS EVERY 12 HOURS 02/09/14   Kathee Delton, MD  albuterol (PROVENTIL  HFA;VENTOLIN HFA) 108 (90 BASE) MCG/ACT inhaler Inhale 2 puffs into the lungs every 6 (six) hours as needed for shortness of breath. 03/21/11   Kathee Delton, MD  Alpha Lipoic Acid 200 MG CAPS Take 3 capsules by mouth daily.    Historical Provider, MD  ALPRAZolam Duanne Moron) 0.25 MG tablet As needed 04/29/14   Historical Provider, MD  aspirin 81 MG tablet Take 81 mg by mouth daily.     Historical Provider, MD  atorvastatin (LIPITOR) 40 MG tablet Take 40 mg by mouth at bedtime. 04/29/14   Historical Provider, MD  bethanechol (URECHOLINE) 25 MG tablet Take 25 mg by mouth 3 (three) times daily. 05/10/14   Historical Provider, MD  CARTIA XT 180 MG 24 hr capsule Take 1 capsule by mouth daily. 11/18/13   Historical Provider, MD  digoxin (LANOXIN) 0.125 MG tablet Take 125 mcg by mouth daily. 04/29/14   Historical Provider, MD  diltiazem (CARDIZEM CD) 240 MG 24 hr capsule Take 240 mg by mouth daily. 04/29/14   Historical Provider, MD  donepezil (ARICEPT) 10 MG tablet Take 1 tablet by mouth daily. 11/26/13   Historical Provider, MD  esomeprazole (NEXIUM) 20 MG capsule Take 20 mg by mouth daily at 12 noon.    Historical Provider, MD  Fish Oil-Cholecalciferol (FISH OIL + D3) 1000-1000 MG-UNIT CAPS Take 1 capsule by mouth daily.    Historical Provider, MD  furosemide (LASIX) 40 MG tablet Take 40 mg by mouth 2 (two) times daily.     Historical Provider, MD  gabapentin (NEURONTIN)  600 MG tablet Take 600 mg by mouth 2 (two) times daily.     Historical Provider, MD  glucosamine-chondroitin 500-400 MG tablet Take 1 tablet by mouth 3 (three) times daily.    Historical Provider, MD  guaiFENesin (MUCINEX) 600 MG 12 hr tablet Take 1,200 mg by mouth 2 (two) times daily.      Historical Provider, MD  isosorbide mononitrate (IMDUR) 60 MG 24 hr tablet Take 90 mg by mouth daily.     Historical Provider, MD  KLOR-CON M20 20 MEQ tablet Once daily 04/29/14   Historical Provider, MD  levalbuterol Penne Lash) 1.25 MG/3ML nebulizer  solution 3 times daily 04/30/14   Historical Provider, MD  levofloxacin (LEVAQUIN) 750 MG tablet Take 750 mg by mouth daily. 05/16/14   Historical Provider, MD  metroNIDAZOLE (FLAGYL) 500 MG tablet Take 500 mg by mouth 3 (three) times daily. 05/16/14   Historical Provider, MD  Multiple Vitamin (MULTIVITAMIN) capsule Take 1 capsule by mouth daily.      Historical Provider, MD  nitroGLYCERIN (NITROSTAT) 0.4 MG SL tablet Place 0.4 mg under the tongue every 5 (five) minutes as needed.      Historical Provider, MD  Probiotic Product ([employer] PROBIOTIC) CAPS 1 capsule twice daily 05/16/14   Historical Provider, MD  ramipril (ALTACE) 2.5 MG capsule Take 2.5 mg by mouth daily.      Historical Provider, MD  tiZANidine (ZANAFLEX) 2 MG tablet Take 2 mg by mouth 2 (two) times daily.    Historical Provider, MD  TUDORZA PRESSAIR 400 MCG/ACT AEPB INHALE 1 PUFF TWICE DAILY 04/26/14   Kathee Delton, MD   Allergies  Allergen Reactions  . Fentanyl     REACTION: unspecified  . Metoprolol Tartrate     REACTION: sob  . Morphine Sulfate Itching    FAMILY HISTORY:  has no family status information on file.  SOCIAL HISTORY:  reports that he quit smoking about 18 years ago. His smoking use included Cigarettes. He has a 35 pack-year smoking history. He does not have any smokeless tobacco history on file.  REVIEW OF SYSTEMS:  As per HPI - All other systems reviewed and were neg.    SUBJECTIVE:   VITAL SIGNS: Temp:  [97.3 F (36.3 C)-98 F (36.7 C)] 97.7 F (36.5 C) (01/25 0755) Pulse Rate:  [66-83] 83 (01/25 0755) Resp:  [13-25] 16 (01/25 0755) BP: (111-139)/(60-73) 139/63 mmHg (01/25 0755) SpO2:  [88 %-95 %] 91 % (01/25 0842) FiO2 (%):  [55 %] 55 % (01/25 0842) HEMODYNAMICS:   VENTILATOR SETTINGS: Vent Mode:  [-]  FiO2 (%):  [55 %] 55 % INTAKE / OUTPUT:  Intake/Output Summary (Last 24 hours) at 07/05/14 0934 Last data filed at 07/05/14 0800  Gross per 24 hour  Intake 1798.17 ml  Output   2052 ml   Net -253.83 ml    PHYSICAL EXAMINATION: General:  Chronically ill appearing male, mild respiratory distress when off O2. Neuro:  Alert and interactive, follows all commands. HEENT:  [place]/AT, PERRL, EOM-I, MMM. Cardiovascular:  RRR, Nl S1/S2, -M/R/G. Lungs:  Diffuse crackles. Abdomen:  Soft, NT, ND and +BS. Musculoskeletal:  Intact. Skin:  Intact.  LABS:  CBC  Recent Labs Lab 07/03/14 1556 07/03/14 1612 07/04/14 0540 07/04/14 2355  WBC 18.3*  --  17.8* 17.3*  HGB 11.9* 13.6 10.5* 10.4*  HCT 36.3* 40.0 31.4* 31.8*  PLT 265  --  131* 240   Coag's  Recent Labs Lab 07/03/14 1556 07/03/14 1948 07/04/14 0540  INR 2.72*  2.72* 1.50*   BMET  Recent Labs Lab 07/03/14 1556 07/03/14 1612 07/04/14 0540 07/04/14 2355  NA 137 135 139 139  K 4.0 3.9 4.0 3.6  CL 97 98 100 98  CO2 29  --  27 33*  BUN 22 24* 20 20  CREATININE 0.84 0.70 0.70 0.68  GLUCOSE 138* 137* 153* 168*   Electrolytes  Recent Labs Lab 07/03/14 1556 07/04/14 0540 07/04/14 2355  CALCIUM 9.2 8.9 8.7   Sepsis Markers No results for input(s): LATICACIDVEN, PROCALCITON, O2SATVEN in the last 168 hours. ABG No results for input(s): PHART, PCO2ART, PO2ART in the last 168 hours. Liver Enzymes  Recent Labs Lab 07/03/14 1556  AST 45*  ALT 30  ALKPHOS 135*  BILITOT 0.9  ALBUMIN 2.7*   Cardiac Enzymes No results for input(s): TROPONINI, PROBNP in the last 168 hours. Glucose No results for input(s): GLUCAP in the last 168 hours.  Imaging Dg Chest 2 View  07/04/2014   CLINICAL DATA:  Thrombectomy yesterday.  EXAM: CHEST  2 VIEW  COMPARISON:  07/03/2014  FINDINGS: Previous median sternotomy and CABG procedure. Stable heart size. Bilateral airspace opacities are again identified and appear unchanged from previous exam.  IMPRESSION: No change in aeration the lungs compared with previous exam.   Electronically Signed   By: Kerby Moors M.D.   On: 07/04/2014 09:08   Ct Angio Chest Aorta W/cm &/or  Wo/cm  07/04/2014   CLINICAL DATA:  Hypoxia shortness of breath, COPD and pneumonia. Left brachial and ulnar arm thrombectomy eat yesterday for embolic occlusion.  EXAM: CT ANGIOGRAPHY CHEST WITH CONTRAST  TECHNIQUE: Multidetector CT imaging of the chest was performed using the standard protocol during bolus administration of intravenous contrast. Multiplanar CT image reconstructions and MIPs were obtained to evaluate the vascular anatomy.  CONTRAST:  53mL OMNIPAQUE IOHEXOL 350 MG/ML SOLN  COMPARISON:  07/02/2014 CTA at [facility] and prior unenhanced CT of the chest on 03/30/2011  FINDINGS: Compared to the most recent CTA 2 days ago, there is stable diffuse ground-glass airspace disease and fibrotic changes throughout the right lung and also throughout the lower lingula and left lower lobe. Much of this may be on the basis of chronic fibrotic and interstitial lung disease. However, changes are dramatically worse compared to a CT dated 03/30/2011. Component of active pneumonia cannot be excluded in either lung. No airway obstruction or significant bronchiectasis. There is decrease in pleural fluid since the prior study with trace left pleural fluid remaining.  A number of small mediastinal lymph nodes are again seen. The largest in the prevascular region shows decrease in size in the last 2 days with short axis measurement of 13 mm. There is no evidence of pulmonary embolism. The thoracic aorta shows no evidence of thrombus or dissection. Visualized proximal great vessels are normally patent. 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  Stable mild cardiac enlargement. No pericardial fluid. The visualized upper abdomen is unremarkable. No bony abnormalities.  Review of the MIP images confirms the above findings.  IMPRESSION: 1. Stable severe bilateral pulmonary disease, more extensive throughout the right lung. This again is suggestive of severe  fibrotic lung disease but there may be superimposed pneumonia based on significant change in appearance by CT since 2012. 2. No evidence of pulmonary embolism. 3. No thrombus identified in the thoracic aorta or visualized proximal great vessels. 4. 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   Electronically Signed  By: Aletta Edouard M.D.   On: 07/04/2014 13:49     ASSESSMENT / PLAN:  PULMONARY Acute respiratory failure  COPD  HCAP  ?pneumonitis v aspiration  Hemoptysis - resolved  P:   Supplemental O2 to keep sats >92%  Cont BD's - duoneb, pulmicort  Aggressive pulm hygiene  F/u CXR  Cont solumedrol  Cont abx - change to HCAP coverage  Send RVP  Send autoimmune w/u  Speech eval   Nickolas Madrid, NP 07/05/2014  9:34 AM Pager: [phone] or [phone]  Hypoxemic respiratory failure, likely PNA, patient has severe COPD and will likely take longer to improve.  Once closer to discharge will need an ambulatory desat study.  Changed to vanc/zosyn.  F/U on culture.  Patient seen and examined, agree with above note.  I dictated the care and orders written for this patient under my direction.  Rush Farmer, MD [phone]

## 2014-07-05 NOTE — Progress Notes (Addendum)
ANTICOAGULATION CONSULT NOTE - Follow Up Consult  Pharmacy Consult for Heparin  Indication: atrial fibrillation  Allergies  Allergen Reactions  . Fentanyl     REACTION: unspecified  . Metoprolol Tartrate     REACTION: sob  . Morphine Sulfate Itching    Patient Measurements: Height: 6\' 2"  (188 cm) Weight: 211 lb 6.7 oz (95.9 kg) IBW/kg (Calculated) : 82.2   Vital Signs: Temp: 97.7 F (36.5 C) (01/25 0755) Temp Source: Oral (01/25 0755) BP: 139/63 mmHg (01/25 0755) Pulse Rate: 83 (01/25 0755)  Labs:  Recent Labs  07/03/14 1556 07/03/14 1612 07/03/14 1948 07/04/14 0540 07/04/14 1421 07/04/14 2355 07/05/14 0833  HGB 11.9* 13.6  --  10.5*  --  10.4*  --   HCT 36.3* 40.0  --  31.4*  --  31.8*  --   PLT 265  --   --  131*  --  240  --   LABPROT 29.1*  --  29.1* 18.3*  --   --   --   INR 2.72*  --  2.72* 1.50*  --   --   --   HEPARINUNFRC  --   --   --   --  <0.10* <0.10* 0.46  CREATININE 0.84 0.70  --  0.70  --  0.68  --     Estimated Creatinine Clearance: 104.2 mL/min (by C-G formula based on Cr of 0.68).   Medical History: Past Medical History  Diagnosis Date  . Emphysema   . OSA (obstructive sleep apnea)   . Asthma   . Collagen vascular disease   . Hypertension       Assessment: 68yom with Hx Afib on warfarin pta - admitted for left brachial and ulnar artery thromboembolectomy - despite being on warfarin with therapeutic INR.  Rec'd FFP  on 1/23, 1/24. Heparin level therapeutic this am, hgb 10.4, plts stable. CTA negative for PE.  Goal of Therapy:  INR 2-3 Heparin level 0.3-0.7 units/ml Monitor platelets by anticoagulation protocol: Yes   Plan:  Continue heparin at 1800 units/hr Daily HL, CBC F/u transition to PO anticoagulation    Hughes Better, PharmD, BCPS Clinical Pharmacist Pager: [phone] 07/05/2014 9:30 AM    Adden  ANTIMICROBIAL CONSULT  -Pt has been on ceftriaxone/azithromycin for 2 days without improvement -Continues to have  SOB, RR up to 32, WBC 17.3, chest CT shows severe bilateral pulmonary disease -Will broaden coverage from CAP to HCAP  Plan -Vancomycin 2 g IV x1, then 1 g IV q8h -Zosyn 3.375 g IV q8h -Monitor renal fx, cultures/RVP panel, VT as indicated   Harvel Quale 07/05/2014 10:53 AM   Adden  -Afternoon confirmatory level was therapeutic -Continue heparin 1800 units/hr  Harvel Quale  07/05/2014 2:43 PM

## 2014-07-05 NOTE — Progress Notes (Addendum)
Progress Note    07/05/2014 7:02 AM 2 Days Post-Op  Subjective:  Breathing better  Afebrile HR 60's-80's Afib 938'H-829'H systolic 37% Venturi mask  Gtts:  Heparin   Filed Vitals:   07/05/14 0420  BP: 111/72  Pulse: 66  Temp:   Resp: 25    Physical Exam: Cardiac:  irregular Lungs:  Less labored today Incisions:  C/d/i with staples in tact Extremities:  + doppler signal left ulnar and palmar arch; motor and sensation are intact left hand; left hand is warm.  CBC    Component Value Date/Time   WBC 17.3* 07/04/2014 2355   RBC 3.72* 07/04/2014 2355   HGB 10.4* 07/04/2014 2355   HCT 31.8* 07/04/2014 2355   PLT 240 07/04/2014 2355   MCV 85.5 07/04/2014 2355   MCH 28.0 07/04/2014 2355   MCHC 32.7 07/04/2014 2355   RDW 13.8 07/04/2014 2355   LYMPHSABS 1.1 07/03/2014 1556   MONOABS 0.9 07/03/2014 1556   EOSABS 0.0 07/03/2014 1556   BASOSABS 0.0 07/03/2014 1556    BMET    Component Value Date/Time   NA 139 07/04/2014 2355   K 3.6 07/04/2014 2355   CL 98 07/04/2014 2355   CO2 33* 07/04/2014 2355   GLUCOSE 168* 07/04/2014 2355   BUN 20 07/04/2014 2355   CREATININE 0.68 07/04/2014 2355   CALCIUM 8.7 07/04/2014 2355   GFRNONAA >90 07/04/2014 2355   GFRAA >90 07/04/2014 2355    INR    Component Value Date/Time   INR 1.50* 07/04/2014 0540     Intake/Output Summary (Last 24 hours) at 07/05/14 1696 Last data filed at 07/05/14 0600  Gross per 24 hour  Intake   2184 ml  Output   2052 ml  Net    132 ml    CTA chest/aorta 07/04/14: IMPRESSION: 1. Stable severe bilateral pulmonary disease, more extensive throughout the right lung. This again is suggestive of severe fibrotic lung disease but there may be superimposed pneumonia based on significant change in appearance by CT since 2012. 2. No evidence of pulmonary embolism. 3. No thrombus identified in the thoracic aorta or visualized proximal great vessels. 4. Stable calcified and thrombosed  pseudoaneurysm of the descending thoracic aorta.  2D Echo 07/04/14: Study Conclusions  - Left ventricle: The cavity size was normal. Wall thickness was increased in a pattern of mild LVH. Systolic function was normal. The estimated ejection fraction was in the range of 55% to 60%. - Mitral valve: There was mild regurgitation. - Left atrium: The atrium was moderately dilated. - Right atrium: The atrium was moderately dilated. - Atrial septum: No defect or patent foramen ovale was identified. - Pulmonary arteries: PA peak pressure: 34 mm Hg (S). - Pericardium, extracardiac: Small posterior pericardial effusion   Assessment:  69 y.o. male is s/p:  Left brachial and ulnar artery thromboembolectomy   2 Days Post-Op  Plan: -pt doing well this am with + doppler signal left ulnar and palmar arch -continue heparin gtt -DVT prophylaxis:  Heparin gtt -will need cardiology consult to determine which agent would be best for this pt as he had an embolus on therapeutic heparin. -pathology for embolus still pending.    Leontine Locket, PA-C [facility] [phone] 07/05/2014 7:02 AM   Addendum  I have independently interviewed and examined the patient, and I agree with the physician assistant's findings.  No bleeding or hematoma at surgical site.  Palpable L ulnar pulse.  Ok to switch over to Lovenox.  Echo  with any cardiac thrombus.  CTA also demonstrates no etiology for thromboembolism to L brachial artery.  However, a large DTA PSA is identified.  It is asx at this time.  However, I would recommend coverage of that PSA with a thoracic endograft once his respiratory status improves, likely on an elective basis.  Will get cardiology's opinion in regards to choice of anticoagulation agent given his brachial and ulnar thomboembolism occurred on coumadin at INR 3.4.  Adele Barthel, MD [facility] [place] Office: [phone] Pager:  [phone]  07/05/2014, 1:31 PM

## 2014-07-05 NOTE — Progress Notes (Signed)
ANTICOAGULATION CONSULT NOTE - Follow Up Consult  Pharmacy Consult for Heparin  Indication: atrial fibrillation  Allergies  Allergen Reactions  . Fentanyl     REACTION: unspecified  . Metoprolol Tartrate     REACTION: sob  . Morphine Sulfate Itching    Patient Measurements: Height: 6\' 2"  (188 cm) Weight: 211 lb 6.7 oz (95.9 kg) IBW/kg (Calculated) : 82.2  Vital Signs: Temp: 97.5 F (36.4 C) (01/24 2331) Temp Source: Axillary (01/24 2331) BP: 117/60 mmHg (01/24 2010) Pulse Rate: 77 (01/24 2010)  Labs:  Recent Labs  07/03/14 1556 07/03/14 1612 07/03/14 1948 07/04/14 0540 07/04/14 1421 07/04/14 2355  HGB 11.9* 13.6  --  10.5*  --  10.4*  HCT 36.3* 40.0  --  31.4*  --  31.8*  PLT 265  --   --  131*  --  240  LABPROT 29.1*  --  29.1* 18.3*  --   --   INR 2.72*  --  2.72* 1.50*  --   --   HEPARINUNFRC  --   --   --   --  <0.10* <0.10*  CREATININE 0.84 0.70  --  0.70  --  0.68    Estimated Creatinine Clearance: 104.2 mL/min (by C-G formula based on Cr of 0.68).  Assessment: 69 y/o M on heparin for afib. Sub-therapeutic heparin level despite rate increase. Other labs as above. No issues per RN.   Goal of Therapy:  Heparin level 0.3-0.7 units/ml Monitor platelets by anticoagulation protocol: Yes   Plan:  -Increase heparin drip to 1800 units/hr -0800 HL -Daily CBC/HL -Monitor for bleeding  Narda Bonds 07/05/2014,12:57 AM
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
## 2014-07-06 ENCOUNTER — Telehealth: Payer: Self-pay | Admitting: Vascular Surgery

## 2014-07-06 DIAGNOSIS — E43 Unspecified severe protein-calorie malnutrition: Secondary | ICD-10-CM

## 2014-07-06 DIAGNOSIS — Z7901 Long term (current) use of anticoagulants: Secondary | ICD-10-CM

## 2014-07-06 DIAGNOSIS — J9601 Acute respiratory failure with hypoxia: Secondary | ICD-10-CM

## 2014-07-06 DIAGNOSIS — I480 Paroxysmal atrial fibrillation: Secondary | ICD-10-CM | POA: Diagnosis present

## 2014-07-06 HISTORY — DX: Unspecified severe protein-calorie malnutrition: E43

## 2014-07-06 LAB — CBC
HEMATOCRIT: 32.2 % — AB (ref 39.0–52.0)
HEMOGLOBIN: 10.4 g/dL — AB (ref 13.0–17.0)
MCH: 27.8 pg (ref 26.0–34.0)
MCHC: 32.3 g/dL (ref 30.0–36.0)
MCV: 86.1 fL (ref 78.0–100.0)
PLATELETS: 245 10*3/uL (ref 150–400)
RBC: 3.74 MIL/uL — AB (ref 4.22–5.81)
RDW: 13.9 % (ref 11.5–15.5)
WBC: 16.7 10*3/uL — ABNORMAL HIGH (ref 4.0–10.5)

## 2014-07-06 LAB — PROCALCITONIN: Procalcitonin: <0.1 ng/mL

## 2014-07-06 LAB — ANCA TITERS
Atypical P-ANCA titer: <1:20 {titer}
P-ANCA: <1:20 {titer}

## 2014-07-06 LAB — BASIC METABOLIC PANEL
ANION GAP: 10 (ref 5–15)
BUN: 22 mg/dL (ref 6–23)
CO2: 30 mmol/L (ref 19–32)
Calcium: 8.5 mg/dL (ref 8.4–10.5)
Chloride: 99 mmol/L (ref 96–112)
Creatinine, Ser: 0.84 mg/dL (ref 0.50–1.35)
GFR, EST NON AFRICAN AMERICAN: 89 mL/min — AB (ref >90)
GLUCOSE: 181 mg/dL — AB (ref 70–99)
Potassium: 3.7 mmol/L (ref 3.5–5.1)
SODIUM: 139 mmol/L (ref 135–145)

## 2014-07-06 LAB — BLOOD GAS, ARTERIAL
ACID-BASE EXCESS: 7.7 mmol/L — AB (ref 0.0–2.0)
Bicarbonate: 31.2 mEq/L — ABNORMAL HIGH (ref 20.0–24.0)
DRAWN BY: 39899
FIO2: 0.45 %
O2 SAT: 89.1 %
PATIENT TEMPERATURE: 98.6
PCO2 ART: 40.5 mmHg (ref 35.0–45.0)
TCO2: 32.5 mmol/L (ref 0–100)
pH, Arterial: 7.499 — ABNORMAL HIGH (ref 7.350–7.450)
pO2, Arterial: 57.3 mmHg — ABNORMAL LOW (ref 80.0–100.0)

## 2014-07-06 LAB — ANA: ANA: NEGATIVE

## 2014-07-06 LAB — HEPARIN LEVEL (UNFRACTIONATED)
HEPARIN UNFRACTIONATED: 1.08 [IU]/mL — AB (ref 0.30–0.70)
Heparin Unfractionated: 0.93 IU/mL — ABNORMAL HIGH (ref 0.30–0.70)

## 2014-07-06 MED ORDER — LEVALBUTEROL HCL 0.63 MG/3ML IN NEBU
0.6300 mg | INHALATION_SOLUTION | Freq: Four times a day (QID) | RESPIRATORY_TRACT | Status: DC
  Administered 2014-07-06 – 2014-07-12 (×24): 0.63 mg via RESPIRATORY_TRACT
  Filled 2014-07-06 (×10): qty 3
  Filled 2014-07-06: qty 0.26
  Filled 2014-07-06 (×3): qty 3
  Filled 2014-07-06: qty 0.26
  Filled 2014-07-06 (×4): qty 3
  Filled 2014-07-06: qty 0.26
  Filled 2014-07-06 (×21): qty 3
  Filled 2014-07-06: qty 0.26
  Filled 2014-07-06 (×11): qty 3

## 2014-07-06 MED ORDER — METHYLPREDNISOLONE SODIUM SUCC 125 MG IJ SOLR
60.0000 mg | Freq: Two times a day (BID) | INTRAMUSCULAR | Status: AC
  Administered 2014-07-06 – 2014-07-08 (×5): 60 mg via INTRAVENOUS
  Filled 2014-07-06 (×4): qty 0.96

## 2014-07-06 MED ORDER — APIXABAN 5 MG PO TABS
5.0000 mg | ORAL_TABLET | Freq: Two times a day (BID) | ORAL | Status: DC
  Administered 2014-07-06 – 2014-07-07 (×3): 5 mg via ORAL
  Filled 2014-07-06 (×5): qty 1

## 2014-07-06 NOTE — Progress Notes (Signed)
Patient appears to be dyspneic and pale. O2 maintaining low 90's on venti mask. MD Coralyn Pear notified, ABG ordered.

## 2014-07-06 NOTE — Progress Notes (Signed)
Dear Doctor: Coralyn Pear This patient has been identified as a candidate for PICC for the following reason (s): drug pH or osmolality (causing phlebitis, infiltration in 24 hours) If you agree, please write an order for the indicated device. For any questions contact the Vascular Access Team at [phone] if no answer, please leave a message.  Thank you for supporting the early vascular access assessment program.

## 2014-07-06 NOTE — Progress Notes (Addendum)
ANTICOAGULATION CONSULT NOTE - Initial Consult  Pharmacy Consult for heparin/eliquis Indication: acute ischemic LUE and atrial fibrillation  Allergies  Allergen Reactions  . Fentanyl     Makes breathing problems worse  . Metoprolol Tartrate     REACTION: sob  . Morphine Sulfate Itching    Patient Measurements: Height: 6\' 2"  (188 cm) Weight: 211 lb 6.7 oz (95.9 kg) IBW/kg (Calculated) : 82.2 Heparin Dosing Weight:   Vital Signs: Temp: 97.5 F (36.4 C) (01/26 1130) Temp Source: Oral (01/26 1130) BP: 138/71 mmHg (01/26 1130) Pulse Rate: 97 (01/26 1130)  Labs:  Recent Labs  07/03/14 1556  07/03/14 1948 07/04/14 0540  07/04/14 2355  07/05/14 1352 07/06/14 0315 07/06/14 1209  HGB 11.9*  < >  --  10.5*  --  10.4*  --   --  10.4*  --   HCT 36.3*  < >  --  31.4*  --  31.8*  --   --  32.2*  --   PLT 265  --   --  131*  --  240  --   --  245  --   LABPROT 29.1*  --  29.1* 18.3*  --   --   --   --   --   --   INR 2.72*  --  2.72* 1.50*  --   --   --   --   --   --   HEPARINUNFRC  --   --   --   --   < > <0.10*  < > 0.61 0.93* 1.08*  CREATININE 0.84  < >  --  0.70  --  0.68  --   --  0.84  --   < > = values in this interval not displayed.  Estimated Creatinine Clearance: 99.2 mL/min (by C-G formula based on Cr of 0.84).   Medical History: Past Medical History  Diagnosis Date  . Emphysema   . OSA (obstructive sleep apnea)   . Asthma   . Collagen vascular disease   . Hypertension     Medications:  See EMR  Assessment: 69 yo male on warfarin PTA for AFib, admitted for L brachial and ulnar artery thromboembolectomy despite therapeutic INR. Received FFP on 1/23 and 1/24.  Heparin level is currently SUPRAtherapeutic. Hgb down from admission 13.6 > 10.4, plts stable and wnl. No signs or symptoms of bleeding.  Plan is to transition pt from warfarin to eliquis given pt embolized while on warfarin with therapeutic INR.  Goal of Therapy:  Heparin level 0.3-0.7  units/ml Monitor platelets by anticoagulation protocol: Yes   Plan:  -Stop heparin -Give Eliquis 5 mg po bid, starting at 1500 -CBC q72h -Monitor for s/sx bleeding -Needs Eliquis education   Hughes Better, PharmD, BCPS Clinical Pharmacist Pager: [phone] 07/06/2014 1:08 PM

## 2014-07-06 NOTE — Progress Notes (Signed)
ANTICOAGULATION CONSULT NOTE - Follow Up Consult  Pharmacy Consult for Heparin  Indication: atrial fibrillation  Allergies  Allergen Reactions  . Fentanyl     Makes breathing problems worse  . Metoprolol Tartrate     REACTION: sob  . Morphine Sulfate Itching    Patient Measurements: Height: 6\' 2"  (188 cm) Weight: 211 lb 6.7 oz (95.9 kg) IBW/kg (Calculated) : 82.2  Vital Signs: Temp: 98.1 F (36.7 C) (01/26 0353) Temp Source: Axillary (01/26 0353) BP: 114/61 mmHg (01/25 2340) Pulse Rate: 75 (01/25 2340)  Labs:  Recent Labs  07/03/14 1556  07/03/14 1948 07/04/14 0540  07/04/14 2355 07/05/14 0833 07/05/14 1352 07/06/14 0315  HGB 11.9*  < >  --  10.5*  --  10.4*  --   --  10.4*  HCT 36.3*  < >  --  31.4*  --  31.8*  --   --  32.2*  PLT 265  --   --  131*  --  240  --   --  245  LABPROT 29.1*  --  29.1* 18.3*  --   --   --   --   --   INR 2.72*  --  2.72* 1.50*  --   --   --   --   --   HEPARINUNFRC  --   --   --   --   < > <0.10* 0.46 0.61 0.93*  CREATININE 0.84  < >  --  0.70  --  0.68  --   --  0.84  < > = values in this interval not displayed.  Estimated Creatinine Clearance: 99.2 mL/min (by C-G formula based on Cr of 0.84).  Assessment: 69 y/o M on heparin for afib. Supra-therapeutic heparin level.   Goal of Therapy:  Heparin level 0.3-0.7 units/ml Monitor platelets by anticoagulation protocol: Yes   Plan:  -Decrease heparin drip to 1600 units/hr -1300 HL -Daily CBC/HL -Monitor for bleeding  Narda Bonds 07/06/2014,5:13 AM

## 2014-07-06 NOTE — Progress Notes (Addendum)
   [facility]  Subjective  - Doing well over all.  He states his left hand feels much better.   Objective 118/68 75 97.6 F (36.4 C) (Oral) 19 96%  Intake/Output Summary (Last 24 hours) at 07/06/14 3009 Last data filed at 07/06/14 0355  Gross per 24 hour  Intake   1164 ml  Output   1550 ml  Net   -386 ml    Palpable radial pulses grossly equal bilaterally Left arm incision C/D/I Distally sensation grossly intact and grip 5/5 Right foot warm to touch well perfused Heart A-fib irregularly irregular  Assessment/Planning: POD #3 Left brachial and ulnar artery thromboembolectomy Palpable radial pulses bilateral disposition from vascular stand point is stable Cardiology recommendations:start him on apixaban when cleared by Dr. Bridgett Larsson And pulmonary. AQ-Fib currently on Heparin IV Lungs wearing venti mask currently O2 sat 96% HCAP on Elmon Kirschner, [facility] 07/06/2014 8:23 AM --  Laboratory Lab Results:  Recent Labs  07/04/14 2355 07/06/14 0315  WBC 17.3* 16.7*  HGB 10.4* 10.4*  HCT 31.8* 32.2*  PLT 240 245   BMET  Recent Labs  07/04/14 2355 07/06/14 0315  NA 139 139  K 3.6 3.7  CL 98 99  CO2 33* 30  GLUCOSE 168* 181*  BUN 20 22  CREATININE 0.68 0.84  CALCIUM 8.7 8.5    COAG Lab Results  Component Value Date   INR 1.50* 07/04/2014   INR 2.72* 07/03/2014   INR 2.72* 07/03/2014   No results found for: PTT   Addendum  I have independently interviewed and examined the patient, and I agree with the physician assistant's findings.  [facility] input.   [place] with starting Apixaban from my viewpoint.  In regards to his DTA PSA, he will need thoracic endograft exclusion once his respiratory status is improved.  Staples out in one week.  Call us if pt still admitted for staple removal.  Pt can follow up with me in 4 weeks.  Adele Barthel, MD [facility] Office:  [phone] Pager: [phone]  07/06/2014, 10:18 AM

## 2014-07-06 NOTE — Progress Notes (Signed)
[facility] PROGRESS NOTE  VINT POLA WUJ:[id number] DOB: 02-19-1946 DOA: 07/03/2014 PCP: Nicholos Johns, MD  Interim summary Patient is a 69 year old with a past medical history of chronic obstructive pulmonary disease, chronic pain syndrome, narcotic dependent, presented as a transfer from [facility] where he initially was admitted on 07/02/2014 with complaints of shortness of breath, productive cough found to be hypoxemic respiratory failure. He was initially worked up with a CT scan of lungs but did not show evidence of pulmonary embolism. Their were findings on CT to suggest pneumonia. This hospitalization was complicated by the development of brachial artery occlusion or which she was transferred to [facility] to be evaluated by vascular surgery. He was taken to the OR on 07/03/2014 undergoing left brachial and ulnar artery thromboembolectomy. Postoperatively he was started on anticoagulation with IV heparin. Cardiology was consulted who recommended transitioning to Eliquis.  With regard to his respiratory status, patient likely having COPD exacerbation precipitated by community acquired pneumonia. He was treated with IV steroids, nebulizers, empiric IV antimicrobial therapy with ceftriaxone and azithromycin. Pulmonary critical care medicine was consulted given lack of improvement from pneumonia/COPD. His antibiotics were broadened. Plan to repeat chest x-ray in a.m. Continue close monitoring in the step down unit.   Assessment/Plan: 1. 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 -Patient transferred to [facility] on 07/03/2014 undergoing left brachial and ulnar artery thromboembolectomy, procedure performed by Dr. Bridgett Larsson of vascular surgery. -He had a CT angiogram performed at [facility] which reportedly did not show evidence of pulmonary embolism. -He continues to be perfusing well on left upper extremity -Repeat CT scan of lungs with IV contrast performed  07/04/2014 showing stable severe bilateral pulmonary disease more extensive throughout right lung. Severe fibrotic lung disease noted. No evidence of pulmonary embolism. Specifically there is no thrombus identified in the thoracic aorta or visualized in the great vessels. -Cardiology recommending starting Eliquis  2.  Acute respiratory failure -Evidenced by a respiratory rate of 32, requiring non-rebreather -Likely secondary to combination of COPD exacerbation and pneumonia -He had a repeat chest x-ray done on 07/04/2014 which did not show change in aeration compared to previous exam. There is bilateral airspace opacities identified remain stable. -As mentioned above he had a repeat CT scan of lungs with IV contrast performed on 07/04/2014 that showed severe bilateral pulmonary disease more extensive throughout right with severe fibrotic lung changes.  -Pulmonary critical care following, recommended decreasing Solu-Medrol to 60 mg IV twice a day. Currently on vancomycin and Zosyn  3.  Community-acquired pneumonia -Repeat chest x-ray showing presence of bilateral airspace opacities -Continue empiric IV antimicrobial therapy with azithromycin and ceftriaxone. He was started on antimicrobials on 07/02/2014. -On 07/05/2014 his antimicrobial regimen was broadened -Plan to repeat chest x-ray 07/07/2014  4.  Chronic obstructive pulmonary disease exacerbation -Continue IV steroids with Solu-Medrol 60 mg every 12 hours, scheduled nebulizer treatments, inhaled steroid, empiric IV antimicrobial therapy -Pulmonary medicine following  5.  Chronic pain syndrome, narcotic dependent -Patient currently taking 30 mg of oxycodone at home every 4 hours as needed. He was started back up on his home regimen    Code Status: Full code Family Communication: Spoke with family who was present at bedside Disposition Plan: Continue close monitoring in the step down unit   Consultants:  Vascular  surgery  Procedures:  Left brachial and ulnar artery thromboembolectomy, procedure performed by Dr. Bridgett Larsson of vascular surgery on 07/03/2014.  Antibiotics:  Ceftriaxone (started on 07/03/2014 stopped on 07/05/2014)  Azithromycin (  started on 07/03/2014 stopped on 07/05/2014)  Zosyn (started on 07/05/2014)  Vancomycin (started on 07/05/2014)  HPI/Subjective:   Objective: Filed Vitals:   07/06/14 1130  BP: 138/71  Pulse: 97  Temp: 97.5 F (36.4 C)  Resp: 23    Intake/Output Summary (Last 24 hours) at 07/06/14 1304 Last data filed at 07/06/14 0800  Gross per 24 hour  Intake 696.47 ml  Output   1100 ml  Net -403.53 ml   Filed Weights   07/03/14 1357  Weight: 95.9 kg (211 lb 6.7 oz)    Exam:   General:  Patient appears to be in mild distress, he is awake, alert, and follows commands, states improvement to shortness of breath  Cardiovascular: Tachycardic, regular rate rhythm normal S1-S2  Respiratory: Has bilateral rhonchi, diminished breath sounds, positive respiratory wheezing  Abdomen: Soft nontender nondistended  Musculoskeletal: Status post left below the knee amputation, left upper extremity. To be perfusing well, pallor and coolness from yesterday's evaluation resolved  Data Reviewed: Basic Metabolic Panel:  Recent Labs Lab 07/03/14 1556 07/03/14 1612 07/04/14 0540 07/04/14 2355 07/06/14 0315  NA 137 135 139 139 139  K 4.0 3.9 4.0 3.6 3.7  CL 97 98 100 98 99  CO2 29  --  27 33* 30  GLUCOSE 138* 137* 153* 168* 181*  BUN 22 24* 20 20 22   CREATININE 0.84 0.70 0.70 0.68 0.84  CALCIUM 9.2  --  8.9 8.7 8.5   Liver Function Tests:  Recent Labs Lab 07/03/14 1556  AST 45*  ALT 30  ALKPHOS 135*  BILITOT 0.9  PROT 6.3  ALBUMIN 2.7*   No results for input(s): LIPASE, AMYLASE in the last 168 hours. No results for input(s): AMMONIA in the last 168 hours. CBC:  Recent Labs Lab 07/03/14 1556 07/03/14 1612 07/04/14 0540 07/04/14 2355  07/06/14 0315  WBC 18.3*  --  17.8* 17.3* 16.7*  NEUTROABS 16.3*  --   --   --   --   HGB 11.9* 13.6 10.5* 10.4* 10.4*  HCT 36.3* 40.0 31.4* 31.8* 32.2*  MCV 85.8  --  87.0 85.5 86.1  PLT 265  --  131* 240 245   Cardiac Enzymes: No results for input(s): CKTOTAL, CKMB, CKMBINDEX, TROPONINI in the last 168 hours. BNP (last 3 results) No results for input(s): PROBNP in the last 8760 hours. CBG: No results for input(s): GLUCAP in the last 168 hours.  Recent Results (from the past 240 hour(s))  MRSA PCR Screening     Status: None   Collection Time: 07/03/14  2:04 PM  Result Value Ref Range Status   MRSA by PCR NEGATIVE NEGATIVE Final    Comment:        The GeneXpert MRSA Assay (FDA approved for NASAL specimens only), is one component of a comprehensive MRSA colonization surveillance program. It is not intended to diagnose MRSA infection nor to guide or monitor treatment for MRSA infections.      Studies: Ct Angio Chest Aorta W/cm &/or Wo/cm  07/04/2014   CLINICAL DATA:  Hypoxia shortness of breath, COPD and pneumonia. Left brachial and ulnar arm thrombectomy eat yesterday for embolic occlusion.  EXAM: CT ANGIOGRAPHY CHEST WITH CONTRAST  TECHNIQUE: Multidetector CT imaging of the chest was performed using the standard protocol during bolus administration of intravenous contrast. Multiplanar CT image reconstructions and MIPs were obtained to evaluate the vascular anatomy.  CONTRAST:  37mL OMNIPAQUE IOHEXOL 350 MG/ML SOLN  COMPARISON:  07/02/2014 CTA at [facility] and prior  unenhanced CT of the chest on 03/30/2011  FINDINGS: Compared to the most recent CTA 2 days ago, there is stable diffuse ground-glass airspace disease and fibrotic changes throughout the right lung and also throughout the lower lingula and left lower lobe. Much of this may be on the basis of chronic fibrotic and interstitial lung disease. However, changes are dramatically worse compared to a CT dated 03/30/2011.  Component of active pneumonia cannot be excluded in either lung. No airway obstruction or significant bronchiectasis. There is decrease in pleural fluid since the prior study with trace left pleural fluid remaining.  A number of small mediastinal lymph nodes are again seen. The largest in the prevascular region shows decrease in size in the last 2 days with short axis measurement of 13 mm. There is no evidence of pulmonary embolism. The thoracic aorta shows no evidence of thrombus or dissection. Visualized proximal great vessels are normally patent. 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  Stable mild cardiac enlargement. No pericardial fluid. The visualized upper abdomen is unremarkable. No bony abnormalities.  Review of the MIP images confirms the above findings.  IMPRESSION: 1. Stable severe bilateral pulmonary disease, more extensive throughout the right lung. This again is suggestive of severe fibrotic lung disease but there may be superimposed pneumonia based on significant change in appearance by CT since 2012. 2. No evidence of pulmonary embolism. 3. No thrombus identified in the thoracic aorta or visualized proximal great vessels. 4. 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   Electronically Signed   By: Aletta Edouard M.D.   On: 07/04/2014 13:49    Scheduled Meds: . atorvastatin  40 mg Oral QHS  . bethanechol  25 mg Oral TID  . budesonide  0.25 mg Nebulization BID  . digoxin  125 mcg Oral Daily  . diltiazem  120 mg Oral Daily  . docusate sodium  100 mg Oral Daily  . furosemide  40 mg Oral Daily  . gabapentin  600 mg Oral BID  . guaiFENesin  1,200 mg Oral BID  . ipratropium  0.5 mg Nebulization Q6H  . levalbuterol  0.63 mg Nebulization Q6H  . methylPREDNISolone (SOLU-MEDROL) injection  60 mg Intravenous Q12H  . pantoprazole  40 mg Oral Daily  . piperacillin-tazobactam (ZOSYN)  IV  3.375 g  Intravenous 3 times per day  . potassium chloride SA  20 mEq Oral Daily  . sodium chloride  3 mL Intravenous Q12H  . sodium chloride  3 mL Intravenous Q12H  . vancomycin  1,000 mg Intravenous Q8H   Continuous Infusions: . heparin 1,600 Units/hr (07/06/14 0800)    Principal Problem:   Brachial artery occlusion, right Active Problems:   COPD (chronic obstructive pulmonary disease) with emphysema   CAP (community acquired pneumonia)   A-fib   Acute respiratory failure   Protein-calorie malnutrition, severe    Time spent: 30 minutes    Kelvin Cellar  [facility] Pager [phone]. If 7PM-7AM, please contact night-coverage at [email or web address], password [facility] 07/06/2014, 1:04 PM  LOS: 3 days

## 2014-07-06 NOTE — Progress Notes (Signed)
No new complaints No distress O2 reqts improving  Filed Vitals:   07/06/14 0353 07/06/14 0700 07/06/14 0705 07/06/14 0829  BP: 118/68  125/71   Pulse: 75  89   Temp: 98.1 F (36.7 C) 97.6 F (36.4 C)    TempSrc: Axillary Oral    Resp: 19  13   Height:      Weight:      SpO2: 96%  94% 97%   45% VM  NAD @ rest Neuro intact HEENT WNL IRIR, no M Diffuse crackles, no wheezes Abd soft, +BS Ext warm, no edema   I have reviewed all of today's lab results. Relevant abnormalities are discussed in the A/P section  No new CXR  IMPRESSION: Acute hypoxic resp failure - gradually improving COPD without acute bronchospasm PNA vs pneumonitis  PLAN/REC: PCT algorithm Decrease methylpred to 60 q 12 hrs and cont to wean as tolerated Cont current abx for now Levalbuterol dose adjusted Check 2 V CXR in AM 1/27  Merton Border, MD ; [facility] service Mobile [phone].  After 5:30 PM or weekends, call [phone]

## 2014-07-06 NOTE — Telephone Encounter (Addendum)
-----   Message from Mena Goes, RN sent at 07/04/2014  3:53 PM EST ----- Regarding: Schedule   ----- Message -----    From: Gabriel Earing, PA-C    Sent: 07/03/2014   6:08 PM      To: Vvs Charge Pool  S/p left brachial and ulnar embolectomy 07/03/14.  F/u with Dr. Bridgett Larsson in 2 weeks.  Thanks, Samantha   07/06/14: lm for pt re appt, dpm

## 2014-07-06 NOTE — Progress Notes (Signed)
Patient Profile: 69 y/o male with history of Afib on Coumadin admitted for acute left upper extremity ischemia in the setting of thrombus in brachial artery extending in to the ulnar artery. INR on arrival was actually supratherapeutic at 3.3. Coumadin was reversed with vitamin K to allow for urgent vascular surgery. Day 1 s/p left brachial and ulnar artery thromboembolectomy.    Subjective: No complaints. LUE pain resolved.   Objective: Vital signs in last 24 hours: Temp:  [97.4 F (36.3 C)-98.5 F (36.9 C)] 97.6 F (36.4 C) (01/26 0700) Pulse Rate:  [61-101] 97 (01/26 1130) Resp:  [13-32] 23 (01/26 1130) BP: (108-138)/(46-71) 138/71 mmHg (01/26 1130) SpO2:  [76 %-100 %] 95 % (01/26 1130) FiO2 (%):  [45 %] 45 % (01/26 1130) Last BM Date: 07/06/14  Intake/Output from previous day: 01/25 0701 - 01/26 0700 In: 1422 [P.O.:1080; I.V.:342] Out: 1550 [Urine:1550] Intake/Output this shift: Total I/O In: 222.5 [P.O.:120; I.V.:102.5] Out: -   Medications Current Facility-Administered Medications  Medication Dose Route Frequency Provider Last Rate Last Dose  . 0.9 %  sodium chloride infusion  250 mL Intravenous PRN Kelvin Cellar, MD      . acetaminophen (TYLENOL) tablet 650 mg  650 mg Oral Q6H PRN Kelvin Cellar, MD       Or  . acetaminophen (TYLENOL) suppository 650 mg  650 mg Rectal Q6H PRN Kelvin Cellar, MD      . alum & mag hydroxide-simeth (MAALOX/MYLANTA) 200-200-20 MG/5ML suspension 30 mL  30 mL Oral Q6H PRN Kelvin Cellar, MD      . atorvastatin (LIPITOR) tablet 40 mg  40 mg Oral QHS Kelvin Cellar, MD   40 mg at 07/05/14 2119  . bethanechol (URECHOLINE) tablet 25 mg  25 mg Oral TID Kelvin Cellar, MD   25 mg at 07/06/14 1030  . budesonide (PULMICORT) nebulizer solution 0.25 mg  0.25 mg Nebulization BID Marijean Heath, NP   0.25 mg at 07/06/14 0827  . digoxin (LANOXIN) tablet 125 mcg  125 mcg Oral Daily Kelvin Cellar, MD   125 mcg at 07/06/14 1030  . diltiazem  (CARDIZEM CD) 24 hr capsule 120 mg  120 mg Oral Daily Kelvin Cellar, MD   120 mg at 07/06/14 1143  . docusate sodium (COLACE) capsule 100 mg  100 mg Oral Daily Hulen Shouts Rhyne, PA-C   100 mg at 07/05/14 3710  . furosemide (LASIX) tablet 40 mg  40 mg Oral Daily Kelvin Cellar, MD   40 mg at 07/06/14 1028  . gabapentin (NEURONTIN) tablet 600 mg  600 mg Oral BID Kelvin Cellar, MD   600 mg at 07/06/14 1029  . guaiFENesin (MUCINEX) 12 hr tablet 1,200 mg  1,200 mg Oral BID Kelvin Cellar, MD   1,200 mg at 07/06/14 1029  . guaiFENesin-dextromethorphan (ROBITUSSIN DM) 100-10 MG/5ML syrup 15 mL  15 mL Oral Q4H PRN Samantha J Rhyne, PA-C      . heparin ADULT infusion 100 units/mL (25000 units/250 mL)  1,600 Units/hr Intravenous Continuous Narda Bonds, RPH 16 mL/hr at 07/06/14 0800 1,600 Units/hr at 07/06/14 0800  . HYDROmorphone (DILAUDID) injection 0.5-1 mg  0.5-1 mg Intravenous Q2H PRN Hulen Shouts Rhyne, PA-C   1 mg at 07/04/14 0754  . ipratropium (ATROVENT) nebulizer solution 0.5 mg  0.5 mg Nebulization Q6H Kelvin Cellar, MD   0.5 mg at 07/06/14 0827  . levalbuterol (XOPENEX) nebulizer solution 0.63 mg  0.63 mg Nebulization Q6H Wilhelmina Mcardle, MD      . LORazepam (ATIVAN) injection  1 mg  1 mg Intravenous Q4H PRN Kelvin Cellar, MD   1 mg at 07/06/14 1044  . methylPREDNISolone sodium succinate (SOLU-MEDROL) 125 mg/2 mL injection 60 mg  60 mg Intravenous Q12H Wilhelmina Mcardle, MD      . ondansetron [facility]) tablet 4 mg  4 mg Oral Q6H PRN Kelvin Cellar, MD       Or  . ondansetron (ZOFRAN) injection 4 mg  4 mg Intravenous Q6H PRN Kelvin Cellar, MD      . oxyCODONE (Oxy IR/ROXICODONE) immediate release tablet 30 mg  30 mg Oral Q4H PRN Kelvin Cellar, MD   30 mg at 07/06/14 0748  . pantoprazole (PROTONIX) EC tablet 40 mg  40 mg Oral Daily Samantha J Rhyne, PA-C   40 mg at 07/06/14 1044  . phenol (CHLORASEPTIC) mouth spray 1 spray  1 spray Mouth/Throat PRN Samantha J Rhyne, PA-C      .  piperacillin-tazobactam (ZOSYN) IVPB 3.375 g  3.375 g Intravenous 3 times per day Jake Church Masters, RPH   3.375 g at 07/06/14 1044  . potassium chloride SA (K-DUR,KLOR-CON) CR tablet 20 mEq  20 mEq Oral Daily Kelvin Cellar, MD   20 mEq at 07/06/14 1029  . potassium chloride SA (K-DUR,KLOR-CON) CR tablet 20-40 mEq  20-40 mEq Oral Daily PRN Samantha J Rhyne, PA-C      . sodium chloride 0.9 % injection 3 mL  3 mL Intravenous Q12H Kelvin Cellar, MD   3 mL at 07/06/14 1044  . sodium chloride 0.9 % injection 3 mL  3 mL Intravenous Q12H Kelvin Cellar, MD   3 mL at 07/06/14 1045  . sodium chloride 0.9 % injection 3 mL  3 mL Intravenous PRN Kelvin Cellar, MD      . vancomycin (VANCOCIN) IVPB 1000 mg/200 mL premix  1,000 mg Intravenous Q8H Jake Church Masters, RPH   1,000 mg at 07/06/14 1143    PE: General appearance: alert, cooperative and no distress Neck: no carotid bruit and no JVD Lungs: clear to auscultation bilaterally Heart: irregularly irregular rhythm Extremities: no LEE Pulses: 2+ and symmetric Skin: warm and dry Neurologic: Grossly normal  Lab Results:   Recent Labs  07/04/14 0540 07/04/14 2355 07/06/14 0315  WBC 17.8* 17.3* 16.7*  HGB 10.5* 10.4* 10.4*  HCT 31.4* 31.8* 32.2*  PLT 131* 240 245   BMET  Recent Labs  07/04/14 0540 07/04/14 2355 07/06/14 0315  NA 139 139 139  K 4.0 3.6 3.7  CL 100 98 99  CO2 27 33* 30  GLUCOSE 153* 168* 181*  BUN 20 20 22   CREATININE 0.70 0.68 0.84  CALCIUM 8.9 8.7 8.5   PT/INR  Recent Labs  07/03/14 1556 07/03/14 1948 07/04/14 0540  LABPROT 29.1* 29.1* 18.3*  INR 2.72* 2.72* 1.50*     Assessment/Plan    Principal Problem:   Brachial artery occlusion, right Active Problems:   COPD (chronic obstructive pulmonary disease) with emphysema   CAP (community acquired pneumonia)   A-fib   Acute respiratory failure   Protein-calorie malnutrition, severe  1. Acute Ischemic LUE: Day 1 s/p urgent left brachial and ulnar  artery thromboembolectomy. Continue care per vascular surgery.   2. Thrombotic Event: occurred in the setting of supra therapeutic INR.  Recommend hypercoagulable w/u further down the road. Will defer to primary cardiologist, Dr. Bettina Gavia in [place]. Patient is to discontinue Coumadin and start Eliquis today. Please see detailed consult note from Dr. Caryl Comes, Electrophysiologist, on 07/05/14 regarding reasoning to convert to  Eliquis. Based on age, weight and Scr, he qualifies for full dose of 5 mg BID. Will start tonight. Continue IV heparin until this PM. Will consult pharmacy to assist with dosing schedule/ discontinuation of IV heparin.   3. Atrial Fibrillation: Rate currently slightly elevated in the 110s, but patient is asymptomatic. He just received his PO Cardizem at 11:43, less than 20 min ago and digoxin less than 1 hr ago. Will allow time for meds to take affect. Can further increase Cardizem if additional rate control is needed and if BP allows.   Recommend f/u with Dr. Bettina Gavia post discharge.     LOS: 3 days    Brittainy M. Rosita Fire, PA-C 07/06/2014 11:54 AM  I have seen, examined and evaluated the patient this PM along with Ms. Simmons.  After reviewing all the available data and chart,  I agree with her findings, examination as well as impression recommendations.  See long dissertation by Dr. Caryl Comes re converting to NOAC --> just OK'd by Vasc Sgx - will write for Eliquis.- usually stop IV Heparin after dose given. Will need Hypercoag w/u as OP - ?? Why he has a thrombotic event on Warfarin (that appears to have been therapeutic)  Afib rates are borderline controlled, but AVN agents just given.  Will monitor rates to see if dose needs increasing.  Will reassess in AM.   Leonie Man, M.D., M.S. Interventional Cardiologist   Pager # [phone]

## 2014-07-07 ENCOUNTER — Inpatient Hospital Stay (HOSPITAL_COMMUNITY): Payer: Medicare Other

## 2014-07-07 DIAGNOSIS — I82629 Acute embolism and thrombosis of deep veins of unspecified upper extremity: Secondary | ICD-10-CM | POA: Insufficient documentation

## 2014-07-07 DIAGNOSIS — I481 Persistent atrial fibrillation: Secondary | ICD-10-CM

## 2014-07-07 DIAGNOSIS — I4819 Other persistent atrial fibrillation: Secondary | ICD-10-CM | POA: Insufficient documentation

## 2014-07-07 DIAGNOSIS — I82622 Acute embolism and thrombosis of deep veins of left upper extremity: Secondary | ICD-10-CM

## 2014-07-07 LAB — URINALYSIS, ROUTINE W REFLEX MICROSCOPIC
BILIRUBIN URINE: NEGATIVE
Glucose, UA: NEGATIVE mg/dL
Hgb urine dipstick: NEGATIVE
Ketones, ur: NEGATIVE mg/dL
Leukocytes, UA: NEGATIVE
NITRITE: NEGATIVE
Protein, ur: NEGATIVE mg/dL
SPECIFIC GRAVITY, URINE: 1.028 (ref 1.005–1.030)
Urobilinogen, UA: 0.2 mg/dL (ref 0.0–1.0)
pH: 6 (ref 5.0–8.0)

## 2014-07-07 LAB — CBC
HCT: 35.5 % — ABNORMAL LOW (ref 39.0–52.0)
HEMOGLOBIN: 11.4 g/dL — AB (ref 13.0–17.0)
MCH: 28 pg (ref 26.0–34.0)
MCHC: 32.1 g/dL (ref 30.0–36.0)
MCV: 87.2 fL (ref 78.0–100.0)
Platelets: 277 10*3/uL (ref 150–400)
RBC: 4.07 MIL/uL — ABNORMAL LOW (ref 4.22–5.81)
RDW: 14 % (ref 11.5–15.5)
WBC: 16.8 10*3/uL — AB (ref 4.0–10.5)

## 2014-07-07 MED ORDER — APIXABAN 5 MG PO TABS
10.0000 mg | ORAL_TABLET | Freq: Two times a day (BID) | ORAL | Status: DC
  Administered 2014-07-07 – 2014-07-12 (×10): 10 mg via ORAL
  Filled 2014-07-07 (×11): qty 2

## 2014-07-07 MED ORDER — DILTIAZEM HCL ER COATED BEADS 180 MG PO CP24
180.0000 mg | ORAL_CAPSULE | Freq: Every day | ORAL | Status: DC
  Administered 2014-07-07: 180 mg via ORAL
  Filled 2014-07-07: qty 1

## 2014-07-07 MED ORDER — APIXABAN 5 MG PO TABS: 5.0000 mg | ORAL_TABLET | Freq: Two times a day (BID) | ORAL | Status: DC

## 2014-07-07 MED ORDER — DILTIAZEM HCL ER COATED BEADS 240 MG PO CP24
240.0000 mg | ORAL_CAPSULE | Freq: Every day | ORAL | Status: DC
  Administered 2014-07-08: 240 mg via ORAL
  Filled 2014-07-07: qty 1

## 2014-07-07 NOTE — Progress Notes (Signed)
No new complaints. Continues to improve subjectively No distress. O2 reqts cont to improve  Filed Vitals:   07/07/14 0203 07/07/14 0355 07/07/14 0737 07/07/14 1100  BP:  117/60    Pulse:  80    Temp:  97.8 F (36.6 C) 97.6 F (36.4 C) 97.6 F (36.4 C)  TempSrc:  Oral Oral Oral  Resp:  17    Height:      Weight:      SpO2: 96% 94% 92%    45% VM  NAD @ rest Neuro intact HEENT WNL IRIR, rate controlled, no M Diffuse crackles, no wheezes Abd soft, +BS Ext warm, no edema   I have reviewed all of today's lab results. Relevant abnormalities are discussed in the A/P section  CXR: [place]  IMPRESSION: Acute hypoxic resp failure - gradually improving subjectively COPD without acute bronchospasm PNA vs pneumonitis  PCT normal  PLAN/REC: Cont PCT algorithm Cont methylpred at 60 q 12 hrs  DC Vanc in absence of evidence of a resitant GPC infection Cont Zosyn for now Cont current BD regimen Wean O2 as tolerated  Monitor CXR intermittently   Merton Border, MD ; [facility] service Mobile [phone].  After 5:30 PM or weekends, call [phone]

## 2014-07-07 NOTE — Progress Notes (Signed)
ANTICOAGULATION CONSULT NOTE - Initial Consult  Pharmacy Consult for apixaban Indication: acute ischemic LUE and atrial fibrillation  Allergies  Allergen Reactions  . Fentanyl     Makes breathing problems worse  . Metoprolol Tartrate     REACTION: sob  . Morphine Sulfate Itching    Patient Measurements: Height: 6\' 2"  (188 cm) Weight: 211 lb 6.7 oz (95.9 kg) IBW/kg (Calculated) : 82.2 Heparin Dosing Weight:   Vital Signs: Temp: 97.6 F (36.4 C) (01/27 1100) Temp Source: Oral (01/27 1100) BP: 117/60 mmHg (01/27 0355) Pulse Rate: 80 (01/27 0355)  Labs:  Recent Labs  07/04/14 2355  07/05/14 1352 07/06/14 0315 07/06/14 1209 07/07/14 0355  HGB 10.4*  --   --  10.4*  --  11.4*  HCT 31.8*  --   --  32.2*  --  35.5*  PLT 240  --   --  245  --  277  HEPARINUNFRC <0.10*  < > 0.61 0.93* 1.08*  --   CREATININE 0.68  --   --  0.84  --   --   < > = values in this interval not displayed.  Estimated Creatinine Clearance: 99.2 mL/min (by C-G formula based on Cr of 0.84).   Medical History: Past Medical History  Diagnosis Date  . Emphysema   . OSA (obstructive sleep apnea)   . Asthma   . Collagen vascular disease   . Hypertension     Medications:  See EMR  Assessment: 69 yo male on warfarin PTA for AFib, admitted for L brachial and ulnar artery thromboembolectomy despite therapeutic INR. Received FFP on 1/23 and 1/24. Heparin level is currently SUPRAtherapeutic. Hgb down from admission 13.6 > 11.4, plts stable and wnl. No signs or symptoms of bleeding. Plan is to transition pt from warfarin to eliquis given pt embolized while on warfarin with therapeutic INR.  Goal of Therapy:  Monitor platelets by anticoagulation protocol: Yes   Plan:  Per discussion with cardiology will increase apixaban to 10 mg po bid for initial 7 days then will resume 5 mg po bid on Feb 2  Monitor CBC q72h Watch for s/sx bleeding Will update pt   Hughes Better, PharmD, BCPS Clinical  Pharmacist Pager: [phone] 07/07/2014 3:01 PM

## 2014-07-07 NOTE — Evaluation (Signed)
Physical Therapy Evaluation Patient Details Name: OSMANI KERSTEN MRN: [id number] DOB: [birth year] Today's Date: 07/07/2014   History of Present Illness  69 y.o. male with a past medical history of COPD, AFib, chronic pain and L BKA.  He presented to [facility] on 07/02/2014 with complaints of increasing shortness of breath, productive cough, found to be in hypoxemic respiratory failure. His hospitalization was complicated by development of left brachial and ulnar artery occlusion. He was transferred to [facility] and underwent left brachial and ulnar artery thrombectomy per Dr. Bridgett Larsson 07/03/2014  Clinical Impression  Patient demonstrates deficits in functional mobility as indicated below. Will need continued skilled PT to address deficits and maximize function. Will see as indicated and progress as tolerated.  OF NOTE: patient with elevated HR with minimal activity HR 150s SpO2 dropped on 4 liters to 84% with transfer, increased to 5 liters and cued for pursed lip breathing. Several minutes sitting on BSC before HR dropped to 100s and SpO2 improved to >90%. Patient reporting bladder spasm, nsg aware, advised and left on BSC.    Follow Up Recommendations Home health PT    Equipment Recommendations  None recommended by PT    Recommendations for Other Services       Precautions / Restrictions Precautions Precautions: Fall Precaution Comments: watch O2 sats and HR Restrictions Weight Bearing Restrictions: No      Mobility  Bed Mobility Overal bed mobility: Needs Assistance Bed Mobility: Sit to Supine     Supine to sit: Supervision        Transfers Overall transfer level: Needs assistance Equipment used: 1 person hand held assist Transfers: Sit to/from Stand;Stand Pivot Transfers Sit to Stand: Min guard Stand pivot transfers: Min assist       General transfer comment: VCs for safety and stability  Ambulation/Gait                Stairs            Wheelchair  Mobility    Modified Rankin (Stroke Patients Only)       Balance Overall balance assessment: Needs assistance     Sitting balance - Comments: able to perform dynamic sitting EOB while donning prosthesis     Standing balance-Leahy Scale: Poor Standing balance comment: instability noted                             Pertinent Vitals/Pain Pain Assessment: Discomort pain from bladder spasms; repositioning, monitored during session and premedicated. Nursing aware. Faces scale of 8/10     Home Living Family/patient expects to be discharged to:: Private residence Living Arrangements: Spouse/significant other Available Help at Discharge: Family;Available 24 hours/day Type of Home: House Home Access: Stairs to enter;Ramped entrance       Home Equipment: Grab bars - toilet;Shower seat;Shower seat - built in;Walker - 4 wheels;Cane - single point;Adaptive equipment      Prior Function Level of Independence: Independent with assistive device(s)         Comments: used cane and walker     Hand Dominance        Extremity/Trunk Assessment               Lower Extremity Assessment: Generalized weakness;LLE deficits/detail   LLE Deficits / Details: L BKA history     Communication   Communication: No difficulties  Cognition Arousal/Alertness: Awake/alert Behavior During Therapy: WFL for tasks assessed/performed Overall Cognitive Status: Within Functional Limits for tasks assessed  General Comments      Exercises        Assessment/Plan    PT Assessment Patient needs continued PT services  PT Diagnosis Difficulty walking;Acute pain   PT Problem List Decreased activity tolerance;Decreased balance;Decreased mobility;Pain  PT Treatment Interventions DME instruction;Gait training;Stair training;Functional mobility training;Therapeutic activities;Therapeutic exercise;Balance training;Patient/family education   PT Goals  (Current goals can be found in the Care Plan section) Acute Rehab PT Goals Patient Stated Goal: not stated PT Goal Formulation: With patient Time For Goal Achievement: 07/21/14 Potential to Achieve Goals: Good    Frequency Min 3X/week   Barriers to discharge        Co-evaluation               End of Session Equipment Utilized During Treatment: Oxygen Activity Tolerance: Patient limited by fatigue;Other (comment) (bladder spasms Nsg aware) Patient left: Other (comment);with call bell/phone within reach;with family/visitor present (on bedside commode)           Time: 1610-9604 PT Time Calculation (min) (ACUTE ONLY): 26 min   Charges:   PT Evaluation $Initial PT Evaluation Tier I: 1 Procedure PT Treatments $Therapeutic Activity: 8-22 mins   PT G CodesDuncan Dull Jul 08, 2014, 5:05 PM Alben Deeds, [place] DPT  [phone]

## 2014-07-07 NOTE — Progress Notes (Signed)
Subjective: No complaints  Objective: Vital signs in last 24 hours: Temp:  [97.6 F (36.4 C)-98 F (36.7 C)] 97.6 F (36.4 C) (01/27 1100) Pulse Rate:  [80-89] 80 (01/27 0355) Resp:  [13-27] 17 (01/27 0355) BP: (109-124)/(58-70) 117/60 mmHg (01/27 0355) SpO2:  [92 %-97 %] 92 % (01/27 0737) FiO2 (%):  [45 %] 45 % (01/27 0737) Weight change:  Last BM Date: 07/06/14 Intake/Output from previous day: -3142  (-2927 since admit- though increasing) 01/26 0701 - 01/27 0700 In: 582.5 [P.O.:480; I.V.:102.5] Out: 3725 [Urine:3725] Intake/Output this shift: Total I/O In: -  Out: 1000 [Urine:1000]  PE: General:Pleasant affect, NAD Skin:Warm and dry, brisk capillary refill HEENT:normocephalic, sclera clear, mucus membranes moist Neck:supple, no JVD, no bruits  Heart:S1S2 RRR without murmur, gallup, rub or click Lungs:clear without rales, rhonchi, or wheezes CMK:LKJZ, non tender, + BS, do not palpate liver spleen or masses Ext:no lower ext edema, 2+ pedal pulses, 2+ radial pulses Neuro:alert and oriented, MAE, follows commands, + facial symmetry Tele:  A fib with PVCs  Lab Results:  Recent Labs  07/06/14 0315 07/07/14 0355  WBC 16.7* 16.8*  HGB 10.4* 11.4*  HCT 32.2* 35.5*  PLT 245 277   BMET  Recent Labs  07/04/14 2355 07/06/14 0315  NA 139 139  K 3.6 3.7  CL 98 99  CO2 33* 30  GLUCOSE 168* 181*  BUN 20 22  CREATININE 0.68 0.84  CALCIUM 8.7 8.5     Studies/Results: Dg Chest 2 View  07/07/2014   CLINICAL DATA:  69 year old male with a history of shortness of breath  EXAM: CHEST - 2 VIEW  COMPARISON:  CT 07/04/2014, chest x-ray 07/04/2014, 07/03/2014  FINDINGS: Cardiomediastinal silhouette unchanged in size and contour, partially obscured by overlying lung and pleural disease.  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  Surgical changes of median sternotomy and CABG.  Similar pattern and distribution of  interstitial and airspace disease of the bilateral lungs, sparing the superior left lung.  No pleural effusion.  No pneumothorax.  No displaced fracture.  Similar appearance of the vertebral bodies better visualized.  IMPRESSION: Similar distribution and appearance of mixed airspace and interstitial disease of the right greater than left lungs. Aeration at the superior left lung is maintained.  Surgical changes of prior median sternotomy and CABG.  Calcified pseudoaneurysm of the descending aorta, better characterized on prior CT.  Signed,  Dulcy Fanny. Earleen Newport, DO  [facility]  [facility]   Electronically Signed   By: Corrie Mckusick D.O.   On: 07/07/2014 07:50    Medications: I have reviewed the patient's current medications. Scheduled Meds: . apixaban  5 mg Oral BID  . atorvastatin  40 mg Oral QHS  . bethanechol  25 mg Oral TID  . budesonide  0.25 mg Nebulization BID  . digoxin  125 mcg Oral Daily  . diltiazem  180 mg Oral Daily  . docusate sodium  100 mg Oral Daily  . furosemide  40 mg Oral Daily  . gabapentin  600 mg Oral BID  . guaiFENesin  1,200 mg Oral BID  . ipratropium  0.5 mg Nebulization Q6H  . levalbuterol  0.63 mg Nebulization Q6H  . methylPREDNISolone (SOLU-MEDROL) injection  60 mg Intravenous Q12H  . pantoprazole  40 mg Oral Daily  . piperacillin-tazobactam (ZOSYN)  IV  3.375 g Intravenous 3 times per day  . potassium chloride SA  20 mEq Oral Daily  .  sodium chloride  3 mL Intravenous Q12H  . sodium chloride  3 mL Intravenous Q12H   Continuous Infusions:  PRN Meds:.sodium chloride, acetaminophen **OR** acetaminophen, alum & mag hydroxide-simeth, guaiFENesin-dextromethorphan, HYDROmorphone (DILAUDID) injection, LORazepam, ondansetron **OR** ondansetron (ZOFRAN) IV, oxyCODONE, phenol, sodium chloride  Assessment/Plan:  69 y/o male with history of Afib on Coumadin admitted for acute left upper extremity ischemia in the setting of  thrombus in brachial artery extending in to the ulnar artery. INR on arrival was actually supratherapeutic at 3.3. Coumadin was reversed with vitamin K to allow for urgent vascular surgery. Day 1 s/p left brachial and ulnar artery thromboembolectomy.   Principal Problem:  Brachial artery occlusion, right Active Problems:  COPD (chronic obstructive pulmonary disease) with emphysema  CAP (community acquired pneumonia)  A-fib  Acute respiratory failure  Protein-calorie malnutrition, severe  1. Acute Ischemic LUE: Day 1 s/p urgent left brachial and ulnar artery thromboembolectomy. Continue care per vascular surgery.   2. Thrombotic Event: occurred in the setting of supra therapeutic INR. Recommend hypercoagulable w/u further down the road. Will defer to primary cardiologist, Dr. Bettina Gavia in [place]. Patient is to discontinue Coumadin and start Eliquis today. Please see detailed consult note from Dr. Caryl Comes, Electrophysiologist, on 07/05/14 regarding reasoning to convert to Eliquis. Based on age, weight and Scr, he qualifies for full dose of 5 mg BID. Will start tonight. Continue IV heparin until this PM. Will consult pharmacy to assist with dosing schedule/ discontinuation of IV heparin. ? Increase Eliquis to 10 mg with hx of clot on therapeutic coumadin 10 mg BID for 7 days  Then back to 5 mg BID   3. Atrial Fibrillation: Rate currently slightly elevated in the 110s, but patient is asymptomatic. He just received his PO Cardizem at 11:43, less than 20 min ago and digoxin less than 1 hr ago. Will allow time for meds to take affect. Can further increase Cardizem if additional rate control is needed and if BP allows.   Acute hypoxic resp failure - gradually improving subjectively followed by PCCM COPD without acute bronchospasm PNA vs pneumonitis PCT normal  LOS: 4 days   Time spent with pt. :15 minutes. [facility] R  Nurse Practitioner Certified Pager [phone] or after 5pm and  on weekends call [phone] 07/07/2014, 2:38 PM  I have seen, examined and evaluated the patient this PM along with Ms. Dorene Ar, NP.  After reviewing all the available data and chart,  I agree with her findings, examination as well as impression recommendations.  We discussed the above recommendations. Interesting scenario with acute back in the setting of pulmonary embolus the treatment course with Eliquis is to start with 10 mg twice a day for 7 days then 5 mg twice a day. Since his presentation was with an acute thrombotic event while on anticoagulation, I think as per living is reasonable.  Heart rate seems to be somewhat improved with increased dose of Cardizem. He still has rates that are getting into the 110-120 beats per minute.diltiazem was increased to 180 mg, I think are probably safe going as high as 240 mg for next dose  At present his cardiac issues are secondary. Remainder of his care is per PCCM & TRH.  Will continue to follow heart rates.   Leonie Man, M.D., M.S. Interventional Cardiologist   Pager # [phone]

## 2014-07-07 NOTE — Progress Notes (Signed)
Occupational Therapy Treatment Patient Details Name: Grant Santos MRN: [id number] DOB: February 23, 1946 Today's Date: 07/07/2014    History of present illness 68 y.o. male with a past medical history of chronic obstructive pulmonary disease, history of left lower extremity amputation, presented as a transfer from [facility]. Patient was admitted to [facility] on 07/02/2014 presenting with complaints of increasing shortness of breath, productive cough, found to be in hypoxemic respiratory failure. He was worked up with a CT scan of lungs which did not show evidence of pulmonary embolism however did show evidence of pneumonia with superimposed COPD changes. Pt s/p left brachial and ulnar artery thromboembolectomy   OT comments  Pt performed ADLs. Pt with increased HR and O2 dropping in session (see vital section below). Recommending PT consult and nurse notified.  Follow Up Recommendations  Home health OT;Supervision/Assistance - 24 hour    Equipment Recommendations  None recommended by OT    Recommendations for Other Services PT consult    Precautions / Restrictions Precautions Precautions: Fall Precaution Comments: watch O2 sats and HR Restrictions Weight Bearing Restrictions: No       Mobility Bed Mobility Overal bed mobility: Needs Assistance Bed Mobility: Supine to Sit;Sit to Supine     Supine to sit: Supervision Sit to supine: Supervision      Transfers Overall transfer level: Needs assistance Equipment used: Straight cane Transfers: Sit to/from Stand Sit to Stand: Min guard                  ADL Overall ADL's : Needs assistance/impaired     Grooming: Applying deodorant;Set up;Supervision/safety;Sitting;Wash/dry face   Upper Body Bathing: Set up;Supervision/ safety;Sitting   Lower Body Bathing: Min guard;Sit to/from stand   Upper Body Dressing : Set up;Supervision/safety;Sitting   Lower Body Dressing: Min guard;Sit to/from stand;Bed level  (pulled underwear over bottom in bed)   Toilet Transfer: Min guard (sit to stand from bed with cane)             General ADL Comments: Pt stood EOB and had cramp in leg and some chest tightness. Pt's O2 in 80's. Educated on energy conservation and breathing technique. Pt taking breaks in session to recover.      Vision                     Perception     Praxis      Cognition  Awake/Alert Behavior During Therapy: WFL for tasks assessed/performed Overall Cognitive Status: Within Functional Limits for tasks assessed                       Extremity/Trunk Assessment               Exercises     Shoulder Instructions       General Comments      Pertinent Vitals/ Pain       Pain Assessment: No/denies pain; did report tightness in chest and cramp in leg at beginning of session, but no pain at end of session.  **Pt on venti mask at 45% on 10L and O2 dropping to 80's (took break and with deep breathing would increase to 90's). Ended up bumping pt up to 15L at 45%. Pt took mask off to wash face briefly and O2 dropping to 70's. Pt back on 10L on 45% at end of session and O2 in 90's. Noted RR to to up to 30-40's in session and HR up to 170's.  Home Living                                          Prior Functioning/Environment              Frequency Min 2X/week     Progress Toward Goals  OT Goals(current goals can now be found in the care plan section)  Progress towards OT goals: Not progressing toward goals - comment (assist levels same this session)  Acute Rehab OT Goals Patient Stated Goal: not stated OT Goal Formulation: With patient Time For Goal Achievement: 07/12/14 Potential to Achieve Goals: Good ADL Goals Pt Will Perform Grooming: with modified independence;standing Pt Will Perform Upper Body Bathing: with modified independence;standing;sitting Pt Will Perform Lower Body Bathing: with modified independence;sit  to/from stand Pt Will Perform Lower Body Dressing: with modified independence;sit to/from stand Pt Will Transfer to Toilet: with modified independence;ambulating Pt Will Perform Toileting - Clothing Manipulation and hygiene: with modified independence;sit to/from stand Additional ADL Goal #1: Pt will independently verbalize and demonstrate 3/3 energy conservation techniques.  Plan Discharge plan remains appropriate    Co-evaluation                 End of Session Equipment Utilized During Treatment: Gait belt;Oxygen (cane)   Activity Tolerance Patient limited by fatigue   Patient Left in bed;with call bell/phone within reach   Nurse Communication Other (comment) (O2 sats; HR; PT consult)        Time: 2800-3491 OT Time Calculation (min): 34 min  Charges: OT General Charges $OT Visit: 1 Procedure OT Treatments $Self Care/Home Management : 23-37 mins  Benito Mccreedy OTR/L [phone] 07/07/2014, 1:02 PM

## 2014-07-07 NOTE — Progress Notes (Signed)
[facility] PROGRESS NOTE  Grant Santos NLG:[id number] DOB: 11-Mar-1946 DOA: 07/03/2014 PCP: Nicholos Johns, MD  Interim summary Patient is a 69 year old with a past medical history of chronic obstructive pulmonary disease, chronic pain syndrome, narcotic dependent, presented as a transfer from [facility] where he initially was admitted on 07/02/2014 with complaints of shortness of breath, productive cough found to be hypoxemic respiratory failure. He was initially worked up with a CT scan of lungs but did not show evidence of pulmonary embolism. Their were findings on CT to suggest pneumonia. This hospitalization was complicated by the development of brachial artery occlusion or which she was transferred to [facility] to be evaluated by vascular surgery. He was taken to the OR on 07/03/2014 undergoing left brachial and ulnar artery thromboembolectomy. Postoperatively he was started on anticoagulation with IV heparin. Cardiology was consulted who recommended transitioning to Eliquis. With regard to his respiratory status, patient likely having COPD exacerbation precipitated by community acquired pneumonia. He was treated with IV steroids, nebulizers, empiric IV antimicrobial therapy with ceftriaxone and azithromycin. Pulmonary critical care medicine was consulted given lack of improvement from pneumonia/COPD. His antibiotics were broadened. Plan to repeat chest x-ray in a.m. Continue close monitoring in the step down unit.   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 Bridgett Larsson 07/03/2014. Patient had CT angiogram performed the [facility] which reportedly did not show any evidence of PE.  Repeat CT of the lungs with IV contrast performed 07/04/2014 with stable severe bilateral pulmonary disease more extensive throughout right lung. Severe fibrotic lung disease  noted. No evidence of PE. Specifically there is no thrombus identified in the thoracic aorta or visualized in the great vessels.  Patient with good perfusion in the left upper extremity.  Patient has been started on eliquis per cardiology recommendations. Outpatient follow-up with vascular surgery.  #2 acute hypoxic respiratory failure Likely multifactorial secondary to pulmonary fibrosis in the setting of COPD exacerbation and pneumonia. Patient had chest x-ray done 07/04/2014 which did not show any changes to prior chest x-ray. Repeat chest x-ray done today 07/07/2014 with similar distribution and appearance of mixed airspace and interstitial disease of the right greater than left lungs. Aeration of the superior left lung is maintained.  Repeat CT performed 07/04/2014 showed severe bilateral pulmonary disease more extensive throughout the right with severe fibrotic lung changes.  Patient on Venturi mask with increased oxygen requirements. Patient has been seen by pulmonary and critical care medicine. Continue IV steroids, scheduled nebulizers, oxygen, empiric antibiotics of IV vancomycin and IV Zosyn.  #3 community acquired pneumonia Repeat chest x-ray shows per the bilateral airspace opacities. Continue empiric IV vancomycin and IV Zosyn as recommended by pulmonary. Repeat chest x-ray with no significant change. Follow.  #4 acute COPD exacerbation No wheezing noted on examination. Continue current dose of IV steroids, scheduled nebulizers, Pulmicort, empiric IV antibiotics. Pulmonary critical care medicine following and appreciate input and recommendations.  #5 chronic pain syndrome narcotic dependent Stable. Continue current regimen.  #6 atrial fibrillation Patient with heart rates in the 110s have asymptomatic. Patient on Cardizem and dig oxygen. Will increase Cardizem 180 mg daily. Continue eliquis for anticoagulation. Cardiology following.  #7 dysuria Check a UA with cultures and  sensitivities. Patient on empiric IV antibiotics.  #8 prophylaxis PPI for GI prophylaxis. On Eliquis for anticoagulation.  Code Status: Full Family Communication: Updated patient no family at bedside. Disposition Plan:  Remain the step down unit   Consultants:  Vascular surgery: Dr. Bridgett Larsson 07/03/2014  PCCM Dr. Nelda Marseille 07/05/2014  Cardiology: Dr. Caryl Comes 07/05/2014  Procedures:  Left brachial and ulnar artery thrombectomy per Dr. Bridgett Larsson Vascular surgery 07/03/2014  CT chest 07/04/2014  2-D echo 07/04/2014  Chest x-ray 07/04/2014, 07/07/2014  Antibiotics:  IV Rocephin 07/03/2014>>> 07/05/2014  IV azithromycin 07/03/2014>>>> 07/05/2014  IV vancomycin 07/05/2014  IV Zosyn 07/05/2014  HPI/Subjective: Patient complaining of labored breathing on minimal exertion. Patient also complaining of some dysuria. Patient on Venturi mask.  Objective: Filed Vitals:   07/07/14 0355  BP: 117/60  Pulse: 80  Temp: 97.8 F (36.6 C)  Resp: 17    Intake/Output Summary (Last 24 hours) at 07/07/14 0843 Last data filed at 07/07/14 0355  Gross per 24 hour  Intake    360 ml  Output   3425 ml  Net  -3065 ml   Filed Weights   07/03/14 1357  Weight: 95.9 kg (211 lb 6.7 oz)    Exam:   General:  NAD  Cardiovascular: Tachycardia, RR  Respiratory: Fine crackles noted right greater than left. No wheezing. No rhonchi.  Abdomen: Soft, nontender, nondistended, positive bowel sounds.  Musculoskeletal: No clubbing or cyanosis. Status post left AKA. Left upper extremity warm, good perfusion.  Data Reviewed: Basic Metabolic Panel:  Recent Labs Lab 07/03/14 1556 07/03/14 1612 07/04/14 0540 07/04/14 2355 07/06/14 0315  NA 137 135 139 139 139  K 4.0 3.9 4.0 3.6 3.7  CL 97 98 100 98 99  CO2 29  --  27 33* 30  GLUCOSE 138* 137* 153* 168* 181*  BUN 22 24* 20 20 22   CREATININE 0.84 0.70 0.70 0.68 0.84  CALCIUM 9.2  --  8.9 8.7 8.5   Liver Function Tests:  Recent Labs Lab  07/03/14 1556  AST 45*  ALT 30  ALKPHOS 135*  BILITOT 0.9  PROT 6.3  ALBUMIN 2.7*   No results for input(s): LIPASE, AMYLASE in the last 168 hours. No results for input(s): AMMONIA in the last 168 hours. CBC:  Recent Labs Lab 07/03/14 1556 07/03/14 1612 07/04/14 0540 07/04/14 2355 07/06/14 0315 07/07/14 0355  WBC 18.3*  --  17.8* 17.3* 16.7* 16.8*  NEUTROABS 16.3*  --   --   --   --   --   HGB 11.9* 13.6 10.5* 10.4* 10.4* 11.4*  HCT 36.3* 40.0 31.4* 31.8* 32.2* 35.5*  MCV 85.8  --  87.0 85.5 86.1 87.2  PLT 265  --  131* 240 245 277   Cardiac Enzymes: No results for input(s): CKTOTAL, CKMB, CKMBINDEX, TROPONINI in the last 168 hours. BNP (last 3 results) No results for input(s): PROBNP in the last 8760 hours. CBG: No results for input(s): GLUCAP in the last 168 hours.  Recent Results (from the past 240 hour(s))  MRSA PCR Screening     Status: None   Collection Time: 07/03/14  2:04 PM  Result Value Ref Range Status   MRSA by PCR NEGATIVE NEGATIVE Final    Comment:        The GeneXpert MRSA Assay (FDA approved for NASAL specimens only), is one component of a comprehensive MRSA colonization surveillance program. It is not intended to diagnose MRSA infection nor to guide or monitor treatment for MRSA infections.      Studies: Dg Chest 2 View  07/07/2014   CLINICAL DATA:  69 year old male with a history of shortness of breath  EXAM: CHEST - 2 VIEW  COMPARISON:  CT 07/04/2014, chest x-ray 07/04/2014, 07/03/2014  FINDINGS: Cardiomediastinal silhouette unchanged in size and contour, partially obscured by overlying lung and pleural disease.  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  Surgical changes of median sternotomy and CABG.  Similar pattern and distribution of interstitial and airspace disease of the bilateral lungs, sparing the superior left lung.  No pleural effusion.  No pneumothorax.  No displaced fracture.   Similar appearance of the vertebral bodies better visualized.  IMPRESSION: Similar distribution and appearance of mixed airspace and interstitial disease of the right greater than left lungs. Aeration at the superior left lung is maintained.  Surgical changes of prior median sternotomy and CABG.  Calcified pseudoaneurysm of the descending aorta, better characterized on prior CT.  Signed,  Dulcy Fanny. Earleen Newport, DO  [facility]  [facility]   Electronically Signed   By: Corrie Mckusick D.O.   On: 07/07/2014 07:50    Scheduled Meds: . apixaban  5 mg Oral BID  . atorvastatin  40 mg Oral QHS  . bethanechol  25 mg Oral TID  . budesonide  0.25 mg Nebulization BID  . digoxin  125 mcg Oral Daily  . diltiazem  120 mg Oral Daily  . docusate sodium  100 mg Oral Daily  . furosemide  40 mg Oral Daily  . gabapentin  600 mg Oral BID  . guaiFENesin  1,200 mg Oral BID  . ipratropium  0.5 mg Nebulization Q6H  . levalbuterol  0.63 mg Nebulization Q6H  . methylPREDNISolone (SOLU-MEDROL) injection  60 mg Intravenous Q12H  . pantoprazole  40 mg Oral Daily  . piperacillin-tazobactam (ZOSYN)  IV  3.375 g Intravenous 3 times per day  . potassium chloride SA  20 mEq Oral Daily  . sodium chloride  3 mL Intravenous Q12H  . sodium chloride  3 mL Intravenous Q12H  . vancomycin  1,000 mg Intravenous Q8H   Continuous Infusions:   Principal Problem:   Brachial artery occlusion, left Active Problems:   Acute respiratory failure   COPD (chronic obstructive pulmonary disease) with emphysema   CAP (community acquired pneumonia)   A-fib   Protein-calorie malnutrition, severe   Atrial fibrillation, unspecified   Current use of long term anticoagulation    Time spent: 32 mins    [facility] MD [facility] Pager [phone]. If 7PM-7AM, please contact night-coverage at [email or web address], password [facility] 07/07/2014, 8:43 AM  LOS: 4 days

## 2014-07-08 ENCOUNTER — Ambulatory Visit: Payer: Self-pay | Admitting: Pulmonary Disease

## 2014-07-08 DIAGNOSIS — J439 Emphysema, unspecified: Secondary | ICD-10-CM

## 2014-07-08 DIAGNOSIS — R918 Other nonspecific abnormal finding of lung field: Secondary | ICD-10-CM | POA: Insufficient documentation

## 2014-07-08 DIAGNOSIS — J189 Pneumonia, unspecified organism: Secondary | ICD-10-CM | POA: Insufficient documentation

## 2014-07-08 LAB — BASIC METABOLIC PANEL
ANION GAP: 12 (ref 5–15)
BUN: 25 mg/dL — AB (ref 6–23)
CHLORIDE: 95 mmol/L — AB (ref 96–112)
CO2: 32 mmol/L (ref 19–32)
Calcium: 8.8 mg/dL (ref 8.4–10.5)
Creatinine, Ser: 0.99 mg/dL (ref 0.50–1.35)
GFR calc Af Amer: >90 mL/min (ref >90)
GFR, EST NON AFRICAN AMERICAN: 83 mL/min — AB (ref >90)
GLUCOSE: 165 mg/dL — AB (ref 70–99)
Potassium: 3.2 mmol/L — ABNORMAL LOW (ref 3.5–5.1)
Sodium: 139 mmol/L (ref 135–145)

## 2014-07-08 LAB — CBC WITH DIFFERENTIAL/PLATELET
BASOS ABS: 0 10*3/uL (ref 0.0–0.1)
BASOS PCT: 0 % (ref 0–1)
Eosinophils Absolute: 0 10*3/uL (ref 0.0–0.7)
Eosinophils Relative: 0 % (ref 0–5)
HCT: 38.5 % — ABNORMAL LOW (ref 39.0–52.0)
Hemoglobin: 12.4 g/dL — ABNORMAL LOW (ref 13.0–17.0)
Lymphocytes Relative: 5 % — ABNORMAL LOW (ref 12–46)
Lymphs Abs: 1 10*3/uL (ref 0.7–4.0)
MCH: 28.4 pg (ref 26.0–34.0)
MCHC: 32.2 g/dL (ref 30.0–36.0)
MCV: 88.1 fL (ref 78.0–100.0)
MONO ABS: 1.1 10*3/uL — AB (ref 0.1–1.0)
Monocytes Relative: 6 % (ref 3–12)
Neutro Abs: 16.4 10*3/uL — ABNORMAL HIGH (ref 1.7–7.7)
Neutrophils Relative %: 89 % — ABNORMAL HIGH (ref 43–77)
Platelets: 316 10*3/uL (ref 150–400)
RBC: 4.37 MIL/uL (ref 4.22–5.81)
RDW: 14 % (ref 11.5–15.5)
WBC: 18.5 10*3/uL — ABNORMAL HIGH (ref 4.0–10.5)

## 2014-07-08 LAB — RHEUMATOID FACTOR: RHEUMATOID FACTOR: 8 [IU]/mL (ref 0–20)

## 2014-07-08 LAB — PROCALCITONIN

## 2014-07-08 MED ORDER — LEVOFLOXACIN 500 MG PO TABS
500.0000 mg | ORAL_TABLET | Freq: Every day | ORAL | Status: AC
  Administered 2014-07-08 – 2014-07-12 (×5): 500 mg via ORAL
  Filled 2014-07-08 (×5): qty 1

## 2014-07-08 MED ORDER — APIXABAN 5 MG PO TABS: 10.0000 mg | ORAL_TABLET | Freq: Two times a day (BID) | ORAL | Status: DC

## 2014-07-08 MED ORDER — PREDNISONE 50 MG PO TABS
60.0000 mg | ORAL_TABLET | Freq: Every day | ORAL | Status: DC
  Administered 2014-07-09 – 2014-07-11 (×3): 60 mg via ORAL
  Filled 2014-07-08 (×4): qty 1

## 2014-07-08 MED ORDER — WHITE PETROLATUM GEL
Status: AC
  Administered 2014-07-08: 09:00:00
  Filled 2014-07-08: qty 1

## 2014-07-08 MED ORDER — DILTIAZEM HCL 25 MG/5ML IV SOLN
10.0000 mg | Freq: Once | INTRAVENOUS | Status: AC | PRN
  Filled 2014-07-08: qty 5

## 2014-07-08 MED ORDER — DILTIAZEM HCL 100 MG IV SOLR
10.0000 mg | Freq: Once | INTRAVENOUS | Status: DC
  Filled 2014-07-08: qty 100

## 2014-07-08 MED ORDER — POTASSIUM CHLORIDE CRYS ER 20 MEQ PO TBCR
40.0000 meq | EXTENDED_RELEASE_TABLET | ORAL | Status: AC
  Administered 2014-07-08 (×2): 40 meq via ORAL
  Filled 2014-07-08 (×2): qty 2

## 2014-07-08 MED ORDER — DILTIAZEM HCL 90 MG PO TABS
180.0000 mg | ORAL_TABLET | Freq: Two times a day (BID) | ORAL | Status: DC
Start: 2014-07-08 — End: 2014-07-09
  Administered 2014-07-08 – 2014-07-09 (×2): 180 mg via ORAL
  Filled 2014-07-08 (×3): qty 2

## 2014-07-08 NOTE — Progress Notes (Signed)
[facility] PROGRESS NOTE  BETHEL GAGLIO GUR:[id number] DOB: Nov 02, 1945 DOA: 07/03/2014 PCP: Nicholos Johns, MD  Interim summary Patient is a 69 year old with a past medical history of chronic obstructive pulmonary disease, chronic pain syndrome, narcotic dependent, presented as a transfer from [facility] where he initially was admitted on 07/02/2014 with complaints of shortness of breath, productive cough found to be hypoxemic respiratory failure. He was initially worked up with a CT scan of lungs but did not show evidence of pulmonary embolism. Their were findings on CT to suggest pneumonia. This hospitalization was complicated by the development of brachial artery occlusion or which she was transferred to [facility] to be evaluated by vascular surgery. He was taken to the OR on 07/03/2014 undergoing left brachial and ulnar artery thromboembolectomy. Postoperatively he was started on anticoagulation with IV heparin. Cardiology was consulted who recommended transitioning to Eliquis. With regard to his respiratory status, patient likely having COPD exacerbation precipitated by community acquired pneumonia. He was treated with IV steroids, nebulizers, empiric IV antimicrobial therapy with ceftriaxone and azithromycin. Pulmonary critical care medicine was consulted given lack of improvement from pneumonia/COPD. His antibiotics were broadened. Plan to repeat chest x-ray in a.m. Continue close monitoring in the step down unit.   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 Bridgett Larsson 07/03/2014. Patient had CT angiogram performed the [facility] which reportedly did not show any evidence of PE.  Repeat CT of the lungs with IV contrast performed 07/04/2014 with stable severe bilateral pulmonary disease more extensive throughout right lung. Severe fibrotic lung disease  noted. No evidence of PE. Specifically there is no thrombus identified in the thoracic aorta or visualized in the great vessels.  Patient with good perfusion in the left upper extremity.  Patient has been started on eliquis per cardiology recommendations. Outpatient follow-up with vascular surgery.  #2 acute hypoxic respiratory failure Likely multifactorial secondary to pulmonary fibrosis in the setting of COPD exacerbation and pneumonia. Patient had chest x-ray done 07/04/2014 which did not show any changes to prior chest x-ray. Repeat chest x-ray done 07/07/2014 with similar distribution and appearance of mixed airspace and interstitial disease of the right greater than left lungs. Aeration of the superior left lung is maintained.  Repeat CT performed 07/04/2014 showed severe bilateral pulmonary disease more extensive throughout the right with severe fibrotic lung changes.  Patient now on 6L [place] and slowly improving. Patient has been seen by pulmonary and critical care medicine. Continue IV steroids, scheduled nebulizers, oxygen, empiric antibiotics of  IV Zosyn. Vancomycin was d/c'd yesterday.  #3 community acquired pneumonia Repeat chest x-ray shows per the bilateral airspace opacities. Continue empiric IV Zosyn as recommended by pulmonary. Repeat chest x-ray yesterday with no significant change. Follow.  #4 acute COPD exacerbation No wheezing noted on examination. Continue current dose of IV steroids, scheduled nebulizers, Pulmicort, empiric IV antibiotics. Pulmonary critical care medicine following and appreciate input and recommendations.  #5 chronic pain syndrome narcotic dependent Stable. Continue current regimen.  #6 atrial fibrillation Patient with heart rates in the 130-140s this mroning. Patient Cardizem dose increased yesterday to 240mg  daily. Continue digoxin. Patient hasn't received medications yet this morning. Continue eliquis for anticoagulation. Cardiology following.  #7  dysuria UA negative. Patient on empiric IV antibiotics.  #8 prophylaxis PPI for GI prophylaxis. On Eliquis for anticoagulation.  Code Status: Full Family Communication: Updated patient no family at bedside. Disposition Plan: Remain  the step down unit   Consultants:  Vascular surgery: Dr. Bridgett Larsson 07/03/2014  PCCM Dr. Nelda Marseille 07/05/2014  Cardiology: Dr. Caryl Comes 07/05/2014  Procedures:  Left brachial and ulnar artery thrombectomy per Dr. Bridgett Larsson Vascular surgery 07/03/2014  CT chest 07/04/2014  2-D echo 07/04/2014  Chest x-ray 07/04/2014, 07/07/2014  Antibiotics:  IV Rocephin 07/03/2014>>> 07/05/2014  IV azithromycin 07/03/2014>>>> 07/05/2014  IV vancomycin 07/05/2014 >>>>>07/07/14  IV Zosyn 07/05/2014  HPI/Subjective: Patient states some improvement with breathing.  Patient on 6L [place]. Increased HR on minimal exertion.  Objective: Filed Vitals:   07/08/14 0726  BP:   Pulse:   Temp: 97.6 F (36.4 C)  Resp:     Intake/Output Summary (Last 24 hours) at 07/08/14 0857 Last data filed at 07/08/14 0328  Gross per 24 hour  Intake     53 ml  Output   2400 ml  Net  -2347 ml   Filed Weights   07/03/14 1357  Weight: 95.9 kg (211 lb 6.7 oz)    Exam:   General:  NAD  Cardiovascular: Tachycardia, irregularly irregular  Respiratory: Fine crackles noted right greater than left. No wheezing. No rhonchi.  Abdomen: Soft, nontender, nondistended, positive bowel sounds.  Musculoskeletal: No clubbing or cyanosis. Status post left BKA. Left upper extremity warm, good perfusion.  Data Reviewed: Basic Metabolic Panel:  Recent Labs Lab 07/03/14 1556 07/03/14 1612 07/04/14 0540 07/04/14 2355 07/06/14 0315 07/08/14 0455  NA 137 135 139 139 139 139  K 4.0 3.9 4.0 3.6 3.7 3.2*  CL 97 98 100 98 99 95*  CO2 29  --  27 33* 30 32  GLUCOSE 138* 137* 153* 168* 181* 165*  BUN 22 24* 20 20 22  25*  CREATININE 0.84 0.70 0.70 0.68 0.84 0.99  CALCIUM 9.2  --  8.9 8.7 8.5 8.8    Liver Function Tests:  Recent Labs Lab 07/03/14 1556  AST 45*  ALT 30  ALKPHOS 135*  BILITOT 0.9  PROT 6.3  ALBUMIN 2.7*   No results for input(s): LIPASE, AMYLASE in the last 168 hours. No results for input(s): AMMONIA in the last 168 hours. CBC:  Recent Labs Lab 07/03/14 1556  07/04/14 0540 07/04/14 2355 07/06/14 0315 07/07/14 0355 07/08/14 0455  WBC 18.3*  --  17.8* 17.3* 16.7* 16.8* 18.5*  NEUTROABS 16.3*  --   --   --   --   --  16.4*  HGB 11.9*  < > 10.5* 10.4* 10.4* 11.4* 12.4*  HCT 36.3*  < > 31.4* 31.8* 32.2* 35.5* 38.5*  MCV 85.8  --  87.0 85.5 86.1 87.2 88.1  PLT 265  --  131* 240 245 277 316  < > = values in this interval not displayed. Cardiac Enzymes: No results for input(s): CKTOTAL, CKMB, CKMBINDEX, TROPONINI in the last 168 hours. BNP (last 3 results) No results for input(s): PROBNP in the last 8760 hours. CBG: No results for input(s): GLUCAP in the last 168 hours.  Recent Results (from the past 240 hour(s))  MRSA PCR Screening     Status: None   Collection Time: 07/03/14  2:04 PM  Result Value Ref Range Status   MRSA by PCR NEGATIVE NEGATIVE Final    Comment:        The GeneXpert MRSA Assay (FDA approved for NASAL specimens only), is one component of a comprehensive MRSA colonization surveillance program. It is not intended to diagnose MRSA infection nor to guide or monitor treatment for MRSA infections.      Studies:  Dg Chest 2 View  07/07/2014   CLINICAL DATA:  69 year old male with a history of shortness of breath  EXAM: CHEST - 2 VIEW  COMPARISON:  CT 07/04/2014, chest x-ray 07/04/2014, 07/03/2014  FINDINGS: Cardiomediastinal silhouette unchanged in size and contour, partially obscured by overlying lung and pleural disease.  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  Surgical changes of median sternotomy and CABG.  Similar pattern and distribution of interstitial and airspace  disease of the bilateral lungs, sparing the superior left lung.  No pleural effusion.  No pneumothorax.  No displaced fracture.  Similar appearance of the vertebral bodies better visualized.  IMPRESSION: Similar distribution and appearance of mixed airspace and interstitial disease of the right greater than left lungs. Aeration at the superior left lung is maintained.  Surgical changes of prior median sternotomy and CABG.  Calcified pseudoaneurysm of the descending aorta, better characterized on prior CT.  Signed,  Dulcy Fanny. Earleen Newport, DO  [facility]  [facility]   Electronically Signed   By: Corrie Mckusick D.O.   On: 07/07/2014 07:50    Scheduled Meds: . apixaban  10 mg Oral BID  . [START ON 07/13/2014] apixaban  5 mg Oral BID  . atorvastatin  40 mg Oral QHS  . bethanechol  25 mg Oral TID  . budesonide  0.25 mg Nebulization BID  . digoxin  125 mcg Oral Daily  . diltiazem  240 mg Oral Daily  . docusate sodium  100 mg Oral Daily  . furosemide  40 mg Oral Daily  . gabapentin  600 mg Oral BID  . guaiFENesin  1,200 mg Oral BID  . ipratropium  0.5 mg Nebulization Q6H  . levalbuterol  0.63 mg Nebulization Q6H  . methylPREDNISolone (SOLU-MEDROL) injection  60 mg Intravenous Q12H  . pantoprazole  40 mg Oral Daily  . piperacillin-tazobactam (ZOSYN)  IV  3.375 g Intravenous 3 times per day  . potassium chloride SA  20 mEq Oral Daily  . potassium chloride  40 mEq Oral Q4H  . sodium chloride  3 mL Intravenous Q12H  . sodium chloride  3 mL Intravenous Q12H  . white petrolatum       Continuous Infusions:   Principal Problem:   Brachial artery occlusion, left Active Problems:   Acute respiratory failure   COPD (chronic obstructive pulmonary disease) with emphysema   CAP (community acquired pneumonia)   A-fib   Protein-calorie malnutrition, severe   Atrial fibrillation, unspecified   Current use of long term anticoagulation   Acute brachial vein  embolism   Persistent atrial fibrillation    Time spent: 43 mins    [facility] MD [facility] Pager [phone]. If 7PM-7AM, please contact night-coverage at [email or web address], password [facility] 07/08/2014, 8:57 AM  LOS: 5 days

## 2014-07-08 NOTE — Care Management Note (Signed)
    Page 1 of 2   07/12/2014     2:11:57 PM CARE MANAGEMENT NOTE 07/12/2014  Patient:  Grant Santos, Grant Santos   Account Number:  [id number]  Date Initiated:  07/07/2014  Documentation initiated by:  Marvetta Gibbons  Subjective/Objective Assessment:   Pt admitted with emergent embolectomy     Action/Plan:   PTA pt lived at home- PT/OT evals   Anticipated DC Date:  07/12/2014   Anticipated DC Plan:  [place]  CM consult      [place]   Choice offered to / List presented to:  C-1 Patient   DME arranged  [place]      DME agency  [facility]     [place] arranged  HH-1 RN  HH-3 OT  [place]      [place] agency  [place]   Status of service:  Completed, signed off Medicare Important Message given?  YES (If response is "NO", the following Medicare IM given date fields will be blank) Date Medicare IM given:  07/08/2014 Medicare IM given by:  Marvetta Gibbons Date Additional Medicare IM given:  07/12/2014 Additional Medicare IM given by:  Marvetta Gibbons  Discharge Disposition:  [place]  Per UR Regulation:  Reviewed for med. necessity/level of care/duration of stay  If discussed at [place] of Stay Meetings, dates discussed:   07/08/2014    Comments:  07/12/14- 1100- Marvetta Gibbons RN, BSN [phone] Pt for d/c home today, will need home 02- per pt he would like to use [facility] for his home 02 has he already has cpap with them- spoke with Leafy Ro at [place]- orders and documentation faxed to [place]- will deliver portable tanks to room for discharge. Also called Abigail Butts at [facility] they are able to take referral for [facility] services today- orders and d/c summary faxed to [facility].  07/08/14- [place] RN, BSN [phone] Spoke with pt at bedside- has orders for HH-RN/PT/OT when pt ready for discharge- per pt he is not on any home 02  still on 6L here, pt does report that he has CPAP and nebulizer at home- through [place]- will follow for any home 02 needs- discussed [place]- pt agreeable to services- states that he had [place] back in Nov- provided choice for [facility]- pt states that he would like to use the same agency that he used before- thinks that it was [place] [facility]- call made to agency to confirm- spoke with Abigail Butts- who confirmed that they did have pt in Nov. - they can not take referral this week but should be able to next week when pt ready for discharge- will call back to confirm this when pt medically stable for d/c- and fax orders to [place] at that time-

## 2014-07-08 NOTE — Progress Notes (Signed)
No new complaints.  No distress. O2 reqts cont to improve  Filed Vitals:   07/08/14 0908 07/08/14 1126 07/08/14 1447 07/08/14 1451  BP: 129/69 118/67 103/62   Pulse: 135 83 77   Temp:  96.5 F (35.8 C)    TempSrc:  Axillary    Resp:  17 18   Height:      Weight:      SpO2:  97% 98% 97%   5 lpm [place]  NAD @ rest Neuro intact HEENT WNL IRIR, rate controlled, no M Improved crackles, no wheezes Abd soft, +BS Ext warm, no edema   I have reviewed all of today's lab results. Relevant abnormalities are discussed in the A/P section  CXR: NNF  IMPRESSION: Acute hypoxic resp failure - gradually improving subjectively COPD without acute bronchospasm PNA vs pneumonitis  PCT normal  PLAN/REC: Cont PCT algorithm Change methylpred to prednisone beginning 1/29 AM  60 mg daily X 3  40 mg daily X 3  20 mg daily X 3  Stop  DC Zosyn after today Levofloxacin X 5 days beginning AM 1/29 Cont current BD regimen Wean O2 as tolerated - he will almost certainly qualify for and benefit from home O2 Please obtain a 2 view CXR on day of discharge I have arranged F/U with Dr Tressie Ellis NP on 02/08 @ 3:15. He will have a CXR done @ that time  PCCM will sign off. Please call if we can be of further assistance    Merton Border, MD ; [facility] [phone].  After 5:30 PM or weekends, call [phone]

## 2014-07-08 NOTE — Discharge Instructions (Signed)
°  Information on my medicine - ELIQUIS (apixaban)  Why was Eliquis prescribed for you? Eliquis was prescribed to treat blood clots that may have been found in the veins of your legs (deep vein thrombosis) or in your lungs (pulmonary embolism) and to reduce the risk of them occurring again.  What do You need to know about Eliquis ? The starting dose is 10 mg (two 5 mg tablets) taken TWICE daily for the FIRST SEVEN (7) DAYS, then on (enter date) TUESDAY July 13, 2014  the dose is reduced to ONE 5 mg tablet taken TWICE daily.  Eliquis may be taken with or without food.   Try to take the dose about the same time in the morning and in the evening. If you have difficulty swallowing the tablet whole please discuss with your pharmacist how to take the medication safely.  Take Eliquis exactly as prescribed and DO NOT stop taking Eliquis without talking to the doctor who prescribed the medication.  Stopping may increase your risk of developing a new blood clot.  Refill your prescription before you run out.  After discharge, you should have regular check-up appointments with your healthcare provider that is prescribing your Eliquis.    What do you do if you miss a dose? If a dose of ELIQUIS is not taken at the scheduled time, take it as soon as possible on the same day and twice-daily administration should be resumed. The dose should not be doubled to make up for a missed dose.  Important Safety Information A possible side effect of Eliquis is bleeding. You should call your healthcare provider right away if you experience any of the following: ? Bleeding from an injury or your nose that does not stop. ? Unusual colored urine (red or dark brown) or unusual colored stools (red or black). ? Unusual bruising for unknown reasons. ? A serious fall or if you hit your head (even if there is no bleeding).  Some medicines may interact with Eliquis and might increase your risk of bleeding or clotting  while on Eliquis. To help avoid this, consult your healthcare provider or pharmacist prior to using any new prescription or non-prescription medications, including herbals, vitamins, non-steroidal anti-inflammatory drugs (NSAIDs) and supplements.  This website has more information on Eliquis (apixaban): http://www.eliquis.com/eliquis/home

## 2014-07-08 NOTE — Progress Notes (Signed)
Subjective: No complaints, HR up to 160 this am at times, during night rate controlled.  Objective: Vital signs in last 24 hours: Temp:  [97.5 F (36.4 C)-97.8 F (36.6 C)] 97.6 F (36.4 C) (01/28 0726) Pulse Rate:  [66-135] 135 (01/28 0908) Resp:  [15-28] 28 (01/28 0729) BP: (111-135)/(54-73) 129/69 mmHg (01/28 0908) SpO2:  [90 %-98 %] 95 % (01/28 0808) Weight change:  Last BM Date: 07/04/14 Intake/Output from previous day: -2497 01/27 0701 - 01/28 0700 In: 79 [I.V.:3; IV Piggyback:50] Out: 2550 [Urine:2550] Intake/Output this shift:    PE: General:Pleasant affect, NAD Skin:Warm and dry, brisk capillary refill HEENT:normocephalic, sclera clear, mucus membranes moist Heart:irreg irreg without murmur, gallup, rub or click Lungs:clear without rales, rhonchi, or wheezes EZM:OQHU, non tender, + BS, do not palpate liver spleen or masses Ext:no lower ext edema,Lt leg BKA, 2+ radial pulses Neuro:alert and oriented X 3, MAE, follows commands, + facial symmetry TELE:  A fib with RVR at times to 160 with minimal activity   Lab Results:  Recent Labs  07/07/14 0355 07/08/14 0455  WBC 16.8* 18.5*  HGB 11.4* 12.4*  HCT 35.5* 38.5*  PLT 277 316   BMET  Recent Labs  07/06/14 0315 07/08/14 0455  NA 139 139  K 3.7 3.2*  CL 99 95*  CO2 30 32  GLUCOSE 181* 165*  BUN 22 25*  CREATININE 0.84 0.99  CALCIUM 8.5 8.8      Studies/Results: Dg Chest 2 View  07/07/2014   CLINICAL DATA:  69 year old male with a history of shortness of breath  EXAM: CHEST - 2 VIEW  COMPARISON:  CT 07/04/2014, chest x-ray 07/04/2014, 07/03/2014  FINDINGS: Cardiomediastinal silhouette unchanged in size and contour, partially obscured by overlying lung and pleural disease.  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  Surgical changes of median sternotomy and CABG.  Similar pattern and distribution of interstitial and airspace disease of  the bilateral lungs, sparing the superior left lung.  No pleural effusion.  No pneumothorax.  No displaced fracture.  Similar appearance of the vertebral bodies better visualized.  IMPRESSION: Similar distribution and appearance of mixed airspace and interstitial disease of the right greater than left lungs. Aeration at the superior left lung is maintained.  Surgical changes of prior median sternotomy and CABG.  Calcified pseudoaneurysm of the descending aorta, better characterized on prior CT.  Signed,  Dulcy Fanny. Earleen Newport, DO  [facility]  [facility]   Electronically Signed   By: Corrie Mckusick D.O.   On: 07/07/2014 07:50    Medications: I have reviewed the patient's current medications. Scheduled Meds: . apixaban  10 mg Oral BID  . [START ON 07/13/2014] apixaban  5 mg Oral BID  . atorvastatin  40 mg Oral QHS  . bethanechol  25 mg Oral TID  . budesonide  0.25 mg Nebulization BID  . digoxin  125 mcg Oral Daily  . diltiazem  240 mg Oral Daily  . docusate sodium  100 mg Oral Daily  . furosemide  40 mg Oral Daily  . gabapentin  600 mg Oral BID  . guaiFENesin  1,200 mg Oral BID  . ipratropium  0.5 mg Nebulization Q6H  . levalbuterol  0.63 mg Nebulization Q6H  . methylPREDNISolone (SOLU-MEDROL) injection  60 mg Intravenous Q12H  . pantoprazole  40 mg Oral Daily  . piperacillin-tazobactam (ZOSYN)  IV  3.375 g Intravenous 3 times  per day  . potassium chloride SA  20 mEq Oral Daily  . potassium chloride  40 mEq Oral Q4H  . sodium chloride  3 mL Intravenous Q12H  . sodium chloride  3 mL Intravenous Q12H   Continuous Infusions:  PRN Meds:.sodium chloride, acetaminophen **OR** acetaminophen, alum & mag hydroxide-simeth, guaiFENesin-dextromethorphan, HYDROmorphone (DILAUDID) injection, LORazepam, ondansetron **OR** ondansetron (ZOFRAN) IV, oxyCODONE, phenol, sodium chloride  Assessment/Plan: 69 y/o male with history of Afib on Coumadin admitted for acute  left upper extremity ischemia in the setting of thrombus in brachial artery extending in to the ulnar artery. INR on arrival was actually supratherapeutic at 3.3. Coumadin was reversed with vitamin K to allow for urgent vascular surgery. Day 1 s/p left brachial and ulnar artery thromboembolectomy.   Principal Problem:  Brachial artery occlusion, right Active Problems:  COPD (chronic obstructive pulmonary disease) with emphysema  CAP (community acquired pneumonia)  A-fib  Acute respiratory failure  Protein-calorie malnutrition, severe  1. Acute Ischemic LUE: Day 1 s/p urgent left brachial and ulnar artery thromboembolectomy. Continue care per vascular surgery.   2. Thrombotic Event: occurred in the setting of supra therapeutic INR. Recommend hypercoagulable w/u further down the road. Will defer to primary cardiologist, Dr. Bettina Gavia in [place]. Patient is to discontinue Coumadin and start Eliquis today. Please see detailed consult note from Dr. Caryl Comes, Electrophysiologist, on 07/05/14 regarding reasoning to convert to Eliquis. Based on age, weight and Scr, he qualifies for full dose of 5 mg BID. Will start tonight. Continue IV heparin until this PM. Will consult pharmacy to assist with dosing schedule/ discontinuation of IV heparin.  Increase Eliquis to 10 mg with hx of clot on therapeutic coumadin 10 mg BID for 7 days Then back to 5 mg BID   3. Atrial Fibrillation: Rate currently slightly elevated in the 110s and with just sitting up in bed to 150-160, but patient is asymptomatic. rec'd 250 mg dilt at 0908 today. Is on dig. And has rec'd for today.    Acute hypoxic resp failure - gradually improving subjectively followed by PCCM COPD without acute bronchospasm PNA vs pneumonitis PCT normal  Hypokalemia replacing Leukocytosis, is on steroids   LOS: 5 days   Time spent with pt. :15 minutes. [facility] R  Nurse Practitioner Certified Pager [phone] or after 5pm and on  weekends call [phone] 07/08/2014, 10:19 AM   I have seen, examined and evaluated the patient this AM along with Ms. Ingold, NP-C.  After reviewing all the available data and chart,  I agree with her findings, examination as well as impression recommendations.  A. fib rate is still difficult to control. It appears that his heart rate goes up in the waning hours of his 24-hour medication. I will convert him to 100 mg twice a day of the 12 hour medication as opposed to the 24-hour medication. I have also written for when necessary diltiazem. I would like to get his rate better controlled as I think he is otherwise probably ready for discharge from a cardiac standpoint. He issues on ELIQUIS.  Leonie Man, M.D., M.S. Interventional Cardiologist   Pager # [phone]

## 2014-07-08 NOTE — Progress Notes (Signed)
Medicare Important Message given? YES  (If response is "NO", the following Medicare IM given date fields will be blank)  Date Medicare IM given: 07/08/14 Medicare IM given by:  Dahlia Client [employer]

## 2014-07-09 ENCOUNTER — Inpatient Hospital Stay (HOSPITAL_COMMUNITY): Payer: Medicare Other

## 2014-07-09 DIAGNOSIS — J449 Chronic obstructive pulmonary disease, unspecified: Secondary | ICD-10-CM | POA: Insufficient documentation

## 2014-07-09 LAB — CBC
HCT: 36.3 % — ABNORMAL LOW (ref 39.0–52.0)
HEMOGLOBIN: 11.6 g/dL — AB (ref 13.0–17.0)
MCH: 27.9 pg (ref 26.0–34.0)
MCHC: 32 g/dL (ref 30.0–36.0)
MCV: 87.3 fL (ref 78.0–100.0)
Platelets: 290 10*3/uL (ref 150–400)
RBC: 4.16 MIL/uL — AB (ref 4.22–5.81)
RDW: 14.3 % (ref 11.5–15.5)
WBC: 21.1 10*3/uL — ABNORMAL HIGH (ref 4.0–10.5)

## 2014-07-09 LAB — BASIC METABOLIC PANEL
Anion gap: 6 (ref 5–15)
BUN: 24 mg/dL — ABNORMAL HIGH (ref 6–23)
CO2: 36 mmol/L — AB (ref 19–32)
CREATININE: 1.01 mg/dL (ref 0.50–1.35)
Calcium: 8.7 mg/dL (ref 8.4–10.5)
Chloride: 96 mmol/L (ref 96–112)
GFR calc non Af Amer: 75 mL/min — ABNORMAL LOW (ref >90)
GFR, EST AFRICAN AMERICAN: 87 mL/min — AB (ref >90)
GLUCOSE: 176 mg/dL — AB (ref 70–99)
Potassium: 4.1 mmol/L (ref 3.5–5.1)
Sodium: 138 mmol/L (ref 135–145)

## 2014-07-09 MED ORDER — DILTIAZEM HCL 90 MG PO TABS
180.0000 mg | ORAL_TABLET | Freq: Two times a day (BID) | ORAL | Status: DC
  Administered 2014-07-09 – 2014-07-12 (×6): 180 mg via ORAL
  Filled 2014-07-09 (×7): qty 2

## 2014-07-09 MED ORDER — DILTIAZEM HCL 60 MG PO TABS
120.0000 mg | ORAL_TABLET | Freq: Two times a day (BID) | ORAL | Status: DC
  Filled 2014-07-09: qty 2

## 2014-07-09 NOTE — Progress Notes (Addendum)
Subjective: No complaints  Objective: Vital signs in last 24 hours: Temp:  [96.4 F (35.8 C)-97.7 F (36.5 C)] 97.7 F (36.5 C) (01/29 0703) Pulse Rate:  [39-83] 39 (01/29 0757) Resp:  [13-18] 13 (01/29 0757) BP: (97-118)/(57-68) 114/68 mmHg (01/29 0757) SpO2:  [87 %-98 %] 87 % (01/29 0908) FiO2 (%):  [40 %] 40 % (01/29 0318) Weight change:  Last BM Date: 07/08/14 Intake/Output from previous day: -1440  (-6814 since admit) 01/28 0701 - 01/29 0700 In: 410 [P.O.:360; IV Piggyback:50] Out: 1850 [Urine:1850] Intake/Output this shift:    PE: up in chair eating. Lungs clear Heart irrirreg Tele:  A fib with one episode to 39 last pm, now mostly in 60-70s.  dilt decreased to 120 BID   Lab Results:  Recent Labs  07/08/14 0455 07/09/14 0247  WBC 18.5* 21.1*  HGB 12.4* 11.6*  HCT 38.5* 36.3*  PLT 316 290   BMET  Recent Labs  07/08/14 0455 07/09/14 0247  NA 139 138  K 3.2* 4.1  CL 95* 96  CO2 32 36*  GLUCOSE 165* 176*  BUN 25* 24*  CREATININE 0.99 1.01  CALCIUM 8.8 8.7   No results for input(s): TROPONINI in the last 72 hours.  Invalid input(s): CK, MB     Studies/Results: Dg Chest 2 View  07/09/2014   CLINICAL DATA:  Followup pulmonary infiltrates.  EXAM: CHEST  2 VIEW  COMPARISON:  07/07/2014  FINDINGS: The cardiac silhouette, mediastinal and hilar contours are stable. Stable tortuosity and ectasia of the thoracic aorta. Stable underlying emphysematous changes with superimposed airspace process, slightly improved. No pleural effusion or pneumothorax.  IMPRESSION: Underlying emphysema and superimposed asymmetric airspace process slightly improved since prior chest x-ray.   Electronically Signed   By: Kalman Jewels M.D.   On: 07/09/2014 07:44    Medications: I have reviewed the patient's current medications. Scheduled Meds: . apixaban  10 mg Oral BID  . atorvastatin  40 mg Oral QHS  . bethanechol  25 mg Oral TID  . budesonide  0.25 mg  Nebulization BID  . digoxin  125 mcg Oral Daily  . diltiazem  180 mg Oral Q12H  . docusate sodium  100 mg Oral Daily  . furosemide  40 mg Oral Daily  . gabapentin  600 mg Oral BID  . ipratropium  0.5 mg Nebulization Q6H  . levalbuterol  0.63 mg Nebulization Q6H  . levofloxacin  500 mg Oral Daily  . pantoprazole  40 mg Oral Daily  . potassium chloride SA  20 mEq Oral Daily  . predniSONE  60 mg Oral Q breakfast  . sodium chloride  3 mL Intravenous Q12H   Continuous Infusions:  PRN Meds:.sodium chloride, acetaminophen **OR** acetaminophen, alum & mag hydroxide-simeth, guaiFENesin-dextromethorphan, HYDROmorphone (DILAUDID) injection, LORazepam, ondansetron **OR** ondansetron (ZOFRAN) IV, oxyCODONE, phenol, sodium chloride   Assessment/Plan: 69 y/o male with history of Afib on Coumadin admitted for acute left upper extremity ischemia in the setting of thrombus in brachial artery extending in to the ulnar artery. INR on arrival was actually supratherapeutic at 3.3. Coumadin was reversed with vitamin K to allow for urgent vascular surgery. Day 1 s/p left brachial and ulnar artery thromboembolectomy.   Principal Problem:  Brachial artery occlusion, right Active Problems:  COPD (chronic obstructive pulmonary disease) with emphysema  CAP (community acquired pneumonia)  A-fib  Acute respiratory failure  Protein-calorie malnutrition, severe  1. Acute Ischemic LUE: Day 1 s/p urgent left brachial and  ulnar artery thromboembolectomy. Continue care per vascular surgery.   2. Thrombotic Event: occurred in the setting of supra therapeutic INR. Recommend hypercoagulable w/u further down the road. Will defer to primary cardiologist, Dr. Bettina Gavia in [place]. Patient is to discontinue Coumadin and start Eliquis today. Please see detailed consult note from Dr. Caryl Comes, Electrophysiologist, on 07/05/14 regarding reasoning to convert to Eliquis. Based on age, weight and Scr, he qualifies for full dose of  5 mg BID. Will start tonight. Continue IV heparin until this PM. Will consult pharmacy to assist with dosing schedule/ discontinuation of IV heparin. Increase Eliquis to 10 mg with hx of clot on therapeutic coumadin 10 mg BID for 7 days Then back to 5 mg BID   3. Atrial Fibrillation: Rate currently slightly elevated in the 110s and yesterday just sitting up in bed to 150-160, but patient is asymptomatic. Dilt changed to 180 mg BID,  Is on dig.  But with some brady dilt decreased to 120 mg BID.  If HR increases then would go back to the 180 BID.   4.  Borderline BP with increase of dilt will monitor Acute hypoxic resp failure - gradually improving subjectively followed by PCCM COPD without acute bronchospasm PNA vs pneumonitis PCT normal  Hypokalemia replaced and stable. Leukocytosis, is on steroids though WBC climbing   LOS: 6 days   Time spent with pt. :15 minutes. [facility] R  Nurse Practitioner Certified Pager [phone] or after 5pm and on weekends call [phone] 07/09/2014, 10:05 AM   I personally saw and evaluated the patient today after he was transferred to the telemetry floor. I have reviewed the chart and discussed the findings, examination and recommendations above with Cecilie Kicks, NP. I agree with her note.  He was feeling well without any major complaints. Actually breathing better but still on oxygen. Heart rates were low but erratic overnight as low as 39 as high as 150. My suspicion is the combination of the 24-hour plus the 12 hour diltiazem a in the setting of him sleeping lead to the low heart rates. During the day is normalizes out into the 60s. I think 180 mg twice a day is a good standing dose. For the morning dose today I decreased it to 120 mg.  Otherwise he is now on ELIQUIS with plans to complete 7 days of 10 mg twice a day and then reduced to 5 mg twice a day for standing treatment.  Provided his heart rates remain stable, he should be okay from a  cardiac standpoint for discharge with current plan. He has outpatient cardiologist in [facility].  We'll check in on him over the weekend to ensure that his regimen is stable prior to discharge.  Leonie Man, M.D., M.S. Interventional Cardiologist   Pager # [phone]

## 2014-07-09 NOTE — Progress Notes (Signed)
REceived orders to transfer pt, pt aware of transfer and wife notified. No s/s of acute distress noted. Report called to [employer] on 2W.

## 2014-07-09 NOTE — Progress Notes (Signed)
Occupational Therapy Treatment Patient Details Name: Grant Santos MRN: [id number] DOB: 02-13-1946 Today's Date: 07/09/2014    History of present illness pt presents post L brachial and ulnar artery thrombectomy with PNA and hx of COPD and L BKA.     OT comments  Focus of session on instruction in energy conservation strategies and toilet transfer.  Pt unable to release walker to perform standing grooming.  02 sats on 6L decreased to 80% with short distance ambulation.  Follow Up Recommendations  Home health OT;Supervision/Assistance - 24 hour    Equipment Recommendations  None recommended by OT    Recommendations for Other Services      Precautions / Restrictions Precautions Precautions: Fall Precaution Comments: Watch O2 sats.  pt with L LE prosthetic Restrictions Weight Bearing Restrictions: No       Mobility Bed Mobility Overal bed mobility: Modified Independent Bed Mobility: Supine to Sit;Sit to Supine     Supine to sit: Modified independent (Device/Increase time) Sit to supine: Modified independent (Device/Increase time)      Transfers Overall transfer level: Needs assistance Equipment used: Rolling walker (2 wheeled) Transfers: Sit to/from Stand Sit to Stand: Supervision         General transfer comment: pt demos good use of UEs and safety with transfers.      Balance Overall balance assessment: Needs assistance         Standing balance support: Single extremity supported Standing balance-Leahy Scale: Poor Standing balance comment: pt needs at least a single UE to maintain balance while standing and bil UE support during dynamic mobility.                     ADL Overall ADL's : Needs assistance/impaired     Grooming: Oral care;Wash/dry hands;Wash/dry face;Sitting;Set up                   Toilet Transfer: Min guard;Ambulation;RW   Toileting- Water quality scientist and Hygiene: Min guard;Sit to/from stand       Functional  mobility during ADLs: Rolling walker;Min guard General ADL Comments: Instructed in breathing techniques and energy conservation strategies and gave pt handout.      Vision                     Perception     Praxis      Cognition   Behavior During Therapy: WFL for tasks assessed/performed Overall Cognitive Status: Within Functional Limits for tasks assessed                       Extremity/Trunk Assessment               Exercises     Shoulder Instructions       General Comments      Pertinent Vitals/ Pain       Pain Assessment: No/denies pain  Home Living                                          Prior Functioning/Environment              Frequency Min 2X/week     Progress Toward Goals  OT Goals(current goals can now be found in the care plan section)  Progress towards OT goals: Progressing toward goals  Acute Rehab OT Goals Patient Stated Goal: Get around without being  SOB.    Plan Discharge plan remains appropriate    Co-evaluation                 End of Session Equipment Utilized During Treatment: Gait belt;Oxygen (6L)   Activity Tolerance Patient limited by fatigue   Patient Left in bed;with call bell/phone within reach;with family/visitor present   Nurse Communication          Time: 7416-3845 OT Time Calculation (min): 31 min  Charges: OT General Charges $OT Visit: 1 Procedure OT Treatments $Self Care/Home Management : 23-37 mins  Malka So 07/09/2014, 4:08 PM  [phone]

## 2014-07-09 NOTE — Progress Notes (Signed)
Physical Therapy Treatment Patient Details Name: Grant Santos MRN: [id number] DOB: 03-26-1946 Today's Date: 07/09/2014    History of Present Illness pt presents post L brachial and ulnar artery thrombectomy with PNA and hx of COPD and L BKA.      PT Comments    Pt able to ambulate twice today 25' each bout with O2 sats decreasing to 71% and 78% respectively while on 6L O2.  Pt needs a minimum of 30mins to recover O2 sats to 90% while still on 6L O2.  Feel as pt's respiratory status improves, his overall mobility will continue to improve.  Will continue to follow to ensure safety prior to D/C.    Follow Up Recommendations  Home health PT;Supervision - Intermittent     Equipment Recommendations  Rolling walker with 5" wheels    Recommendations for Other Services       Precautions / Restrictions Precautions Precautions: Fall Precaution Comments: Watch O2 sats.  pt with L LE prosthetic Restrictions Weight Bearing Restrictions: No    Mobility  Bed Mobility Overal bed mobility: Modified Independent Bed Mobility: Sit to Supine       Sit to supine: Modified independent (Device/Increase time)      Transfers Overall transfer level: Needs assistance Equipment used: Rolling walker (2 wheeled) Transfers: Sit to/from Stand Sit to Stand: Supervision         General transfer comment: pt demos good use of UEs and safety with transfers.    Ambulation/Gait Ambulation/Gait assistance: Min guard Ambulation Distance (Feet): 25 Feet (x2) Assistive device: Rolling walker (2 wheeled) Gait Pattern/deviations: Step-through pattern;Decreased stride length;Trunk flexed     General Gait Details: pt begins to lean more heavily on RW as he becomes fatigued.  pt's O2 sats after ambulating were 71% and 78% on 6L O2 each bout of gait.  pt needs at least 66mins to recover Os sats to 90% while still on 6L O2.     Stairs            Wheelchair Mobility    Modified Rankin (Stroke  Patients Only)       Balance Overall balance assessment: Needs assistance         Standing balance support: Single extremity supported Standing balance-Leahy Scale: Poor Standing balance comment: pt needs at least a single UE to maintain balance while standing and bil UE support during dynamic mobility.                      Cognition Arousal/Alertness: Awake/alert Behavior During Therapy: WFL for tasks assessed/performed Overall Cognitive Status: Within Functional Limits for tasks assessed                      Exercises      General Comments        Pertinent Vitals/Pain Pain Assessment: No/denies pain    Home Living                      Prior Function            PT Goals (current goals can now be found in the care plan section) Acute Rehab PT Goals Patient Stated Goal: Get around without being SOB.   PT Goal Formulation: With patient Time For Goal Achievement: 07/21/14 Potential to Achieve Goals: Good Progress towards PT goals: Progressing toward goals    Frequency  Min 3X/week    PT Plan Equipment recommendations need to be updated  Co-evaluation             End of Session Equipment Utilized During Treatment: Gait belt;Oxygen Activity Tolerance: Treatment limited secondary to medical complications (Comment) (Limited by low O2 sats) Patient left: in bed;with call bell/phone within reach;with family/visitor present     Time: 0034-9179 PT Time Calculation (min) (ACUTE ONLY): 35 min  Charges:  $Gait Training: 23-37 mins                    G CodesCatarina Santos, Grant Santos 07/09/2014, 2:15 PM

## 2014-07-09 NOTE — Progress Notes (Signed)
[facility] PROGRESS NOTE  Grant Santos YKD:[id number] DOB: 22-Nov-1945 DOA: 07/03/2014 PCP: Nicholos Johns, MD  Interim summary Patient is a 69 year old with a past medical history of chronic obstructive pulmonary disease, chronic pain syndrome, narcotic dependent, presented as a transfer from [facility] where he initially was admitted on 07/02/2014 with complaints of shortness of breath, productive cough found to be hypoxemic respiratory failure. He was initially worked up with a CT scan of lungs but did not show evidence of pulmonary embolism. Their were findings on CT to suggest pneumonia. This hospitalization was complicated by the development of brachial artery occlusion or which she was transferred to [facility] to be evaluated by vascular surgery. He was taken to the OR on 07/03/2014 undergoing left brachial and ulnar artery thromboembolectomy. Postoperatively he was started on anticoagulation with IV heparin. Cardiology was consulted who recommended transitioning to Eliquis. With regard to his respiratory status, patient likely having COPD exacerbation precipitated by community acquired pneumonia. He was treated with IV steroids, nebulizers, empiric IV antimicrobial therapy with ceftriaxone and azithromycin. Pulmonary critical care medicine was consulted given lack of improvement from pneumonia/COPD. His antibiotics were broadened. Plan to repeat chest x-ray in a.m. Patient is slowly improving we'll monitor over the weekend to see if oxygen requirements improved. Patient was certainly need home oxygen. We'll transfer to telemetry floor.  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 Bridgett Larsson 07/03/2014. Patient had CT angiogram performed the [facility] which reportedly did not show any evidence of PE.  Repeat CT of the lungs with IV contrast performed  07/04/2014 with stable severe bilateral pulmonary disease more extensive throughout right lung. Severe fibrotic lung disease noted. No evidence of PE. Specifically there is no thrombus identified in the thoracic aorta or visualized in the great vessels.  Patient with good perfusion in the left upper extremity.  Patient has been started on eliquis per cardiology recommendations. Outpatient follow-up with vascular surgery.  #2 acute hypoxic respiratory failure Likely multifactorial secondary to pulmonary fibrosis in the setting of COPD exacerbation and pneumonia. Patient had chest x-ray done 07/04/2014 which did not show any changes to prior chest x-ray. Repeat chest x-ray done 07/07/2014 with similar distribution and appearance of mixed airspace and interstitial disease of the right greater than left lungs. Aeration of the superior left lung is maintained. Chest x-ray done today 07/09/2014 with underlying emphysema and superimposed asymmetric S since slightly improved since prior chest x-ray.  Repeat CT performed 07/04/2014 showed severe bilateral pulmonary disease more extensive throughout the right with severe fibrotic lung changes.  Patient now on 6L [place] however difficult to wean with O2 sats dropping in the low 80s. Will order home O2. Continue steroid taper, antibiotics, nebulizer treatment as outlined by critical care. Outpatient follow-up has been scheduled by critical care.   #3 community acquired pneumonia Repeat chest x-ray shows per the bilateral airspace opacities. I IV Zosyn has been changed total oral Levaquin to complete a course of antibiotic therapy. Repeat chest x-ray with some improvement. Follow.  #4 acute COPD exacerbation No wheezing noted on examination. Continue current dose of oral steroids taper, scheduled nebulizers, Pulmicort, oral levaquin as outlined by PCCM.  Pulmonary critical care medicine following and appreciate input and recommendations.  #5 chronic pain syndrome  narcotic dependent Stable. Continue current regimen.  #6 atrial fibrillation Patient with heart rates in the 39-135 yesterday. Patient Cardizem dose adjusted and changed  to 180 mg twice a day per cardiology. Continue digoxin. Continue eliquis for anticoagulation. Cardiology following.  #7 dysuria UA negative. Improved.   #8 prophylaxis PPI for GI prophylaxis. On Eliquis for anticoagulation.  Code Status: Full Family Communication: Updated patient no family at bedside. Disposition Plan: transfer   Consultants:  Vascular surgery: Dr. Bridgett Larsson 07/03/2014  PCCM Dr. Nelda Marseille 07/05/2014  Cardiology: Dr. Caryl Comes 07/05/2014  Procedures:  Left brachial and ulnar artery thrombectomy per Dr. Bridgett Larsson Vascular surgery 07/03/2014  CT chest 07/04/2014  2-D echo 07/04/2014  Chest x-ray 07/04/2014, 07/07/2014, 07/09/2014  Antibiotics:  IV Rocephin 07/03/2014>>> 07/05/2014  IV azithromycin 07/03/2014>>>> 07/05/2014  IV vancomycin 07/05/2014 >>>>>07/07/14  IV Zosyn 07/05/2014>>>>>>07/08/14  Oral Levaquin 07/09/2014  HPI/Subjective: Patient states some improvement with breathing.  Patient on 6L [place] now and difficult to titrate down. Had to be on venti mask last night. HR improving and ranging from 39- 135.  Objective: Filed Vitals:   07/09/14 0757  BP: 114/68  Pulse: 39  Temp:   Resp: 13    Intake/Output Summary (Last 24 hours) at 07/09/14 0849 Last data filed at 07/09/14 0500  Gross per 24 hour  Intake    410 ml  Output   1850 ml  Net  -1440 ml   Filed Weights   07/03/14 1357  Weight: 95.9 kg (211 lb 6.7 oz)    Exam:   General:  NAD  Cardiovascular:  irregularly irregular  Respiratory: Fine crackles noted right greater than left with some improvement. No wheezing. No rhonchi.  Abdomen: Soft, nontender, nondistended, positive bowel sounds.  Musculoskeletal: No clubbing or cyanosis. Status post left BKA. Left upper extremity warm, good perfusion.  Data  Reviewed: Basic Metabolic Panel:  Recent Labs Lab 07/04/14 0540 07/04/14 2355 07/06/14 0315 07/08/14 0455 07/09/14 0247  NA 139 139 139 139 138  K 4.0 3.6 3.7 3.2* 4.1  CL 100 98 99 95* 96  CO2 27 33* 30 32 36*  GLUCOSE 153* 168* 181* 165* 176*  BUN 20 20 22  25* 24*  CREATININE 0.70 0.68 0.84 0.99 1.01  CALCIUM 8.9 8.7 8.5 8.8 8.7   Liver Function Tests:  Recent Labs Lab 07/03/14 1556  AST 45*  ALT 30  ALKPHOS 135*  BILITOT 0.9  PROT 6.3  ALBUMIN 2.7*   No results for input(s): LIPASE, AMYLASE in the last 168 hours. No results for input(s): AMMONIA in the last 168 hours. CBC:  Recent Labs Lab 07/03/14 1556  07/04/14 2355 07/06/14 0315 07/07/14 0355 07/08/14 0455 07/09/14 0247  WBC 18.3*  < > 17.3* 16.7* 16.8* 18.5* 21.1*  NEUTROABS 16.3*  --   --   --   --  16.4*  --   HGB 11.9*  < > 10.4* 10.4* 11.4* 12.4* 11.6*  HCT 36.3*  < > 31.8* 32.2* 35.5* 38.5* 36.3*  MCV 85.8  < > 85.5 86.1 87.2 88.1 87.3  PLT 265  < > 240 245 277 316 290  < > = values in this interval not displayed. Cardiac Enzymes: No results for input(s): CKTOTAL, CKMB, CKMBINDEX, TROPONINI in the last 168 hours. BNP (last 3 results) No results for input(s): PROBNP in the last 8760 hours. CBG: No results for input(s): GLUCAP in the last 168 hours.  Recent Results (from the past 240 hour(s))  MRSA PCR Screening     Status: None   Collection Time: 07/03/14  2:04 PM  Result Value Ref Range Status   MRSA by PCR NEGATIVE NEGATIVE Final  Comment:        The GeneXpert MRSA Assay (FDA approved for NASAL specimens only), is one component of a comprehensive MRSA colonization surveillance program. It is not intended to diagnose MRSA infection nor to guide or monitor treatment for MRSA infections.      Studies: Dg Chest 2 View  07/09/2014   CLINICAL DATA:  Followup pulmonary infiltrates.  EXAM: CHEST  2 VIEW  COMPARISON:  07/07/2014  FINDINGS: The cardiac silhouette, mediastinal and hilar  contours are stable. Stable tortuosity and ectasia of the thoracic aorta. Stable underlying emphysematous changes with superimposed airspace process, slightly improved. No pleural effusion or pneumothorax.  IMPRESSION: Underlying emphysema and superimposed asymmetric airspace process slightly improved since prior chest x-ray.   Electronically Signed   By: Kalman Jewels M.D.   On: 07/09/2014 07:44    Scheduled Meds: . apixaban  10 mg Oral BID  . atorvastatin  40 mg Oral QHS  . bethanechol  25 mg Oral TID  . budesonide  0.25 mg Nebulization BID  . digoxin  125 mcg Oral Daily  . diltiazem  180 mg Oral Q12H  . docusate sodium  100 mg Oral Daily  . furosemide  40 mg Oral Daily  . gabapentin  600 mg Oral BID  . ipratropium  0.5 mg Nebulization Q6H  . levalbuterol  0.63 mg Nebulization Q6H  . levofloxacin  500 mg Oral Daily  . pantoprazole  40 mg Oral Daily  . potassium chloride SA  20 mEq Oral Daily  . predniSONE  60 mg Oral Q breakfast  . sodium chloride  3 mL Intravenous Q12H   Continuous Infusions:   Principal Problem:   Brachial artery occlusion, left Active Problems:   Acute respiratory failure   COPD (chronic obstructive pulmonary disease) with emphysema   CAP (community acquired pneumonia)   A-fib   Protein-calorie malnutrition, severe   Atrial fibrillation, unspecified   Current use of long term anticoagulation   Acute brachial vein embolism   Persistent atrial fibrillation   PNA (pneumonia)   Pulmonary infiltrates    Time spent: 2 mins    [facility] MD [facility] Pager [phone]. If 7PM-7AM, please contact night-coverage at [email or web address], password [facility] 07/09/2014, 8:49 AM  LOS: 6 days

## 2014-07-10 LAB — BASIC METABOLIC PANEL
ANION GAP: 7 (ref 5–15)
BUN: 23 mg/dL (ref 6–23)
CO2: 32 mmol/L (ref 19–32)
CREATININE: 0.8 mg/dL (ref 0.50–1.35)
Calcium: 8.5 mg/dL (ref 8.4–10.5)
Chloride: 96 mmol/L (ref 96–112)
GLUCOSE: 139 mg/dL — AB (ref 70–99)
Potassium: 4.1 mmol/L (ref 3.5–5.1)
Sodium: 135 mmol/L (ref 135–145)

## 2014-07-10 LAB — CBC
HEMATOCRIT: 37.2 % — AB (ref 39.0–52.0)
Hemoglobin: 11.9 g/dL — ABNORMAL LOW (ref 13.0–17.0)
MCH: 27.9 pg (ref 26.0–34.0)
MCHC: 32 g/dL (ref 30.0–36.0)
MCV: 87.1 fL (ref 78.0–100.0)
PLATELETS: 326 10*3/uL (ref 150–400)
RBC: 4.27 MIL/uL (ref 4.22–5.81)
RDW: 14.7 % (ref 11.5–15.5)
WBC: 23.8 10*3/uL — AB (ref 4.0–10.5)

## 2014-07-10 LAB — PROCALCITONIN

## 2014-07-10 LAB — MAGNESIUM: MAGNESIUM: 2.5 mg/dL (ref 1.5–2.5)

## 2014-07-10 MED ORDER — GI COCKTAIL ~~LOC~~: 30.0000 mL | Freq: Three times a day (TID) | ORAL | Status: DC | PRN

## 2014-07-10 NOTE — Progress Notes (Signed)
Patient ID: Grant Santos, male   DOB: [birth year], 69 y.o.   MRN: [id number]     Subjective:    Still with some SOB this AM  Objective:   Temp:  [97.6 F (36.4 C)-98 F (36.7 C)] 97.6 F (36.4 C) (01/30 0517) Pulse Rate:  [64-84] 84 (01/30 1008) Resp:  [17-18] 18 (01/30 0517) BP: (84-120)/(50-70) 116/70 mmHg (01/30 1045) SpO2:  [88 %-92 %] 90 % (01/30 1008) Last BM Date: 07/09/14  Filed Weights   07/03/14 1357  Weight: 211 lb 6.7 oz (95.9 kg)    Intake/Output Summary (Last 24 hours) at 07/10/14 1118 Last data filed at 07/10/14 0730  Gross per 24 hour  Intake   1440 ml  Output   1225 ml  Net    215 ml    Telemetry: currently afib 70s. Some low rates 40s late night hours  Exam:  General: NAD  Resp: CTAB  Cardiac: irreg, no m/r/g, no JVD  KD:XIPJASN soft, NT, ND  MSK:no edema  Neuro: no focal deficits   Lab Results:  Basic Metabolic Panel:  Recent Labs Lab 07/08/14 0455 07/09/14 0247 07/10/14 0610  NA 139 138 135  K 3.2* 4.1 4.1  CL 95* 96 96  CO2 32 36* 32  GLUCOSE 165* 176* 139*  BUN 25* 24* 23  CREATININE 0.99 1.01 0.80  CALCIUM 8.8 8.7 8.5  MG  --   --  2.5    Liver Function Tests:  Recent Labs Lab 07/03/14 1556  AST 45*  ALT 30  ALKPHOS 135*  BILITOT 0.9  PROT 6.3  ALBUMIN 2.7*    CBC:  Recent Labs Lab 07/08/14 0455 07/09/14 0247 07/10/14 0610  WBC 18.5* 21.1* 23.8*  HGB 12.4* 11.6* 11.9*  HCT 38.5* 36.3* 37.2*  MCV 88.1 87.3 87.1  PLT 316 290 326    Cardiac Enzymes: No results for input(s): CKTOTAL, CKMB, CKMBINDEX, TROPONINI in the last 168 hours.  BNP: No results for input(s): PROBNP in the last 8760 hours.  Coagulation:  Recent Labs Lab 07/03/14 1556 07/03/14 1948 07/04/14 0540  INR 2.72* 2.72* 1.50*    ECG:   Medications:   Scheduled Medications: . apixaban  10 mg Oral BID  . atorvastatin  40 mg Oral QHS  . bethanechol  25 mg Oral TID  . budesonide  0.25 mg Nebulization BID  . digoxin  125  mcg Oral Daily  . diltiazem  180 mg Oral Q12H  . docusate sodium  100 mg Oral Daily  . furosemide  40 mg Oral Daily  . gabapentin  600 mg Oral BID  . ipratropium  0.5 mg Nebulization Q6H  . levalbuterol  0.63 mg Nebulization Q6H  . levofloxacin  500 mg Oral Daily  . pantoprazole  40 mg Oral Daily  . potassium chloride SA  20 mEq Oral Daily  . predniSONE  60 mg Oral Q breakfast  . sodium chloride  3 mL Intravenous Q12H     Infusions:     PRN Medications:  sodium chloride, acetaminophen **OR** acetaminophen, alum & mag hydroxide-simeth, guaiFENesin-dextromethorphan, HYDROmorphone (DILAUDID) injection, LORazepam, ondansetron **OR** ondansetron (ZOFRAN) IV, oxyCODONE, phenol, sodium chloride      Assessment/Plan   69 y/o male with history of Afib on Coumadin admitted for acute left upper extremity ischemia in the setting of thrombus in brachial artery extending in to the ulnar artery. INR on arrival was actually supratherapeutic at 3.3. Coumadin was reversed with vitamin K to allow for urgent vascular surgery.  S/p left brachial and ulnar artery thromboembolectomy  1. Acute ischemic LUE ischemia - s/p thromboembolectomy by vascular - INR at time of event was 3.3, coumadin stopped and started on eliquis. Dosing per previous cardiology note of 10mg  bid for 7 days then 5mg  bid.  2. Afib - on dilt and dig for rate control - low rates at night, patient reports hx of OSA. Will write for nighttime CPAP  3. SOB - continued SOB and O2 requirement, CXR show emphysema and asymmetric airspace process. No clear edema, no significant volume overload by exam. Does not appear to be cardiac.    4. OSA - recommend CPAP at night, he is on at home.       Carlyle Dolly, M.D

## 2014-07-10 NOTE — Progress Notes (Signed)
[facility] PROGRESS NOTE  JOVAUGHN WOJTASZEK ATF:[id number] DOB: 12-Apr-1946 DOA: 07/03/2014 PCP: Nicholos Johns, MD  Interim summary Patient is a 69 year old with a past medical history of chronic obstructive pulmonary disease, chronic pain syndrome, narcotic dependent, presented as a transfer from [facility] where he initially was admitted on 07/02/2014 with complaints of shortness of breath, productive cough found to be hypoxemic respiratory failure. He was initially worked up with a CT scan of lungs but did not show evidence of pulmonary embolism. Their were findings on CT to suggest pneumonia. This hospitalization was complicated by the development of brachial artery occlusion or which she was transferred to [facility] to be evaluated by vascular surgery. He was taken to the OR on 07/03/2014 undergoing left brachial and ulnar artery thromboembolectomy. Postoperatively he was started on anticoagulation with IV heparin. Cardiology was consulted who recommended transitioning to Eliquis. With regard to his respiratory status, patient likely having COPD exacerbation precipitated by community acquired pneumonia. He was treated with IV steroids, nebulizers, empiric IV antimicrobial therapy with ceftriaxone and azithromycin. Pulmonary critical care medicine was consulted given lack of improvement from pneumonia/COPD. His antibiotics were broadened. Plan to repeat chest x-ray in a.m. Patient is slowly improving we'll monitor over the weekend to see if oxygen requirements improved. Patient was certainly need home oxygen. We'll transfer to telemetry floor.  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 Bridgett Larsson 07/03/2014. Patient had CT angiogram performed the [facility] which reportedly did not show any evidence of PE.  Repeat CT of the lungs with IV contrast performed  07/04/2014 with stable severe bilateral pulmonary disease more extensive throughout right lung. Severe fibrotic lung disease noted. No evidence of PE. Specifically there is no thrombus identified in the thoracic aorta or visualized in the great vessels.  Patient with good perfusion in the left upper extremity.  Patient has been started on eliquis per cardiology recommendations. Outpatient follow-up with vascular surgery.  #2 acute hypoxic respiratory failure Likely multifactorial secondary to pulmonary fibrosis in the setting of COPD exacerbation and pneumonia. Patient had chest x-ray done 07/04/2014 which did not show any changes to prior chest x-ray. Repeat chest x-ray done 07/07/2014 with similar distribution and appearance of mixed airspace and interstitial disease of the right greater than left lungs. Aeration of the superior left lung is maintained. Chest x-ray done today 07/09/2014 with underlying emphysema and superimposed asymmetric S since slightly improved since prior chest x-ray.  Repeat CT performed 07/04/2014 showed severe bilateral pulmonary disease more extensive throughout the right with severe fibrotic lung changes.  Patient now on 6L [place] however difficult to wean with O2 sats dropping in the low 70s with ambulation. Ordered home O2. Continue steroid taper, antibiotics, nebulizer treatment as outlined by critical care. Outpatient follow-up has been scheduled by critical care.   #3 community acquired pneumonia Repeat chest x-ray shows per the bilateral airspace opacities. I IV Zosyn has been changed total oral Levaquin to complete a course of antibiotic therapy. Repeat chest x-ray with some improvement. Follow.  #4 acute COPD exacerbation No wheezing noted on examination. Continue current dose of oral steroids taper, scheduled nebulizers, Pulmicort, oral levaquin as outlined by PCCM.  Pulmonary critical care medicine following and appreciate input and recommendations.  #5 chronic pain  syndrome narcotic dependent Stable. Continue current regimen.  #6 atrial fibrillation Patient with heart rates in the 39-135 yesterday. Patient Cardizem dose adjusted and  changed to 180 mg twice a day per cardiology. Continue digoxin. Continue eliquis for anticoagulation. Cardiology following.  #7 dysuria UA negative. Resolved.   #8 prophylaxis PPI for GI prophylaxis. On Eliquis for anticoagulation.  Code Status: Full Family Communication: Updated patient no family at bedside. Disposition Plan: Remain inpatient until some improvement with oxygenation status on nasal cannula.   Consultants:  Vascular surgery: Dr. Bridgett Larsson 07/03/2014  PCCM Dr. Nelda Marseille 07/05/2014  Cardiology: Dr. Caryl Comes 07/05/2014  Procedures:  Left brachial and ulnar artery thrombectomy per Dr. Bridgett Larsson Vascular surgery 07/03/2014  CT chest 07/04/2014  2-D echo 07/04/2014  Chest x-ray 07/04/2014, 07/07/2014, 07/09/2014  Antibiotics:  IV Rocephin 07/03/2014>>> 07/05/2014  IV azithromycin 07/03/2014>>>> 07/05/2014  IV vancomycin 07/05/2014 >>>>>07/07/14  IV Zosyn 07/05/2014>>>>>>07/08/14  Oral Levaquin 07/09/2014 X 5 DAYS  HPI/Subjective: Patient states improvement with breathing.  Patient on 6L [place] now and difficult to titrate down. Patient noted to have 89-90% on 6 L at rest which drops to 70% sats with activity on 6 L nasal cannula..  Objective: Filed Vitals:   07/10/14 1045  BP: 116/70  Pulse:   Temp:   Resp:     Intake/Output Summary (Last 24 hours) at 07/10/14 1109 Last data filed at 07/10/14 0518  Gross per 24 hour  Intake   1080 ml  Output    825 ml  Net    255 ml   Filed Weights   07/03/14 1357  Weight: 95.9 kg (211 lb 6.7 oz)    Exam:   General:  NAD  Cardiovascular:  irregularly irregular  Respiratory: Fine crackles noted right greater than left with some improvement. No wheezing. No rhonchi.  Abdomen: Soft, nontender, nondistended, positive bowel sounds.  Musculoskeletal:  No clubbing or cyanosis. Status post left BKA. Left upper extremity warm, good perfusion.  Data Reviewed: Basic Metabolic Panel:  Recent Labs Lab 07/04/14 2355 07/06/14 0315 07/08/14 0455 07/09/14 0247 07/10/14 0610  NA 139 139 139 138 135  K 3.6 3.7 3.2* 4.1 4.1  CL 98 99 95* 96 96  CO2 33* 30 32 36* 32  GLUCOSE 168* 181* 165* 176* 139*  BUN 20 22 25* 24* 23  CREATININE 0.68 0.84 0.99 1.01 0.80  CALCIUM 8.7 8.5 8.8 8.7 8.5  MG  --   --   --   --  2.5   Liver Function Tests:  Recent Labs Lab 07/03/14 1556  AST 45*  ALT 30  ALKPHOS 135*  BILITOT 0.9  PROT 6.3  ALBUMIN 2.7*   No results for input(s): LIPASE, AMYLASE in the last 168 hours. No results for input(s): AMMONIA in the last 168 hours. CBC:  Recent Labs Lab 07/03/14 1556  07/06/14 0315 07/07/14 0355 07/08/14 0455 07/09/14 0247 07/10/14 0610  WBC 18.3*  < > 16.7* 16.8* 18.5* 21.1* 23.8*  NEUTROABS 16.3*  --   --   --  16.4*  --   --   HGB 11.9*  < > 10.4* 11.4* 12.4* 11.6* 11.9*  HCT 36.3*  < > 32.2* 35.5* 38.5* 36.3* 37.2*  MCV 85.8  < > 86.1 87.2 88.1 87.3 87.1  PLT 265  < > 245 277 316 290 326  < > = values in this interval not displayed. Cardiac Enzymes: No results for input(s): CKTOTAL, CKMB, CKMBINDEX, TROPONINI in the last 168 hours. BNP (last 3 results) No results for input(s): PROBNP in the last 8760 hours. CBG: No results for input(s): GLUCAP in the last 168 hours.  Recent Results (from the past  240 hour(s))  MRSA PCR Screening     Status: None   Collection Time: 07/03/14  2:04 PM  Result Value Ref Range Status   MRSA by PCR NEGATIVE NEGATIVE Final    Comment:        The GeneXpert MRSA Assay (FDA approved for NASAL specimens only), is one component of a comprehensive MRSA colonization surveillance program. It is not intended to diagnose MRSA infection nor to guide or monitor treatment for MRSA infections.      Studies: Dg Chest 2 View  07/09/2014   CLINICAL DATA:  Followup  pulmonary infiltrates.  EXAM: CHEST  2 VIEW  COMPARISON:  07/07/2014  FINDINGS: The cardiac silhouette, mediastinal and hilar contours are stable. Stable tortuosity and ectasia of the thoracic aorta. Stable underlying emphysematous changes with superimposed airspace process, slightly improved. No pleural effusion or pneumothorax.  IMPRESSION: Underlying emphysema and superimposed asymmetric airspace process slightly improved since prior chest x-ray.   Electronically Signed   By: Kalman Jewels M.D.   On: 07/09/2014 07:44    Scheduled Meds: . apixaban  10 mg Oral BID  . atorvastatin  40 mg Oral QHS  . bethanechol  25 mg Oral TID  . budesonide  0.25 mg Nebulization BID  . digoxin  125 mcg Oral Daily  . diltiazem  180 mg Oral Q12H  . docusate sodium  100 mg Oral Daily  . furosemide  40 mg Oral Daily  . gabapentin  600 mg Oral BID  . ipratropium  0.5 mg Nebulization Q6H  . levalbuterol  0.63 mg Nebulization Q6H  . levofloxacin  500 mg Oral Daily  . pantoprazole  40 mg Oral Daily  . potassium chloride SA  20 mEq Oral Daily  . predniSONE  60 mg Oral Q breakfast  . sodium chloride  3 mL Intravenous Q12H   Continuous Infusions:   Principal Problem:   Brachial artery occlusion, left Active Problems:   Acute respiratory failure   COPD (chronic obstructive pulmonary disease) with emphysema   CAP (community acquired pneumonia)   A-fib   Protein-calorie malnutrition, severe   Atrial fibrillation, unspecified   Current use of long term anticoagulation   Acute brachial vein embolism   Persistent atrial fibrillation   PNA (pneumonia)   Pulmonary infiltrates   COPD (chronic obstructive pulmonary disease)    Time spent: 70 mins    [facility] MD [facility] Pager [phone]. If 7PM-7AM, please contact night-coverage at [email or web address], password [facility] 07/10/2014, 11:09 AM  LOS: 7 days

## 2014-07-10 NOTE — Progress Notes (Signed)
Physical Therapy Treatment Patient Details Name: Grant Santos MRN: [id number] DOB: Aug 01, 1945 Today's Date: 08/06/14    History of Present Illness pt presents post L brachial and ulnar artery thrombectomy with PNA and hx of COPD and L BKA.      PT Comments    Pt expressing frustration over lack of respiratory and activity progression. Pt continues to maintain 89-90% on 6L at rest with drops into the 70's with activity on 6L. Pt motivated and encouraged to continue multiple small bouts of exercise and activity to increase endurance. Will continue to follow.   Follow Up Recommendations  Home health PT;Supervision - Intermittent     Equipment Recommendations  Rolling walker with 5" wheels    Recommendations for Other Services       Precautions / Restrictions Precautions Precautions: Fall Precaution Comments: Watch O2 sats.  pt with L LE prosthetic Restrictions Weight Bearing Restrictions: No    Mobility  Bed Mobility               General bed mobility comments: EOB on arrival  Transfers Overall transfer level: Modified independent                  Ambulation/Gait Ambulation/Gait assistance: Supervision Ambulation Distance (Feet): 30 Feet (x 2 trials with 6 min seated rest for recovery between) Assistive device: Rolling walker (2 wheeled) Gait Pattern/deviations: Step-through pattern;Decreased stride length;Trunk flexed   Gait velocity interpretation: Below normal speed for age/gender General Gait Details: tripod breathing, significant focus on breathing technique with pt with periods of SOB at 98' with sats dropping to 74% on 6L   Stairs            Wheelchair Mobility    Modified Rankin (Stroke Patients Only)       Balance Overall balance assessment: Needs assistance   Sitting balance-Leahy Scale: Normal       Standing balance-Leahy Scale: Poor                      Cognition Arousal/Alertness: Awake/alert Behavior  During Therapy: WFL for tasks assessed/performed Overall Cognitive Status: Within Functional Limits for tasks assessed                      Exercises      General Comments        Pertinent Vitals/Pain Pain Assessment: No/denies pain    Home Living                      Prior Function            PT Goals (current goals can now be found in the care plan section) Progress towards PT goals: Progressing toward goals (slowly)    Frequency       PT Plan Current plan remains appropriate    Co-evaluation             End of Session Equipment Utilized During Treatment: Oxygen Activity Tolerance: Patient limited by fatigue Patient left: in chair;with call bell/phone within reach     Time: 0937-1004 PT Time Calculation (min) (ACUTE ONLY): 27 min  Charges:  $Gait Training: 8-22 mins $Therapeutic Activity: 8-22 mins                    G Codes:      Melford Aase 06-Aug-2014, 10:11 AM Elwyn Reach, [place]

## 2014-07-11 DIAGNOSIS — J9600 Acute respiratory failure, unspecified whether with hypoxia or hypercapnia: Secondary | ICD-10-CM

## 2014-07-11 LAB — CBC
HCT: 36.9 % — ABNORMAL LOW (ref 39.0–52.0)
HEMOGLOBIN: 11.9 g/dL — AB (ref 13.0–17.0)
MCH: 28.2 pg (ref 26.0–34.0)
MCHC: 32.2 g/dL (ref 30.0–36.0)
MCV: 87.4 fL (ref 78.0–100.0)
PLATELETS: 321 10*3/uL (ref 150–400)
RBC: 4.22 MIL/uL (ref 4.22–5.81)
RDW: 14.6 % (ref 11.5–15.5)
WBC: 20.1 10*3/uL — ABNORMAL HIGH (ref 4.0–10.5)

## 2014-07-11 LAB — BASIC METABOLIC PANEL
Anion gap: 8 (ref 5–15)
BUN: 23 mg/dL (ref 6–23)
CHLORIDE: 95 mmol/L — AB (ref 96–112)
CO2: 32 mmol/L (ref 19–32)
CREATININE: 0.88 mg/dL (ref 0.50–1.35)
Calcium: 8.7 mg/dL (ref 8.4–10.5)
GFR calc Af Amer: >90 mL/min (ref >90)
GFR calc non Af Amer: 87 mL/min — ABNORMAL LOW (ref >90)
Glucose, Bld: 115 mg/dL — ABNORMAL HIGH (ref 70–99)
POTASSIUM: 4.7 mmol/L (ref 3.5–5.1)
SODIUM: 135 mmol/L (ref 135–145)

## 2014-07-11 MED ORDER — PREDNISONE 20 MG PO TABS
40.0000 mg | ORAL_TABLET | Freq: Every day | ORAL | Status: DC
  Administered 2014-07-12: 40 mg via ORAL
  Filled 2014-07-11 (×2): qty 2

## 2014-07-11 NOTE — Progress Notes (Signed)
Patient ID: Grant Santos, male   DOB: 03-01-1946, 69 y.o.   MRN: [id number]    Subjective:    + SOB  Objective:   Temp:  [97.5 F (36.4 C)-98.3 F (36.8 C)] 98.3 F (36.8 C) (01/31 0614) Pulse Rate:  [76-150] 80 (01/31 0614) Resp:  [18-22] 22 (01/31 0614) BP: (104-120)/(55-70) 120/55 mmHg (01/31 0614) SpO2:  [83 %-95 %] 88 % (01/31 0813) Last BM Date: 07/11/14  Filed Weights   07/03/14 1357  Weight: 211 lb 6.7 oz (95.9 kg)    Intake/Output Summary (Last 24 hours) at 07/11/14 0927 Last data filed at 07/11/14 0730  Gross per 24 hour  Intake   1080 ml  Output   2576 ml  Net  -1496 ml    Telemetry: afib, rates primarily <110. Occasional low rates primarily at night  Exam:  General: NAD  Resp: CTAB  Cardiac: irreg, no m/r/g, no JVD  PJ:ASNKNLZ soft, NT, ND  MSK:no LE edema  Neuro: no focal deficits   Lab Results:  Basic Metabolic Panel:  Recent Labs Lab 07/09/14 0247 07/10/14 0610 07/11/14 0330  NA 138 135 135  K 4.1 4.1 4.7  CL 96 96 95*  CO2 36* 32 32  GLUCOSE 176* 139* 115*  BUN 24* 23 23  CREATININE 1.01 0.80 0.88  CALCIUM 8.7 8.5 8.7  MG  --  2.5  --     Liver Function Tests: No results for input(s): AST, ALT, ALKPHOS, BILITOT, PROT, ALBUMIN in the last 168 hours.  CBC:  Recent Labs Lab 07/09/14 0247 07/10/14 0610 07/11/14 0330  WBC 21.1* 23.8* 20.1*  HGB 11.6* 11.9* 11.9*  HCT 36.3* 37.2* 36.9*  MCV 87.3 87.1 87.4  PLT 290 326 321    Cardiac Enzymes: No results for input(s): CKTOTAL, CKMB, CKMBINDEX, TROPONINI in the last 168 hours.  BNP: No results for input(s): PROBNP in the last 8760 hours.  Coagulation: No results for input(s): INR in the last 168 hours.  ECG:   Medications:   Scheduled Medications: . apixaban  10 mg Oral BID  . atorvastatin  40 mg Oral QHS  . bethanechol  25 mg Oral TID  . budesonide  0.25 mg Nebulization BID  . digoxin  125 mcg Oral Daily  . diltiazem  180 mg Oral Q12H  . docusate sodium   100 mg Oral Daily  . furosemide  40 mg Oral Daily  . gabapentin  600 mg Oral BID  . ipratropium  0.5 mg Nebulization Q6H  . levalbuterol  0.63 mg Nebulization Q6H  . levofloxacin  500 mg Oral Daily  . pantoprazole  40 mg Oral Daily  . potassium chloride SA  20 mEq Oral Daily  . predniSONE  60 mg Oral Q breakfast  . sodium chloride  3 mL Intravenous Q12H     Infusions:     PRN Medications:  sodium chloride, acetaminophen **OR** acetaminophen, alum & mag hydroxide-simeth, guaiFENesin-dextromethorphan, HYDROmorphone (DILAUDID) injection, LORazepam, ondansetron **OR** ondansetron (ZOFRAN) IV, oxyCODONE, phenol, sodium chloride     Assessment/Plan   69 y/o male with history of Afib on Coumadin admitted for acute left upper extremity ischemia in the setting of thrombus in brachial artery extending in to the ulnar artery. INR on arrival was actually supratherapeutic at 3.3. Coumadin was reversed with vitamin K to allow for urgent vascular surgery. S/p left brachial and ulnar artery thromboembolectomy  1. Acute ischemic LUE ischemia - s/p thromboembolectomy by vascular - INR at time of event was 3.3,  coumadin stopped and started on eliquis. Dosing per previous cardiology note of 10mg  bid for 7 days then 5mg  bid.  2. Afib - on dilt and dig for rate control - rates overall controlled, occasional low rates primarily at night that are asymptomatic. He just restarted his CPAP last night, will continue to follow.   3. SOB 07/04/14 Echo: LVEF 86-16%, diastolic fxn not described due to afib - continued SOB and O2 requirement, CXR show emphysema and asymmetric airspace process. No clear edema, no significant volume overload by exam. Does not appear to be primarily cardiac, COPD and recent pneumonia managed by primary team. Continue gradual diuresis on oral diuretic. - on lasix 40mg  daily, negative 1.2 liters yesterday and 8 liters since admission. Renal function stable, continue oral diuretic.      4. OSA - recommend CPAP at night, he is on at home.       Carlyle Dolly, M.D.

## 2014-07-11 NOTE — Progress Notes (Signed)
Around 6 NT notified the nurse that patient's O2 was at 83% with 3L [place]. Nurse instructed patient to increase breathing through nose and assisted to the tripod position for easier breathing. Patient demonstrating dyspnea at rest. The nurse then increased O2 to 3.5L and brought sats up to 91%. Patient reported becoming SOB in the transition between CPap and nasal cannula.  Patient instructed and verbalized understanding to call out for any further episodes of SOB.   Will continue to monitor closely.   Earlie Lou

## 2014-07-11 NOTE — Progress Notes (Signed)
Placed patient on CPAP for the night with pressure set at 10cm H20 and oxygen set at 2lpm with Sp02=92%

## 2014-07-11 NOTE — Plan of Care (Signed)
Problem: Phase I Progression Outcomes Goal: Dyspnea controlled at rest Outcome: Not Progressing Patient experiences dyspnea when transitioning from CPap to O2 [place]

## 2014-07-11 NOTE — Progress Notes (Signed)
[facility] PROGRESS NOTE  Grant Santos YBO:[id number] DOB: [birth year] DOA: 07/03/2014 PCP: Nicholos Johns, MD  Interim summary Patient is a 69 year old with a past medical history of chronic obstructive pulmonary disease, chronic pain syndrome, narcotic dependent, presented as a transfer from [facility] where he initially was admitted on 07/02/2014 with complaints of shortness of breath, productive cough found to be hypoxemic respiratory failure. He was initially worked up with a CT scan of lungs but did not show evidence of pulmonary embolism. Their were findings on CT to suggest pneumonia. This hospitalization was complicated by the development of brachial artery occlusion or which she was transferred to [facility] to be evaluated by vascular surgery. He was taken to the OR on 07/03/2014 undergoing left brachial and ulnar artery thromboembolectomy. Postoperatively he was started on anticoagulation with IV heparin. Cardiology was consulted who recommended transitioning to Eliquis. With regard to his respiratory status, patient likely having COPD exacerbation precipitated by community acquired pneumonia. He was treated with IV steroids, nebulizers, empiric IV antimicrobial therapy with ceftriaxone and azithromycin. Pulmonary critical care medicine was consulted given lack of improvement from pneumonia/COPD. His antibiotics were broadened. Plan to repeat chest x-ray in a.m. Patient is slowly improving we'll monitor over the weekend to see if oxygen requirements improved. Patient was certainly need home oxygen. We'll transfer to telemetry floor.  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 Bridgett Larsson 07/03/2014. Patient had CT angiogram performed the [facility] which reportedly did not show any evidence of PE.  Repeat CT of the lungs with IV contrast performed  07/04/2014 with stable severe bilateral pulmonary disease more extensive throughout right lung. Severe fibrotic lung disease noted. No evidence of PE. Specifically there is no thrombus identified in the thoracic aorta or visualized in the great vessels.  Patient with good perfusion in the left upper extremity.  Patient has been started on eliquis per cardiology recommendations. Outpatient follow-up with vascular surgery.  #2 acute hypoxic respiratory failure Likely multifactorial secondary to pulmonary fibrosis in the setting of COPD exacerbation and pneumonia. Patient had chest x-ray done 07/04/2014 which did not show any changes to prior chest x-ray. Repeat chest x-ray done 07/07/2014 with similar distribution and appearance of mixed airspace and interstitial disease of the right greater than left lungs. Aeration of the superior left lung is maintained. Chest x-ray done today 07/09/2014 with underlying emphysema and superimposed asymmetric S since slightly improved since prior chest x-ray.  Repeat CT performed 07/04/2014 showed severe bilateral pulmonary disease more extensive throughout the right with severe fibrotic lung changes.  Patient now on 3-4L [place] with sats 83-95. Ordered home O2. Continue steroid taper, antibiotics, nebulizer treatment as outlined by critical care. Outpatient follow-up has been scheduled by critical care.   #3 community acquired pneumonia Repeat chest x-ray shows per the bilateral airspace opacities. I IV Zosyn has been changed total oral Levaquin to complete a course of antibiotic therapy. Repeat chest x-ray with some improvement. Follow.  #4 acute COPD exacerbation No wheezing noted on examination. Continue current dose of oral steroids taper, scheduled nebulizers, Pulmicort, oral levaquin as outlined by PCCM.  Pulmonary critical care medicine following and appreciate input and recommendations.  #5 chronic pain syndrome narcotic dependent Stable. Continue current  regimen.  #6 atrial fibrillation Patient with heart rates in the 76-150 yesterday. Patient Cardizem dose adjusted and changed to 180 mg twice a day per cardiology. Continue digoxin.  Continue eliquis for anticoagulation. Cardiology following.  #7 dysuria UA negative. Resolved.   #8 prophylaxis PPI for GI prophylaxis. On Eliquis for anticoagulation.  Code Status: Full Family Communication: Updated patient no family at bedside. Disposition Plan: Remain inpatient until some improvement with oxygenation status on nasal cannula.   Consultants:  Vascular surgery: Dr. Bridgett Larsson 07/03/2014  PCCM Dr. Nelda Marseille 07/05/2014  Cardiology: Dr. Caryl Comes 07/05/2014  Procedures:  Left brachial and ulnar artery thrombectomy per Dr. Bridgett Larsson Vascular surgery 07/03/2014  CT chest 07/04/2014  2-D echo 07/04/2014  Chest x-ray 07/04/2014, 07/07/2014, 07/09/2014  Antibiotics:  IV Rocephin 07/03/2014>>> 07/05/2014  IV azithromycin 07/03/2014>>>> 07/05/2014  IV vancomycin 07/05/2014 >>>>>07/07/14  IV Zosyn 07/05/2014>>>>>>07/08/14  Oral Levaquin 07/09/2014 X 5 DAYS  HPI/Subjective: Patient states improvement with breathing.  Patient on 3-4L [place] now. Patient noted to have 83-95% on 3-4 L. patient denies any chest pain.  Objective: Filed Vitals:   07/11/14 0614  BP: 120/55  Pulse: 80  Temp: 98.3 F (36.8 C)  Resp: 22    Intake/Output Summary (Last 24 hours) at 07/11/14 1112 Last data filed at 07/11/14 0730  Gross per 24 hour  Intake   1080 ml  Output   2576 ml  Net  -1496 ml   Filed Weights   07/03/14 1357  Weight: 95.9 kg (211 lb 6.7 oz)    Exam:   General:  NAD  Cardiovascular:  irregularly irregular  Respiratory: Fine crackles noted right greater than left with improvement. No wheezing. No rhonchi.  Abdomen: Soft, nontender, nondistended, positive bowel sounds.  Musculoskeletal: No clubbing or cyanosis. Status post left BKA. Left upper extremity warm, good perfusion.  Data  Reviewed: Basic Metabolic Panel:  Recent Labs Lab 07/06/14 0315 07/08/14 0455 07/09/14 0247 07/10/14 0610 07/11/14 0330  NA 139 139 138 135 135  K 3.7 3.2* 4.1 4.1 4.7  CL 99 95* 96 96 95*  CO2 30 32 36* 32 32  GLUCOSE 181* 165* 176* 139* 115*  BUN 22 25* 24* 23 23  CREATININE 0.84 0.99 1.01 0.80 0.88  CALCIUM 8.5 8.8 8.7 8.5 8.7  MG  --   --   --  2.5  --    Liver Function Tests: No results for input(s): AST, ALT, ALKPHOS, BILITOT, PROT, ALBUMIN in the last 168 hours. No results for input(s): LIPASE, AMYLASE in the last 168 hours. No results for input(s): AMMONIA in the last 168 hours. CBC:  Recent Labs Lab 07/07/14 0355 07/08/14 0455 07/09/14 0247 07/10/14 0610 07/11/14 0330  WBC 16.8* 18.5* 21.1* 23.8* 20.1*  NEUTROABS  --  16.4*  --   --   --   HGB 11.4* 12.4* 11.6* 11.9* 11.9*  HCT 35.5* 38.5* 36.3* 37.2* 36.9*  MCV 87.2 88.1 87.3 87.1 87.4  PLT 277 316 290 326 321   Cardiac Enzymes: No results for input(s): CKTOTAL, CKMB, CKMBINDEX, TROPONINI in the last 168 hours. BNP (last 3 results) No results for input(s): PROBNP in the last 8760 hours. CBG: No results for input(s): GLUCAP in the last 168 hours.  Recent Results (from the past 240 hour(s))  MRSA PCR Screening     Status: None   Collection Time: 07/03/14  2:04 PM  Result Value Ref Range Status   MRSA by PCR NEGATIVE NEGATIVE Final    Comment:        The GeneXpert MRSA Assay (FDA approved for NASAL specimens only), is one component of a comprehensive MRSA colonization surveillance program. It is not intended to diagnose MRSA  infection nor to guide or monitor treatment for MRSA infections.      Studies: No results found.  Scheduled Meds: . apixaban  10 mg Oral BID  . atorvastatin  40 mg Oral QHS  . bethanechol  25 mg Oral TID  . budesonide  0.25 mg Nebulization BID  . digoxin  125 mcg Oral Daily  . diltiazem  180 mg Oral Q12H  . docusate sodium  100 mg Oral Daily  . furosemide  40 mg  Oral Daily  . gabapentin  600 mg Oral BID  . ipratropium  0.5 mg Nebulization Q6H  . levalbuterol  0.63 mg Nebulization Q6H  . levofloxacin  500 mg Oral Daily  . pantoprazole  40 mg Oral Daily  . potassium chloride SA  20 mEq Oral Daily  . predniSONE  60 mg Oral Q breakfast  . sodium chloride  3 mL Intravenous Q12H   Continuous Infusions:   Principal Problem:   Brachial artery occlusion, left Active Problems:   Acute respiratory failure   COPD (chronic obstructive pulmonary disease) with emphysema   CAP (community acquired pneumonia)   A-fib   Protein-calorie malnutrition, severe   Atrial fibrillation, unspecified   Current use of long term anticoagulation   Acute brachial vein embolism   Persistent atrial fibrillation   PNA (pneumonia)   Pulmonary infiltrates   COPD (chronic obstructive pulmonary disease)    Time spent: 59 mins    [facility] MD [facility] Pager [phone]. If 7PM-7AM, please contact night-coverage at [email or web address], password [facility] 07/11/2014, 11:12 AM  LOS: 8 days

## 2014-07-11 NOTE — Progress Notes (Signed)
Pt placed on CPAP per his home regimen 10 cm H20 with 3 lpm O2.

## 2014-07-12 ENCOUNTER — Inpatient Hospital Stay (HOSPITAL_COMMUNITY): Payer: Medicare Other

## 2014-07-12 LAB — BASIC METABOLIC PANEL
Anion gap: 7 (ref 5–15)
BUN: 22 mg/dL (ref 6–23)
CALCIUM: 8.4 mg/dL (ref 8.4–10.5)
CO2: 32 mmol/L (ref 19–32)
Chloride: 95 mmol/L — ABNORMAL LOW (ref 96–112)
Creatinine, Ser: 0.87 mg/dL (ref 0.50–1.35)
GFR calc Af Amer: >90 mL/min (ref >90)
GFR calc non Af Amer: 87 mL/min — ABNORMAL LOW (ref >90)
GLUCOSE: 98 mg/dL (ref 70–99)
POTASSIUM: 4.3 mmol/L (ref 3.5–5.1)
Sodium: 134 mmol/L — ABNORMAL LOW (ref 135–145)

## 2014-07-12 LAB — CBC
HEMATOCRIT: 37.6 % — AB (ref 39.0–52.0)
Hemoglobin: 12.1 g/dL — ABNORMAL LOW (ref 13.0–17.0)
MCH: 27.8 pg (ref 26.0–34.0)
MCHC: 32.2 g/dL (ref 30.0–36.0)
MCV: 86.2 fL (ref 78.0–100.0)
Platelets: 323 10*3/uL (ref 150–400)
RBC: 4.36 MIL/uL (ref 4.22–5.81)
RDW: 14.7 % (ref 11.5–15.5)
WBC: 17.8 10*3/uL — ABNORMAL HIGH (ref 4.0–10.5)

## 2014-07-12 LAB — MAGNESIUM: MAGNESIUM: 2.4 mg/dL (ref 1.5–2.5)

## 2014-07-12 MED ORDER — PREDNISONE 20 MG PO TABS
40.0000 mg | ORAL_TABLET | Freq: Every day | ORAL | Status: DC
Start: 2014-07-12 — End: 2014-08-30

## 2014-07-12 MED ORDER — DILTIAZEM HCL 90 MG PO TABS
180.0000 mg | ORAL_TABLET | Freq: Two times a day (BID) | ORAL | Status: DC
Start: 2014-07-12 — End: 2017-01-17

## 2014-07-12 MED ORDER — APIXABAN 5 MG PO TABS: 10.0000 mg | ORAL_TABLET | Freq: Two times a day (BID) | ORAL | Status: DC

## 2014-07-12 MED ORDER — TIOTROPIUM BROMIDE MONOHYDRATE 18 MCG IN CAPS: 18.0000 ug | ORAL_CAPSULE | Freq: Every day | RESPIRATORY_TRACT | Status: DC

## 2014-07-12 MED ORDER — BUDESONIDE 0.25 MG/2ML IN SUSP: 0.2500 mg | Freq: Two times a day (BID) | RESPIRATORY_TRACT | Status: DC

## 2014-07-12 MED ORDER — LEVALBUTEROL HCL 1.25 MG/3ML IN NEBU: 1.2500 mg | INHALATION_SOLUTION | Freq: Three times a day (TID) | RESPIRATORY_TRACT | Status: DC

## 2014-07-12 MED ORDER — OXYCODONE HCL 30 MG PO TABS: 30.0000 mg | ORAL_TABLET | ORAL | Status: DC | PRN

## 2014-07-12 MED ORDER — OXYCODONE HCL 30 MG PO TABS: 30.0000 mg | ORAL_TABLET | ORAL | Status: AC | PRN

## 2014-07-12 NOTE — Progress Notes (Signed)
Patient discharged to home with wife and son, discharge instructions and prescriptions given to the patient and his wide. Both the patient and his spouse verbalized understanding of instructions given. [facility] home health delivered o2 tank, patient instructed on use and verbalized understanding. [place], RN

## 2014-07-12 NOTE — Progress Notes (Signed)
ED CM received call from [facility] on 2W regarding call from patient about not being able to afford his Eliquis.  Patient was at [place] in [place]. ED CM contacted [facility] about faxing a free 30 day trial card, after activation.  Contacted patient to assist with activation process. Activated card faxed to [facility] at [phone], called to verify receipt. Updated patient on the activation and to contact pharmacy regarding medication pick up. Patient verbalized understanding and appreciation. No further CM needs identified.

## 2014-07-12 NOTE — Progress Notes (Signed)
Occupational Therapy Treatment Patient Details Name: Grant Santos MRN: [id number] DOB: Oct 17, 1945 Today's Date: 07/12/2014    History of present illness pt presents post L brachial and ulnar artery thrombectomy with PNA and hx of COPD and L BKA.     OT comments  Pt slowly progressing with adls. Pt has met 2 of goals set below. Pt continues to be very SOB whenever he is on his feet during adls. Pt has been educated re energy conservation techniques and pursed lip breathing and can state things he can do at home to conserve energy.    Follow Up Recommendations  Home health OT;Supervision/Assistance - 24 hour    Equipment Recommendations  None recommended by OT    Recommendations for Other Services      Precautions / Restrictions Precautions Precautions: Fall Precaution Comments: Watch O2 sats.  pt with L LE prosthetic Restrictions Weight Bearing Restrictions: No       Mobility Bed Mobility Overal bed mobility: Modified Independent             General bed mobility comments: EOB on arrival  Transfers Overall transfer level: Modified independent Equipment used: Rolling walker (2 wheeled) Transfers: Sit to/from Stand Sit to Stand: Supervision         General transfer comment: pt demos good use of UEs and safety with transfers.      Balance Overall balance assessment: Needs assistance         Standing balance support: Bilateral upper extremity supported;During functional activity Standing balance-Leahy Scale: Poor Standing balance comment: Pt needs to be holding to walker, sink or something stable when standing.  Pt unable to walk or stand w/o ouside support.                   ADL Overall ADL's : Needs assistance/impaired Eating/Feeding: Independent;Sitting   Grooming: Wash/dry hands;Wash/dry face;Oral care;Min guard;Standing Grooming Details (indicate cue type and reason): Pt needs several rest breaks to complete task.  Pt can sit to do grooming  tasks at home.             Lower Body Dressing: Supervision/safety;Sit to/from stand Lower Body Dressing Details (indicate cue type and reason): Pt donned pants, prosthetic and shoes in sitting with no assist.  S provided to stand pand pull pants up.  Pt becomes extremely fatigued doing anything in standing.  Toilet Transfer: Min guard;Ambulation;RW Toilet Transfer Details (indicate cue type and reason): Pt fatigues and becomes SOB so quickly that min guard provided. Toileting- Water quality scientist and Hygiene: Min guard;Sit to/from stand       Functional mobility during ADLs: Rolling walker;Min guard General ADL Comments: Reviewed breathing techniques.  Pt does well with activities in sitting but did not do well with breathing in standing. Pt requested to sit most of session. Pt has a power jazzy chair at home with ramp to get in.  Pt states he will sit for most tasks.  Reviewed all energy conservation options for home.  Do have concerns about pt going home due to SOB.      Vision                     Perception     Praxis      Cognition   Behavior During Therapy: [facility] for tasks assessed/performed Overall Cognitive Status: Within Functional Limits for tasks assessed  Extremity/Trunk Assessment               Exercises     Shoulder Instructions       General Comments      Pertinent Vitals/ Pain       Pain Assessment: 0-10 Pain Score: 2  Pain Location: L side "Nerve pain" Pain Descriptors / Indicators: Burning Pain Intervention(s): Monitored during session  Home Living                                          Prior Functioning/Environment              Frequency Min 2X/week     Progress Toward Goals  OT Goals(current goals can now be found in the care plan section)  Progress towards OT goals: Progressing toward goals  Acute Rehab OT Goals Patient Stated Goal: Get around without being SOB.    OT Goal Formulation: With patient Time For Goal Achievement: 07/19/14 Potential to Achieve Goals: Good ADL Goals Pt Will Perform Grooming: with modified independence;standing Pt Will Perform Upper Body Bathing: with modified independence;standing;sitting Pt Will Perform Lower Body Bathing: with modified independence;sit to/from stand Pt Will Perform Lower Body Dressing: with modified independence;sit to/from stand Pt Will Transfer to Toilet: with modified independence;ambulating Pt Will Perform Toileting - Clothing Manipulation and hygiene: with modified independence;sit to/from stand Additional ADL Goal #1: Pt will independently verbalize and demonstrate 3/3 energy conservation techniques.  Plan Discharge plan remains appropriate    Co-evaluation                 End of Session     Activity Tolerance Patient limited by fatigue   Patient Left in chair;with call bell/phone within reach   Nurse Communication Mobility status;Other (comment) (pt wants to talk to case manager about O2)        Time: 1000-1015 OT Time Calculation (min): 15 min  Charges: OT General Charges $OT Visit: 1 Procedure OT Treatments $Self Care/Home Management : 8-22 mins  [name] Peers 07/12/2014, 10:25 AM  [phone]

## 2014-07-12 NOTE — Progress Notes (Signed)
Medicare Important Message given? YES  (If response is "NO", the following Medicare IM given date fields will be blank)  Date Medicare IM given: 07/12/14 Medicare IM given by:  Voris Tigert  

## 2014-07-12 NOTE — Progress Notes (Signed)
Patient Name: Grant Santos Date of Encounter: 07/12/2014     Principal Problem:   Brachial artery occlusion, left Active Problems:   COPD (chronic obstructive pulmonary disease) with emphysema   CAP (community acquired pneumonia)   A-fib   Acute respiratory failure   Protein-calorie malnutrition, severe   Atrial fibrillation, unspecified   Current use of long term anticoagulation   Acute brachial vein embolism   Persistent atrial fibrillation   PNA (pneumonia)   Pulmonary infiltrates   COPD (chronic obstructive pulmonary disease)    SUBJECTIVE  Feeling quite well except for DOE. He is okay at rest but becomes very SOB with minimal activity.   CURRENT MEDS . apixaban  10 mg Oral BID  . atorvastatin  40 mg Oral QHS  . bethanechol  25 mg Oral TID  . budesonide  0.25 mg Nebulization BID  . digoxin  125 mcg Oral Daily  . diltiazem  180 mg Oral Q12H  . docusate sodium  100 mg Oral Daily  . furosemide  40 mg Oral Daily  . gabapentin  600 mg Oral BID  . ipratropium  0.5 mg Nebulization Q6H  . levalbuterol  0.63 mg Nebulization Q6H  . levofloxacin  500 mg Oral Daily  . pantoprazole  40 mg Oral Daily  . potassium chloride SA  20 mEq Oral Daily  . predniSONE  40 mg Oral Q breakfast  . sodium chloride  3 mL Intravenous Q12H    OBJECTIVE  Filed Vitals:   07/11/14 2100 07/11/14 2323 07/12/14 0106 07/12/14 0459  BP: 98/56   117/63  Pulse: 68 75  79  Temp: 98.2 F (36.8 C)   98 F (36.7 C)  TempSrc: Oral   Oral  Resp: 20 19  20   Height:      Weight:      SpO2: 89% 92% 88%     Intake/Output Summary (Last 24 hours) at 07/12/14 0758 Last data filed at 07/12/14 0500  Gross per 24 hour  Intake    480 ml  Output   2475 ml  Net  -1995 ml   Filed Weights   07/03/14 1357  Weight: 211 lb 6.7 oz (95.9 kg)    PHYSICAL EXAM  General: Pleasant, NAD. Neuro: Alert and oriented X 3. Moves all extremities spontaneously. Psych: Normal affect. HEENT:  Normal  Neck:  Supple without bruits or JVD. Lungs:  Resp regular and unlabored, CTA. Heart: irreg irrgeg Abdomen: Soft, non-tender, non-distended, BS + x 4.  Extremities: No clubbing, cyanosis or edema. DP/PT/Radials 2+ and equal bilaterally. S/p L BKA  Accessory Clinical Findings  CBC  Recent Labs  07/11/14 0330 07/12/14 0515  WBC 20.1* 17.8*  HGB 11.9* 12.1*  HCT 36.9* 37.6*  MCV 87.4 86.2  PLT 321 284   Basic Metabolic Panel  Recent Labs  07/10/14 0610 07/11/14 0330 07/12/14 0515  NA 135 135 134*  K 4.1 4.7 4.3  CL 96 95* 95*  CO2 32 32 32  GLUCOSE 139* 115* 98  BUN 23 23 22   CREATININE 0.80 0.88 0.87  CALCIUM 8.5 8.7 8.4  MG 2.5  --  2.4    TELE  afib with CVR. Some brady overnight  Radiology/Studies  Dg Chest 2 View  07/09/2014   CLINICAL DATA:  Followup pulmonary infiltrates.  EXAM: CHEST  2 VIEW  COMPARISON:  07/07/2014  FINDINGS: The cardiac silhouette, mediastinal and hilar contours are stable. Stable tortuosity and ectasia of the thoracic aorta. Stable underlying emphysematous changes  with superimposed airspace process, slightly improved. No pleural effusion or pneumothorax.  IMPRESSION: Underlying emphysema and superimposed asymmetric airspace process slightly improved since prior chest x-ray.   Electronically Signed   By: Kalman Jewels M.D.   On: 07/09/2014 07:44   Dg Chest 2 View  07/07/2014   CLINICAL DATA:  69 year old male with a history of shortness of breath  EXAM: CHEST - 2 VIEW  COMPARISON:  CT 07/04/2014, chest x-ray 07/04/2014, 07/03/2014  FINDINGS: Cardiomediastinal silhouette unchanged in size and contour, partially obscured by overlying lung and pleural disease.  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  Surgical changes of median sternotomy and CABG.  Similar pattern and distribution of interstitial and airspace disease of the bilateral lungs, sparing the superior left lung.  No pleural effusion.  No  pneumothorax.  No displaced fracture.  Similar appearance of the vertebral bodies better visualized.  IMPRESSION: Similar distribution and appearance of mixed airspace and interstitial disease of the right greater than left lungs. Aeration at the superior left lung is maintained.  Surgical changes of prior median sternotomy and CABG.  Calcified pseudoaneurysm of the descending aorta, better characterized on prior CT.  Signed,  Dulcy Fanny. Earleen Newport, DO  [facility]  [facility]   Electronically Signed   By: Corrie Mckusick D.O.   On: 07/07/2014 07:50   Dg Chest 2 View  07/04/2014   CLINICAL DATA:  Thrombectomy yesterday.  EXAM: CHEST  2 VIEW  COMPARISON:  07/03/2014  FINDINGS: Previous median sternotomy and CABG procedure. Stable heart size. Bilateral airspace opacities are again identified and appear unchanged from previous exam.  IMPRESSION: No change in aeration the lungs compared with previous exam.   Electronically Signed   By: Kerby Moors M.D.   On: 07/04/2014 09:08   Ct Angio Chest Aorta W/cm &/or Wo/cm  07/04/2014   CLINICAL DATA:  Hypoxia shortness of breath, COPD and pneumonia. Left brachial and ulnar arm thrombectomy eat yesterday for embolic occlusion.  EXAM: CT ANGIOGRAPHY CHEST WITH CONTRAST  TECHNIQUE: Multidetector CT imaging of the chest was performed using the standard protocol during bolus administration of intravenous contrast. Multiplanar CT image reconstructions and MIPs were obtained to evaluate the vascular anatomy.  CONTRAST:  39mL OMNIPAQUE IOHEXOL 350 MG/ML SOLN  COMPARISON:  07/02/2014 CTA at [facility] and prior unenhanced CT of the chest on 03/30/2011  FINDINGS: Compared to the most recent CTA 2 days ago, there is stable diffuse ground-glass airspace disease and fibrotic changes throughout the right lung and also throughout the lower lingula and left lower lobe. Much of this may be on the basis of chronic fibrotic and interstitial  lung disease. However, changes are dramatically worse compared to a CT dated 03/30/2011. Component of active pneumonia cannot be excluded in either lung. No airway obstruction or significant bronchiectasis. There is decrease in pleural fluid since the prior study with trace left pleural fluid remaining.  A number of small mediastinal lymph nodes are again seen. The largest in the prevascular region shows decrease in size in the last 2 days with short axis measurement of 13 mm. There is no evidence of pulmonary embolism. The thoracic aorta shows no evidence of thrombus or dissection. Visualized proximal great vessels are normally patent. 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  Stable mild cardiac enlargement. No pericardial fluid. The visualized upper abdomen is unremarkable. No bony abnormalities.  Review of the  MIP images confirms the above findings.  IMPRESSION: 1. Stable severe bilateral pulmonary disease, more extensive throughout the right lung. This again is suggestive of severe fibrotic lung disease but there may be superimposed pneumonia based on significant change in appearance by CT since 2012. 2. No evidence of pulmonary embolism. 3. No thrombus identified in the thoracic aorta or visualized proximal great vessels. 4. 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   Electronically Signed   By: Aletta Edouard M.D.   On: 07/04/2014 13:49    ASSESSMENT AND PLAN  69 y/o male with history of Afib on Coumadin admitted for acute left upper extremity ischemia in the setting of thrombus in brachial artery extending in to the ulnar artery. INR on arrival was actually supratherapeutic at 3.3. Coumadin was reversed with vitamin K to allow for urgent vascular surgery. S/p left brachial and ulnar artery thromboembolectomy  Acute ischemic LUE ischemia - s/p thromboembolectomy by vascular - INR at time of event was  3.3, coumadin stopped and started on eliquis. Dosing per previous cardiology note of 10mg  bid for 7 days then 5mg  bid.    Afib - on dilt and dig for rate control - rates overall controlled, occasional low rates primarily at night that are asymptomatic. He just restarted his CPAP last night  Acute on chronic respiratory failure - 07/04/14 Echo: LVEF 14-97%, diastolic fxn not described due to afib - continued SOB and O2 requirement, CXR show emphysema and asymmetric airspace process. No clear edema, no significant volume overload by exam. Does not appear to be primarily cardiac, COPD and recent pneumonia managed by primary team. Continue gradual diuresis on oral diuretic. - on lasix 40mg  daily, negative 2.2 liters yesterday and 10L liters since admission. Renal function stable, continue oral diuretic.   [facility]A - recommend CPAP at night, he is on at home.    Cardiology may be able to sign off. MD to see. Followed by Dr. Bettina Gavia.  Judy Pimple PA-C  Pager [phone]   Patient seen and examined. Agree with assessment and plan. AF rate controlled in 80's. Anticoagulated on apixiban with DVT dosing. On supplemental oxygen. For DC later today on supplemental oxygen; cardiology f/u with Dr. Bettina Gavia.   Troy Sine, MD, [facility] 07/12/2014 8:54 AM

## 2014-07-12 NOTE — Discharge Summary (Signed)
Physician Discharge Summary  Grant Santos MVH:[id number] DOB: 04/30/1946 DOA: 07/03/2014  PCP: Nicholos Johns, MD  Admit date: 07/03/2014 Discharge date: 07/12/2014  Time spent: 70 minutes  Recommendations for Outpatient Follow-up:  1. Follow-up with Dr. Bridgett Larsson, vascular surgery as scheduled. 2. Follow up with Dr Gwenette Greet, Pulmonary 07/19/14. Patient will need CXR on follow up. 3. Follow up with Dr Bettina Gavia, in 1-2 weeks. 4. Follow up with Nicholos Johns, MD in 1-2 weeks. On follow up will need BMET and CBC.  Discharge Diagnoses:  Principal Problem:   Brachial artery occlusion, left Active Problems:   Acute respiratory failure   COPD (chronic obstructive pulmonary disease) with emphysema   CAP (community acquired pneumonia)   A-fib   Protein-calorie malnutrition, severe   Atrial fibrillation, unspecified   Current use of long term anticoagulation   Acute brachial vein embolism   Persistent atrial fibrillation   PNA (pneumonia)   Pulmonary infiltrates   COPD (chronic obstructive pulmonary disease)   Discharge Condition: Stable and improved  Diet recommendation: Heart healthy  Filed Weights   07/03/14 1357  Weight: 95.9 kg (211 lb 6.7 oz)    History of present illness:  Grant Santos is a 69 y.o. male with a past medical history of chronic obstructive pulmonary disease, history of left lower extremity amputation, presented as a transfer from [facility]. Patient was admitted to [facility] on 07/02/2014 presenting with complaints of increasing shortness of breath, productive cough, found to be in hypoxemic respiratory failure. He was worked up with a CT scan of lungs which did not show evidence of pulmonary embolism however did show evidence of pneumonia with superimposed COPD changes. He was admitted to the step down unit and started on empiric IV antimicrobial therapy with Levaquin and imipenem. He was also treated with IV steroids, bronchodilators, supplemental oxygen. On the  morning of 07/03/2014 patient complained of sudden onset left forearm pain. Staff noted extremity to be cool and cyanotic as he had Dopplers performed at that facility. This revealed distal brachial artery occlusion. Patient was transferred to [facility] [place] to be evaluated by vascular surgery. Patient with his history of atrial fibrillation and had been anticoagulated with warfarin. He had INR of 3.6. Case was discussed with Dr. Bridgett Larsson of vascular surgery who requested type and cross with transfusion of 2 units of fresh frozen plasma.    Hospital Course:  Patient is a 69 year old with a past medical history of chronic obstructive pulmonary disease, chronic pain syndrome, narcotic dependent, presented as a transfer from [facility] where he initially was admitted on 07/02/2014 with complaints of shortness of breath, productive cough found to be hypoxemic respiratory failure. He was initially worked up with a CT scan of lungs but did not show evidence of pulmonary embolism. Their were findings on CT to suggest pneumonia. This hospitalization was complicated by the development of brachial artery occlusion or which she was transferred to [facility] to be evaluated by vascular surgery. He was taken to the OR on 07/03/2014 underwent left brachial and ulnar artery thromboembolectomy. Postoperatively he was started on anticoagulation with IV heparin. Cardiology was consulted who recommended transitioning to Eliquis. With regard to his respiratory status, patient likely having COPD exacerbation precipitated by community acquired pneumonia. He was treated with IV steroids, nebulizers, empiric IV antimicrobial therapy with ceftriaxone and azithromycin. Pulmonary critical care medicine was consulted given lack of improvement from  pneumonia/COPD. His antibiotics were broadened. Patient will certainly need home oxygen.   Assessment/Plan: #1 left  upper extremity brachial artery occlusion extending to the ulnar artery Status post urgent left brachial and ulnar artery thrombectomy per Dr. Bridgett Larsson 07/03/2014. Patient had CT angiogram performed the [facility] which reportedly did not show any evidence of PE.  Repeat CT of the lungs with IV contrast performed 07/04/2014 with stable severe bilateral pulmonary disease more extensive throughout right lung. Severe fibrotic lung disease noted. No evidence of PE. Specifically there is no thrombus identified in the thoracic aorta or visualized in the great vessels. Patient had good perfusion in his left upper extremity post thrombectomy. Patient was started on eliquis at 10 mg twice daily for 7 days and then 5 mg twice daily per cardiology recommendations. Patient will follow-up with Dr. Bridgett Larsson of vascular surgery as outpatient.  #2 acute hypoxic respiratory failure Likely multifactorial secondary to pulmonary fibrosis in the setting of COPD exacerbation and pneumonia. Patient had chest x-ray done 07/04/2014 which did not show any changes to prior chest x-ray. Repeat chest x-ray done 07/07/2014 with similar distribution and appearance of mixed airspace and interstitial disease of the right greater than left lungs. Aeration of the superior left lung is maintained. Chest x-ray done  07/09/2014 with underlying emphysema and superimposed asymmetric S since slightly improved since prior chest x-ray.  Repeat CT performed 07/04/2014 showed severe bilateral pulmonary disease more extensive throughout the right with severe fibrotic lung changes.  Patient was seen in consultation by pulmonary due to his worsening respiratory status during the hospitalization. Patient's antibiotic coverage was broadened from IV Rocephin and azithromycin to IV vancomycin and Zosyn. Patient was placed on IV steroids  which were tapered down to oral steroids. Patient was maintained on a nebulizer regimen. Patient improved slowly during the hospitalization or IV antibiotics was subsequently transitioned to oral Levaquin and patient completed 9 day course of antibiotic therapy. Patient improved slowly his oxygen was titrated down such that by day of discharge was on 4 L nasal cannula with oxygen saturations between 88 and 93%. Patient be discharged on a steroid taper. Patient is to follow-up with Dr. Gwenette Greet of pulmonary in 1 week.  #3 community acquired pneumonia On admission chest x-ray done was concerning for community-acquired pneumonia. Patient was initially placed on IV Rocephin and azithromycin however due to worsening pulmonary status antibiotic doses were broadened to IV vancomycin and Zosyn per pulmonary. Patient improved clinically results in addition also improved and was subsequently transitioned to oral Levaquin. Patient received 9 days of antibiotic therapy and will be discharged home in stable and improved condition.   #4 acute COPD exacerbation On admission patient had presented with acute hypoxic respiratory failure which is felt to be multifactorial. Patient was noted to have a component of an acute COPD exacerbation. Patient was placed on antibiotics, IV steroids, scheduled nebulizers. Patient was followed by pulmonary throughout the hospitalization. Patient's IV steroids were tapered down and patient be discharged home on oral steroid taper. Patient received adequate amount of antibiotics during the hospitalization and does not require any further antibiotics as outpatient. Patient be discharged on his current bronchodilator regimen. Patient will follow-up with Dr. Gwenette Greet pulmonary in 1 week. Patient be discharged in stable and improved condition on 4 L nasal cannula of oxygen.  #5 chronic pain syndrome narcotic dependent Stable. Continued on home regimen.   #6 atrial fibrillation Patient was noted  to be in A. fib with RVR. Patient was seen in consultation by cardiology. Patient was maintained on his digoxin. Cardizem doses were adjusted for better rate control per  cardiology. Patient's heart rate improved on a dose of diltiazem 180 mg twice daily. Patient was also started on Eliquis for anticoagulation. Patient was discharged home in stable and improved condition and is to follow-up with his cardiologist Dr. Bettina Gavia as outpatient.   #7 dysuria UA negative. Resolved.   Procedures:  Left brachial and ulnar artery thrombectomy per Dr. Bridgett Larsson Vascular surgery 07/03/2014  CT chest 07/04/2014  2-D echo 07/04/2014  Chest x-ray 07/04/2014, 07/07/2014, 07/09/2014  Consultations:  Vascular surgery: Dr. Bridgett Larsson 07/03/2014  PCCM Dr. Nelda Marseille 07/05/2014  Cardiology: Dr. Caryl Comes 07/05/2014  Discharge Exam: Filed Vitals:   07/12/14 0854  BP:   Pulse: 93  Temp:   Resp: 20    General: NAD Cardiovascular: Irregularly irregular Respiratory: Improved crackles right greater than left. No wheezing.  Discharge Instructions   Discharge Instructions    Diet - low sodium heart healthy    Complete by:  As directed      Discharge instructions    Complete by:  As directed   Follow-up with Dr. Gale Journey Parrott,NP on 07/19/2014 at 3:15 PM. Follow-up with Dr. Bridgett Larsson as scheduled. Follow up with Dr Bettina Gavia in 1-2 weeks. Follow up with Nicholos Johns, MD in 1-2 weeks. \     Increase activity slowly    Complete by:  As directed           Current Discharge Medication List    START taking these medications   Details  apixaban (ELIQUIS) 5 MG TABS tablet Take 2 tablets (10 mg total) by mouth 2 (two) times daily. Take 2 tablets (10mg ) 2 times daily x 3 days, then 1 tablet (5mg ) 2 times daily starting 07/16/14. Qty: 90 tablet, Refills: 0    budesonide (PULMICORT) 0.25 MG/2ML nebulizer solution Take 2 mLs (0.25 mg total) by nebulization 2 (two) times daily. Qty: 60 mL, Refills: 3    diltiazem  (CARDIZEM) 90 MG tablet Take 2 tablets (180 mg total) by mouth every 12 (twelve) hours. Qty: 120 tablet, Refills: 0    predniSONE (DELTASONE) 20 MG tablet Take 2 tablets (40 mg total) by mouth daily with breakfast. Take 2 tablets (40mg ) daily x 2 days, then 1 tablet (20mg ) daily x 3 days then stop. Qty: 7 tablet, Refills: 0    tiotropium (SPIRIVA HANDIHALER) 18 MCG inhalation capsule Place 1 capsule (18 mcg total) into inhaler and inhale daily. Qty: 30 capsule, Refills: 0      CONTINUE these medications which have CHANGED   Details  levalbuterol (XOPENEX) 1.25 MG/3ML nebulizer solution Take 1.25 mg by nebulization every 8 (eight) hours. Qty: 72 mL, Refills: 0    oxycodone (ROXICODONE) 30 MG immediate release tablet Take 1 tablet (30 mg total) by mouth every 4 (four) hours as needed for pain. Qty: 20 tablet, Refills: 0      CONTINUE these medications which have NOT CHANGED   Details  acetaminophen (TYLENOL) 325 MG tablet Take 650 mg by mouth 3 (three) times daily as needed for headache.    ADVAIR DISKUS 250-50 MCG/DOSE AEPB INHALE 1 PUFFS EVERY 12 HOURS Qty: 60 each, Refills: 6    albuterol (PROVENTIL HFA;VENTOLIN HFA) 108 (90 BASE) MCG/ACT inhaler Inhale 2 puffs into the lungs every 6 (six) hours as needed for shortness of breath. Qty: 1 Inhaler, Refills: 6    Alpha Lipoic Acid 200 MG CAPS Take 3 capsules by mouth daily.    atorvastatin (LIPITOR) 40 MG tablet Take 40 mg by mouth at bedtime. Refills: 2    bethanechol (URECHOLINE)  25 MG tablet Take 25 mg by mouth 3 (three) times daily. Refills: 6    digoxin (LANOXIN) 0.125 MG tablet Take 125 mcg by mouth daily. Refills: 0    donepezil (ARICEPT) 10 MG tablet Take 1 tablet by mouth daily.    esomeprazole (NEXIUM) 20 MG capsule Take 20 mg by mouth daily at 12 noon.    Fish Oil-Cholecalciferol (FISH OIL + D3) 1000-1000 MG-UNIT CAPS Take 1 capsule by mouth daily.    furosemide (LASIX) 40 MG tablet Take 40 mg by mouth every  morning.     gabapentin (NEURONTIN) 600 MG tablet Take 600 mg by mouth 2 (two) times daily.     GLUCOSAMINE HCL-MSM PO Take 3 tablets by mouth daily.    glucosamine-chondroitin 500-400 MG tablet Take 1 tablet by mouth daily.     KLOR-CON M20 20 MEQ tablet Take 20 mEq by mouth daily. At noon Refills: 0    Multiple Vitamin (MULTIVITAMIN) capsule Take 1 capsule by mouth daily.      nitroGLYCERIN (NITROSTAT) 0.4 MG SL tablet Place 0.4 mg under the tongue every 5 (five) minutes as needed.      polyethylene glycol (MIRALAX / GLYCOLAX) packet Take 17 g by mouth every other day.    ramipril (ALTACE) 2.5 MG capsule Take 2.5 mg by mouth daily.      !! tiZANidine (ZANAFLEX) 2 MG tablet Take 2 mg by mouth every morning.     !! tiZANidine (ZANAFLEX) 2 MG tablet Take 2 mg by mouth 2 (two) times daily as needed for muscle spasms.    TUDORZA PRESSAIR 400 MCG/ACT AEPB INHALE 1 PUFF TWICE DAILY Qty: 1 each, Refills: 3    Wheat Dextrin (BENEFIBER PO) Take 1 each by mouth every other day. 1 capful every other day     !! - Potential duplicate medications found. Please discuss with provider.    STOP taking these medications     aspirin EC 81 MG tablet      diltiazem (CARDIZEM CD) 240 MG 24 hr capsule      guaiFENesin (MUCINEX) 600 MG 12 hr tablet      isosorbide mononitrate (IMDUR) 60 MG 24 hr tablet      naproxen sodium (ANAPROX) 220 MG tablet        Allergies  Allergen Reactions  . Fentanyl     Makes breathing problems worse  . Metoprolol Tartrate     REACTION: sob  . Morphine Sulfate Itching   Follow-up Information    Follow up with Hinda Lenis, MD In 2 weeks.   Specialty:  Vascular Surgery   Why:  Office will call you to arrange your appt (sent)   Contact information:   [place] [address] [phone]       Follow up with Kathee Delton, MD On 07/19/2014.   Specialty:  Pulmonary Disease   Why:  f/u at 315pm   Contact information:   [place] [address] [phone]       Follow up with Shirlee More, MD.   Specialty:  Cardiology   Why:  f/u in 1-2 weeks   Contact information:   [address] [place] [address] [phone]       Follow up with Nicholos Johns, MD. Schedule an appointment as soon as possible for a visit in 1 week.   Specialty:  Internal Medicine   Why:  f/u in 1-2 weeks.   Contact information:   [address] [place]  [address] [phone]        The results of significant diagnostics from this hospitalization (including imaging, microbiology, ancillary and laboratory) are listed below for reference.    Significant Diagnostic Studies: Dg Chest 2 View  07/12/2014   CLINICAL DATA:  Asthma.  Shortness of breath.  EXAM: CHEST  2 VIEW  COMPARISON:  07/09/2014.  FINDINGS: Mediastinum and hilar structures are normal. Prior CABG. Heart size stable. Persistent diffuse bilateral pulmonary infiltrates are noted. No significant pleural effusion or pneumothorax. Prior lumbar spine fusion.  IMPRESSION: 1. Persistent bilateral pulmonary infiltrates. 2. Prior CABG.  Stable heart size.  No pulmonary venous congestion .   Electronically Signed   By: Marcello Moores  Register   On: 07/12/2014 08:33   Dg Chest 2 View  07/09/2014   CLINICAL DATA:  Followup pulmonary infiltrates.  EXAM: CHEST  2 VIEW  COMPARISON:  07/07/2014  FINDINGS: The cardiac silhouette, mediastinal and hilar contours are stable. Stable tortuosity and ectasia of the thoracic aorta. Stable underlying emphysematous changes with superimposed airspace process, slightly improved. No pleural effusion or pneumothorax.  IMPRESSION: Underlying emphysema and superimposed asymmetric airspace process slightly improved since prior chest x-ray.   Electronically Signed   By: Kalman Jewels M.D.   On: 07/09/2014 07:44   Dg Chest 2 View  07/07/2014   CLINICAL DATA:  69 year old male with a history of shortness of breath  EXAM: CHEST - 2 VIEW   COMPARISON:  CT 07/04/2014, chest x-ray 07/04/2014, 07/03/2014  FINDINGS: Cardiomediastinal silhouette unchanged in size and contour, partially obscured by overlying lung and pleural disease.  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  Surgical changes of median sternotomy and CABG.  Similar pattern and distribution of interstitial and airspace disease of the bilateral lungs, sparing the superior left lung.  No pleural effusion.  No pneumothorax.  No displaced fracture.  Similar appearance of the vertebral bodies better visualized.  IMPRESSION: Similar distribution and appearance of mixed airspace and interstitial disease of the right greater than left lungs. Aeration at the superior left lung is maintained.  Surgical changes of prior median sternotomy and CABG.  Calcified pseudoaneurysm of the descending aorta, better characterized on prior CT.  Signed,  Dulcy Fanny. Earleen Newport, DO  [facility]  [facility]   Electronically Signed   By: Corrie Mckusick D.O.   On: 07/07/2014 07:50   Dg Chest 2 View  07/04/2014   CLINICAL DATA:  Thrombectomy yesterday.  EXAM: CHEST  2 VIEW  COMPARISON:  07/03/2014  FINDINGS: Previous median sternotomy and CABG procedure. Stable heart size. Bilateral airspace opacities are again identified and appear unchanged from previous exam.  IMPRESSION: No change in aeration the lungs compared with previous exam.   Electronically Signed   By: Kerby Moors M.D.   On: 07/04/2014 09:08   Ct Angio Chest Aorta W/cm &/or Wo/cm  07/04/2014   CLINICAL DATA:  Hypoxia shortness of breath, COPD and pneumonia. Left brachial and ulnar arm thrombectomy eat yesterday for embolic occlusion.  EXAM: CT ANGIOGRAPHY CHEST WITH CONTRAST  TECHNIQUE: Multidetector CT imaging of the chest was performed using the standard protocol during bolus administration of intravenous contrast. Multiplanar CT image reconstructions and  MIPs were obtained to evaluate the vascular anatomy.  CONTRAST:  76mL OMNIPAQUE IOHEXOL 350 MG/ML SOLN  COMPARISON:  07/02/2014 CTA at [facility] and prior unenhanced CT of the chest on 03/30/2011  FINDINGS: Compared to the most recent CTA 2 days ago,  there is stable diffuse ground-glass airspace disease and fibrotic changes throughout the right lung and also throughout the lower lingula and left lower lobe. Much of this may be on the basis of chronic fibrotic and interstitial lung disease. However, changes are dramatically worse compared to a CT dated 03/30/2011. Component of active pneumonia cannot be excluded in either lung. No airway obstruction or significant bronchiectasis. There is decrease in pleural fluid since the prior study with trace left pleural fluid remaining.  A number of small mediastinal lymph nodes are again seen. The largest in the prevascular region shows decrease in size in the last 2 days with short axis measurement of 13 mm. There is no evidence of pulmonary embolism. The thoracic aorta shows no evidence of thrombus or dissection. Visualized proximal great vessels are normally patent. 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  Stable mild cardiac enlargement. No pericardial fluid. The visualized upper abdomen is unremarkable. No bony abnormalities.  Review of the MIP images confirms the above findings.  IMPRESSION: 1. Stable severe bilateral pulmonary disease, more extensive throughout the right lung. This again is suggestive of severe fibrotic lung disease but there may be superimposed pneumonia based on significant change in appearance by CT since 2012. 2. No evidence of pulmonary embolism. 3. No thrombus identified in the thoracic aorta or visualized proximal great vessels. 4. 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   Electronically Signed   By: Aletta Edouard M.D.   On: 07/04/2014  13:49    Microbiology: Recent Results (from the past 240 hour(s))  MRSA PCR Screening     Status: None   Collection Time: 07/03/14  2:04 PM  Result Value Ref Range Status   MRSA by PCR NEGATIVE NEGATIVE Final    Comment:        The GeneXpert MRSA Assay (FDA approved for NASAL specimens only), is one component of a comprehensive MRSA colonization surveillance program. It is not intended to diagnose MRSA infection nor to guide or monitor treatment for MRSA infections.      Labs: Basic Metabolic Panel:  Recent Labs Lab 07/08/14 0455 07/09/14 0247 07/10/14 0610 07/11/14 0330 07/12/14 0515  NA 139 138 135 135 134*  K 3.2* 4.1 4.1 4.7 4.3  CL 95* 96 96 95* 95*  CO2 32 36* 32 32 32  GLUCOSE 165* 176* 139* 115* 98  BUN 25* 24* 23 23 22   CREATININE 0.99 1.01 0.80 0.88 0.87  CALCIUM 8.8 8.7 8.5 8.7 8.4  MG  --   --  2.5  --  2.4   Liver Function Tests: No results for input(s): AST, ALT, ALKPHOS, BILITOT, PROT, ALBUMIN in the last 168 hours. No results for input(s): LIPASE, AMYLASE in the last 168 hours. No results for input(s): AMMONIA in the last 168 hours. CBC:  Recent Labs Lab 07/08/14 0455 07/09/14 0247 07/10/14 0610 07/11/14 0330 07/12/14 0515  WBC 18.5* 21.1* 23.8* 20.1* 17.8*  NEUTROABS 16.4*  --   --   --   --   HGB 12.4* 11.6* 11.9* 11.9* 12.1*  HCT 38.5* 36.3* 37.2* 36.9* 37.6*  MCV 88.1 87.3 87.1 87.4 86.2  PLT 316 290 326 321 323   Cardiac Enzymes: No results for input(s): CKTOTAL, CKMB, CKMBINDEX, TROPONINI in the last 168 hours. BNP: BNP (last 3 results) No results for input(s): PROBNP in the last 8760 hours. CBG: No results for input(s): GLUCAP in the last 168 hours.  SignedIrine Seal MD [facility] 07/12/2014, 10:21 AM

## 2014-07-12 NOTE — Progress Notes (Signed)
Patient had a room air saturation of 86%.

## 2014-07-13 DIAGNOSIS — I4891 Unspecified atrial fibrillation: Secondary | ICD-10-CM | POA: Diagnosis not present

## 2014-07-13 DIAGNOSIS — J449 Chronic obstructive pulmonary disease, unspecified: Secondary | ICD-10-CM | POA: Diagnosis not present

## 2014-07-13 DIAGNOSIS — Z7901 Long term (current) use of anticoagulants: Secondary | ICD-10-CM | POA: Diagnosis not present

## 2014-07-13 DIAGNOSIS — J441 Chronic obstructive pulmonary disease with (acute) exacerbation: Secondary | ICD-10-CM | POA: Diagnosis not present

## 2014-07-13 DIAGNOSIS — Z8701 Personal history of pneumonia (recurrent): Secondary | ICD-10-CM | POA: Diagnosis not present

## 2014-07-13 LAB — RESPIRATORY VIRUS PANEL
Adenovirus: NEGATIVE
INFLUENZA B 1: NEGATIVE
Influenza A: NEGATIVE
METAPNEUMOVIRUS: NEGATIVE
PARAINFLUENZA 1 A: NEGATIVE
PARAINFLUENZA 3 A: NEGATIVE
Parainfluenza 2: NEGATIVE
RESPIRATORY SYNCYTIAL VIRUS B: NEGATIVE
RHINOVIRUS: NEGATIVE
Respiratory Syncytial Virus A: NEGATIVE

## 2014-07-15 DIAGNOSIS — E780 Pure hypercholesterolemia: Secondary | ICD-10-CM | POA: Diagnosis not present

## 2014-07-15 DIAGNOSIS — Z7982 Long term (current) use of aspirin: Secondary | ICD-10-CM | POA: Diagnosis not present

## 2014-07-15 DIAGNOSIS — J8410 Pulmonary fibrosis, unspecified: Secondary | ICD-10-CM | POA: Diagnosis not present

## 2014-07-15 DIAGNOSIS — I509 Heart failure, unspecified: Secondary | ICD-10-CM | POA: Diagnosis not present

## 2014-07-15 DIAGNOSIS — M1990 Unspecified osteoarthritis, unspecified site: Secondary | ICD-10-CM | POA: Diagnosis not present

## 2014-07-15 DIAGNOSIS — J449 Chronic obstructive pulmonary disease, unspecified: Secondary | ICD-10-CM | POA: Diagnosis not present

## 2014-07-15 DIAGNOSIS — R0902 Hypoxemia: Secondary | ICD-10-CM | POA: Diagnosis not present

## 2014-07-15 DIAGNOSIS — I10 Essential (primary) hypertension: Secondary | ICD-10-CM | POA: Diagnosis not present

## 2014-07-16 DIAGNOSIS — Z8701 Personal history of pneumonia (recurrent): Secondary | ICD-10-CM | POA: Diagnosis not present

## 2014-07-16 DIAGNOSIS — I4891 Unspecified atrial fibrillation: Secondary | ICD-10-CM | POA: Diagnosis not present

## 2014-07-16 DIAGNOSIS — Z7901 Long term (current) use of anticoagulants: Secondary | ICD-10-CM | POA: Diagnosis not present

## 2014-07-16 DIAGNOSIS — J441 Chronic obstructive pulmonary disease with (acute) exacerbation: Secondary | ICD-10-CM | POA: Diagnosis not present

## 2014-07-19 ENCOUNTER — Encounter: Payer: Self-pay | Admitting: Vascular Surgery

## 2014-07-19 ENCOUNTER — Encounter: Payer: Self-pay | Admitting: Adult Health

## 2014-07-19 ENCOUNTER — Ambulatory Visit (INDEPENDENT_AMBULATORY_CARE_PROVIDER_SITE_OTHER): Payer: Medicare Other | Admitting: Adult Health

## 2014-07-19 ENCOUNTER — Ambulatory Visit (INDEPENDENT_AMBULATORY_CARE_PROVIDER_SITE_OTHER)
Admission: RE | Admit: 2014-07-19 | Discharge: 2014-07-19 | Disposition: A | Discharge status: Home / Self Care | Payer: Medicare Other | Source: Ambulatory Visit | Attending: Adult Health | Admitting: Adult Health

## 2014-07-19 VITALS — BP 122/66 | HR 81 | Temp 97.5°F | Ht 74.0 in | Wt 208.0 lb

## 2014-07-19 DIAGNOSIS — J189 Pneumonia, unspecified organism: Secondary | ICD-10-CM

## 2014-07-19 DIAGNOSIS — J439 Emphysema, unspecified: Secondary | ICD-10-CM | POA: Diagnosis not present

## 2014-07-19 DIAGNOSIS — Z7901 Long term (current) use of anticoagulants: Secondary | ICD-10-CM | POA: Diagnosis not present

## 2014-07-19 DIAGNOSIS — J441 Chronic obstructive pulmonary disease with (acute) exacerbation: Secondary | ICD-10-CM | POA: Diagnosis not present

## 2014-07-19 DIAGNOSIS — I4891 Unspecified atrial fibrillation: Secondary | ICD-10-CM | POA: Diagnosis not present

## 2014-07-19 DIAGNOSIS — J45909 Unspecified asthma, uncomplicated: Secondary | ICD-10-CM | POA: Diagnosis not present

## 2014-07-19 DIAGNOSIS — Z8701 Personal history of pneumonia (recurrent): Secondary | ICD-10-CM | POA: Diagnosis not present

## 2014-07-19 NOTE — Patient Instructions (Addendum)
Finish Spiriva then go back to [place] .  Finish Budesonide Neb Twice daily  Then resume Advair .  May decrease Oxygen 3l/m  Follow up Dr. Gwenette Greet in 6 weeks with chest xray and As needed

## 2014-07-19 NOTE — Assessment & Plan Note (Signed)
Pneumonia-patient is clinically improving Chest x-ray appears to have some lag time. We'll repeat on return in 6 weeks

## 2014-07-19 NOTE — Assessment & Plan Note (Signed)
Recent exacerbation, now resolving  Plan  Finish Spiriva then go back to [place] .  Finish Budesonide Neb Twice daily  Then resume Advair .  May decrease Oxygen 3l/m  Follow up Dr. Gwenette Greet in 6 weeks with chest xray and As needed

## 2014-07-19 NOTE — Progress Notes (Signed)
Ov reviewed and agree with plan as outlined.  

## 2014-07-19 NOTE — Progress Notes (Signed)
Subjective:    Patient ID: Grant Santos, male    DOB: Apr 11, 1946, 69 y.o.   MRN: [id number]  HPI 69 year old male patient former smoker with COPD and obstructive sleep apnea  07/19/2014 post hospital follow-up Patient returns for post hospital follow-up Patient was admitted January 23 2 07/12/2014 for left brachial artery occlusion. Patient underwent an urgent left brachial and ulna artery thrombectomy . Postprocedure patient had good perfusion. The left upper extremity . He was continued on his anticoagulation regimen with Eliquis.  During his hospital duration. He had a COPD exacerbation and pneumonia. Chest x-ray on January 27 showed mixed airspace disease and interstitial, right greater than left. Patient was started on IV antibiotics, and nebulized bronchodilators was IV steroids. Patient did require oxygen at discharge. He was discharged on Levaquin. Since discharge. Patient is improved with decreased cough and congestion. Does feel weak. During his hospitalization. Patient was treated with Pulmicort and Tudorza in place of his Advair and Spiriva. Unfortunately, patient was discharged on these medications along with his Advair and Spiriva. Patient denies any hemoptysis, chest pain, orthopnea, PND or increased leg swelling.   Review of Systems  .Constitutional:   No  weight loss, night sweats,  Fevers, chills,  +fatigue, or  lassitude.  HEENT:   No headaches,  Difficulty swallowing,  Tooth/dental problems, or  Sore throat,                No sneezing, itching, ear ache, nasal congestion, post nasal drip,   CV:  No chest pain,  Orthopnea, PND, swelling in lower extremities, anasarca, dizziness, palpitations, syncope.   GI  No heartburn, indigestion, abdominal pain, nausea, vomiting, diarrhea, change in bowel habits, loss of appetite, bloody stools.   Resp:   No chest wall deformity  Skin: no rash or lesions.  GU: no dysuria, change in color of urine, no urgency or frequency.  No  flank pain, no hematuria   MS:  No joint pain or swelling.  No decreased range of motion.  No back pain.  Psych:  No change in mood or affect. No depression or anxiety.  No memory loss.          Objective:   Physical Exam GEN: A/Ox3;  NAD, elderly and chronically ill appearing in wc   HEENT:  [place]/AT,  EACs-clear, TMs-wnl, NOSE-clear, THROAT-clear, no lesions, no postnasal drip or exudate noted.   NECK:  Supple w/ fair ROM; no JVD; normal carotid impulses w/o bruits; no thyromegaly or nodules palpated; no lymphadenopathy.  RESP  Decreased BS in bases , no accessory muscle use, no dullness to percussion  CARD:  RRR, no m/r/g  , no peripheral edema, pulses intact, no cyanosis or clubbing. Left forearm with staples in place , well approximated. No redness   GI:   Soft & nt; nml bowel sounds; no organomegaly or masses detected.  Musco: Warm bil, no deformities or joint swelling noted.  Left leg prosthesis .   Neuro: alert, no focal deficits noted.    Skin: Warm, no lesions or rashes   chest X-ray 07/19/2014  This image study was reviewed independently Underlying emphysema with extensive underlying fibrotic type change. Increased opacity in portions of the right lung, particularly in the upper lobe as well as in portions of the left mid lung in left base, may represent a degree of superimposed pneumonia which shows progression over time but has not changed significantly compared to most recent prior study. No change in cardiac silhouette.  Assessment & Plan:

## 2014-07-20 DIAGNOSIS — I4891 Unspecified atrial fibrillation: Secondary | ICD-10-CM | POA: Diagnosis not present

## 2014-07-20 DIAGNOSIS — Z8701 Personal history of pneumonia (recurrent): Secondary | ICD-10-CM | POA: Diagnosis not present

## 2014-07-20 DIAGNOSIS — J441 Chronic obstructive pulmonary disease with (acute) exacerbation: Secondary | ICD-10-CM | POA: Diagnosis not present

## 2014-07-20 DIAGNOSIS — Z7901 Long term (current) use of anticoagulants: Secondary | ICD-10-CM | POA: Diagnosis not present

## 2014-07-21 ENCOUNTER — Encounter: Payer: Self-pay | Admitting: Vascular Surgery

## 2014-07-21 ENCOUNTER — Telehealth: Payer: Self-pay | Admitting: Pulmonary Disease

## 2014-07-21 ENCOUNTER — Ambulatory Visit (INDEPENDENT_AMBULATORY_CARE_PROVIDER_SITE_OTHER): Payer: Self-pay | Admitting: Vascular Surgery

## 2014-07-21 VITALS — BP 107/69 | HR 71 | Temp 98.3°F | Resp 16 | Ht 74.0 in | Wt 203.0 lb

## 2014-07-21 DIAGNOSIS — I719 Aortic aneurysm of unspecified site, without rupture: Secondary | ICD-10-CM

## 2014-07-21 DIAGNOSIS — I82622 Acute embolism and thrombosis of deep veins of left upper extremity: Secondary | ICD-10-CM

## 2014-07-21 DIAGNOSIS — T82390A Other mechanical complication of aortic (bifurcation) graft (replacement), initial encounter: Secondary | ICD-10-CM

## 2014-07-21 HISTORY — DX: Other mechanical complication of aortic (bifurcation) graft (replacement), initial encounter: T82.390A

## 2014-07-21 NOTE — Telephone Encounter (Signed)
Ok to use 3lpm at rest, and 4 liters when moving around.

## 2014-07-21 NOTE — Progress Notes (Signed)
    Postoperative Visit   History of Present Illness  Grant Santos is a 69 y.o. year old male who presents for postoperative follow-up for: L brachial and ulnar artery thromboembolectomy (Date: 07/03/14).  The patient's wounds are healed.  The patient notes resolution of L hand sx.  The patient is not able to complete their activities of daily living due to persistent respiratory limitation.  The patient's current symptoms are: fatigue and difficulty with breathing requiring home oxygen.  For VQI Use Only  PRE-ADM LIVING: Home  AMB STATUS: Wheelchair  Physical Examination  Filed Vitals:   07/21/14 1414  BP: 107/69  Pulse: 71  Temp: 98.3 F (36.8 C)  Resp: 16   LUE: Incision is healed, palpable L ulnar pulse  Medical Decision Making  Grant Santos is a 69 y.o. year old male who presents s/p L brachial and ulnar TE, known thoracic aortic PSA .  Source of TE for L brachial artery embolism was never discovered.  Pt was switched to new anticoagulant by Cardiology.  In further discussions in regards to his thoracic aorta, the patient disclosed he had an "aneurysm repaired".  Will get OR reports as suspicious for a thoracic or TAA repair.  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    Regardless, pt is too sick for any intervention at this point.  Would repeat the chest CTA in 3 months to reimage the thoracic aorta as the patient may need repair if his medical status is improved.  Thank you for allowing [place] to participate in this patient's care.  Adele Barthel, MD [facility] Office: [phone] Pager: [phone]  07/21/2014, 4:36 PM

## 2014-07-21 NOTE — Telephone Encounter (Signed)
LMTCB

## 2014-07-21 NOTE — Telephone Encounter (Signed)
Spoke with Grant Santos at [facility], states that pt is wearing 3lpm continuously and his 02 at rest stays mid 90's but it drops to mid to low 80's after walking 10-20 feet, and drops down to the 70's when he walks to the restroom.  Pt is saying that he was told to try weaning off of his 02 but at his last ov with TP on 07/19/14 the only note of his 02 is to come down to 3lpm.   Hospice is wanting to clarify pt's 02 orders.  They are aware that this will be answered tomorrow.  [place] please advise.  Thank you.

## 2014-07-22 DIAGNOSIS — I4891 Unspecified atrial fibrillation: Secondary | ICD-10-CM | POA: Diagnosis not present

## 2014-07-22 DIAGNOSIS — J441 Chronic obstructive pulmonary disease with (acute) exacerbation: Secondary | ICD-10-CM | POA: Diagnosis not present

## 2014-07-22 DIAGNOSIS — Z7901 Long term (current) use of anticoagulants: Secondary | ICD-10-CM | POA: Diagnosis not present

## 2014-07-22 DIAGNOSIS — Z8701 Personal history of pneumonia (recurrent): Secondary | ICD-10-CM | POA: Diagnosis not present

## 2014-07-22 NOTE — Addendum Note (Signed)
Addended by: Mena Goes on: 07/22/2014 01:57 PM   Modules accepted: Orders

## 2014-07-22 NOTE — Telephone Encounter (Signed)
Return call from [place] [phone].Hillery Hunter

## 2014-07-22 NOTE — Telephone Encounter (Signed)
Called spoke with Molinda Bailiff , made aware. Nothing further needed

## 2014-07-23 DIAGNOSIS — I4891 Unspecified atrial fibrillation: Secondary | ICD-10-CM | POA: Diagnosis not present

## 2014-07-23 DIAGNOSIS — J441 Chronic obstructive pulmonary disease with (acute) exacerbation: Secondary | ICD-10-CM | POA: Diagnosis not present

## 2014-07-23 DIAGNOSIS — Z8701 Personal history of pneumonia (recurrent): Secondary | ICD-10-CM | POA: Diagnosis not present

## 2014-07-23 DIAGNOSIS — Z7901 Long term (current) use of anticoagulants: Secondary | ICD-10-CM | POA: Diagnosis not present

## 2014-07-25 DIAGNOSIS — J441 Chronic obstructive pulmonary disease with (acute) exacerbation: Secondary | ICD-10-CM | POA: Diagnosis not present

## 2014-07-25 DIAGNOSIS — I4891 Unspecified atrial fibrillation: Secondary | ICD-10-CM | POA: Diagnosis not present

## 2014-07-25 DIAGNOSIS — Z7901 Long term (current) use of anticoagulants: Secondary | ICD-10-CM | POA: Diagnosis not present

## 2014-07-25 DIAGNOSIS — Z8701 Personal history of pneumonia (recurrent): Secondary | ICD-10-CM | POA: Diagnosis not present

## 2014-07-27 DIAGNOSIS — R0600 Dyspnea, unspecified: Secondary | ICD-10-CM | POA: Diagnosis not present

## 2014-07-27 DIAGNOSIS — J449 Chronic obstructive pulmonary disease, unspecified: Secondary | ICD-10-CM | POA: Diagnosis not present

## 2014-07-27 DIAGNOSIS — Z7689 Persons encountering health services in other specified circumstances: Secondary | ICD-10-CM | POA: Diagnosis not present

## 2014-07-27 DIAGNOSIS — I480 Paroxysmal atrial fibrillation: Secondary | ICD-10-CM | POA: Diagnosis not present

## 2014-07-28 DIAGNOSIS — I4891 Unspecified atrial fibrillation: Secondary | ICD-10-CM | POA: Diagnosis not present

## 2014-07-28 DIAGNOSIS — Z8701 Personal history of pneumonia (recurrent): Secondary | ICD-10-CM | POA: Diagnosis not present

## 2014-07-28 DIAGNOSIS — J441 Chronic obstructive pulmonary disease with (acute) exacerbation: Secondary | ICD-10-CM | POA: Diagnosis not present

## 2014-07-28 DIAGNOSIS — Z7901 Long term (current) use of anticoagulants: Secondary | ICD-10-CM | POA: Diagnosis not present

## 2014-07-30 DIAGNOSIS — Z7901 Long term (current) use of anticoagulants: Secondary | ICD-10-CM | POA: Diagnosis not present

## 2014-07-30 DIAGNOSIS — Z8701 Personal history of pneumonia (recurrent): Secondary | ICD-10-CM | POA: Diagnosis not present

## 2014-07-30 DIAGNOSIS — J441 Chronic obstructive pulmonary disease with (acute) exacerbation: Secondary | ICD-10-CM | POA: Diagnosis not present

## 2014-07-30 DIAGNOSIS — I4891 Unspecified atrial fibrillation: Secondary | ICD-10-CM | POA: Diagnosis not present

## 2014-08-03 DIAGNOSIS — Z8701 Personal history of pneumonia (recurrent): Secondary | ICD-10-CM | POA: Diagnosis not present

## 2014-08-03 DIAGNOSIS — I4891 Unspecified atrial fibrillation: Secondary | ICD-10-CM | POA: Diagnosis not present

## 2014-08-03 DIAGNOSIS — J441 Chronic obstructive pulmonary disease with (acute) exacerbation: Secondary | ICD-10-CM | POA: Diagnosis not present

## 2014-08-03 DIAGNOSIS — Z7901 Long term (current) use of anticoagulants: Secondary | ICD-10-CM | POA: Diagnosis not present

## 2014-08-05 DIAGNOSIS — Z09 Encounter for follow-up examination after completed treatment for conditions other than malignant neoplasm: Secondary | ICD-10-CM | POA: Diagnosis not present

## 2014-08-05 DIAGNOSIS — G8922 Chronic post-thoracotomy pain: Secondary | ICD-10-CM | POA: Diagnosis not present

## 2014-08-05 DIAGNOSIS — J439 Emphysema, unspecified: Secondary | ICD-10-CM | POA: Diagnosis not present

## 2014-08-05 DIAGNOSIS — I742 Embolism and thrombosis of arteries of the upper extremities: Secondary | ICD-10-CM | POA: Diagnosis not present

## 2014-08-05 DIAGNOSIS — I10 Essential (primary) hypertension: Secondary | ICD-10-CM | POA: Diagnosis not present

## 2014-08-06 DIAGNOSIS — J441 Chronic obstructive pulmonary disease with (acute) exacerbation: Secondary | ICD-10-CM | POA: Diagnosis not present

## 2014-08-06 DIAGNOSIS — Z8701 Personal history of pneumonia (recurrent): Secondary | ICD-10-CM | POA: Diagnosis not present

## 2014-08-06 DIAGNOSIS — Z7901 Long term (current) use of anticoagulants: Secondary | ICD-10-CM | POA: Diagnosis not present

## 2014-08-06 DIAGNOSIS — I4891 Unspecified atrial fibrillation: Secondary | ICD-10-CM | POA: Diagnosis not present

## 2014-08-10 DIAGNOSIS — J449 Chronic obstructive pulmonary disease, unspecified: Secondary | ICD-10-CM | POA: Diagnosis not present

## 2014-08-18 DIAGNOSIS — I5032 Chronic diastolic (congestive) heart failure: Secondary | ICD-10-CM | POA: Diagnosis not present

## 2014-08-18 DIAGNOSIS — I2510 Atherosclerotic heart disease of native coronary artery without angina pectoris: Secondary | ICD-10-CM | POA: Diagnosis not present

## 2014-08-18 DIAGNOSIS — I481 Persistent atrial fibrillation: Secondary | ICD-10-CM | POA: Diagnosis not present

## 2014-08-19 DIAGNOSIS — I5032 Chronic diastolic (congestive) heart failure: Secondary | ICD-10-CM | POA: Diagnosis not present

## 2014-08-20 DIAGNOSIS — I4891 Unspecified atrial fibrillation: Secondary | ICD-10-CM | POA: Diagnosis not present

## 2014-08-30 ENCOUNTER — Encounter: Payer: Self-pay | Admitting: Pulmonary Disease

## 2014-08-30 ENCOUNTER — Ambulatory Visit (INDEPENDENT_AMBULATORY_CARE_PROVIDER_SITE_OTHER)
Admission: RE | Admit: 2014-08-30 | Discharge: 2014-08-30 | Disposition: A | Discharge status: Home / Self Care | Payer: Medicare Other | Source: Ambulatory Visit | Attending: Pulmonary Disease | Admitting: Pulmonary Disease

## 2014-08-30 ENCOUNTER — Ambulatory Visit (INDEPENDENT_AMBULATORY_CARE_PROVIDER_SITE_OTHER): Payer: Medicare Other | Admitting: Pulmonary Disease

## 2014-08-30 VITALS — BP 122/60 | HR 69 | Temp 97.0°F | Ht 74.0 in | Wt 189.8 lb

## 2014-08-30 DIAGNOSIS — J439 Emphysema, unspecified: Secondary | ICD-10-CM | POA: Diagnosis not present

## 2014-08-30 DIAGNOSIS — J8489 Other specified interstitial pulmonary diseases: Secondary | ICD-10-CM | POA: Diagnosis not present

## 2014-08-30 DIAGNOSIS — J189 Pneumonia, unspecified organism: Secondary | ICD-10-CM | POA: Diagnosis not present

## 2014-08-30 NOTE — Assessment & Plan Note (Signed)
The patient is doing very well from a COPD standpoint on his current inhaler regimen. He is still requiring oxygen both at rest, with exertion, and while sleeping. He is having issues with his atrial fibrillation, and perhaps his oxygen needs will improve once he gets better rate control on a consistent basis. I have stressed to him the importance of conditioning to try and improve his quality of life.

## 2014-08-30 NOTE — Assessment & Plan Note (Signed)
The patient recently had an episode of community-acquired pneumonia around the time that he was having a surgical procedure. He has been treated adequately, and his follow-up x-ray showed improvement but not complete resolution. He is due for a chest x-ray today for final follow-up.

## 2014-08-30 NOTE — Progress Notes (Signed)
   Subjective:    Patient ID: Grant Santos, male    DOB: June 22, 1945, 69 y.o.   MRN: [id number]  HPI The patient comes in today for follow-up of his known COPD. He was recently in the hospital for a surgical procedure, and developed around the same time what is felt to be community-acquired pneumonia. He was treated with appropriate antibiotics, and has been seen in follow-up by her nurse practitioner with an improved chest x-ray. He is due for a follow-up today. The patient feels that he is doing very well from a breathing standpoint. He has very little cough, chest congestion, or mucus. Staying on his bronchodilator regimen.   Review of Systems  Constitutional: Negative for fever, chills, activity change, appetite change and unexpected weight change.  HENT: Positive for congestion and postnasal drip. Negative for dental problem, ear pain, nosebleeds, rhinorrhea, sinus pressure, sneezing, sore throat, trouble swallowing and voice change.   Eyes: Negative for redness, itching and visual disturbance.  Respiratory: Positive for cough, chest tightness and shortness of breath. Negative for choking and wheezing.   Cardiovascular: Negative for chest pain, palpitations and leg swelling.  Gastrointestinal: Negative for nausea, vomiting and abdominal pain.  Genitourinary: Negative for dysuria and difficulty urinating.  Musculoskeletal: Negative for joint swelling and arthralgias.  Skin: Negative for rash.  Neurological: Negative for headaches.  Hematological: Does not bruise/bleed easily.  Psychiatric/Behavioral: Negative for behavioral problems, confusion and dysphoric mood. The patient is not nervous/anxious.        Objective:   Physical Exam Well-developed male in no acute distress Nose without purulence or discharge noted Neck without lymphadenopathy or thyromegaly Chest with mildly decreased breath sounds, otherwise clear Cardiac exam with irregular rhythm, but controlled ventricular  response. Lower extremities without obvious edema. Alert and oriented, moves all 4 extremities.       Assessment & Plan:

## 2014-08-30 NOTE — Patient Instructions (Signed)
Stay on your advair and tudorza daily Continue to wear your oxygen 24/7.  Will check chest xray today, and call you with results. followup with me again in 64mos if doing well.

## 2014-08-31 ENCOUNTER — Other Ambulatory Visit: Payer: Self-pay

## 2014-08-31 ENCOUNTER — Telehealth: Payer: Self-pay | Admitting: Pulmonary Disease

## 2014-08-31 DIAGNOSIS — R000 Tachycardia, unspecified: Secondary | ICD-10-CM | POA: Diagnosis not present

## 2014-08-31 DIAGNOSIS — I10 Essential (primary) hypertension: Secondary | ICD-10-CM | POA: Diagnosis not present

## 2014-08-31 DIAGNOSIS — I4891 Unspecified atrial fibrillation: Secondary | ICD-10-CM | POA: Diagnosis not present

## 2014-08-31 NOTE — Progress Notes (Signed)
Quick Note:  Called and spoke to pt. Informed pt of the results per [facility]. Pt verbalized understanding and denied any further questions or concerns at this time. ______

## 2014-08-31 NOTE — Telephone Encounter (Signed)
Called and spoke to pt. Informed pt of the results per [facility]. Pt verbalized understanding and denied any further questions or concerns at this time.    Notes Recorded by Inge Rise, CMA on 08/31/2014 at 9:38 AM [id number] x1 ------  Notes Recorded by Kathee Delton, MD on 08/30/2014 at 5:36 PM Let pt know that his cxr is better, but he is left with scarring in his lungs as well

## 2014-09-01 ENCOUNTER — Telehealth: Payer: Self-pay

## 2014-09-01 NOTE — Telephone Encounter (Signed)
-----   Message from Conrad Mount Lena, MD sent at 08/31/2014  7:39 PM EDT ----- Regarding: RE: question No problems with pacemaker placement  ----- Message -----    From: Denman George, RN    Sent: 08/31/2014   4:01 PM      To: Conrad Westway, MD Subject: question                                       This pt. is scheduled for a Pacemaker insertion on 09/21/14.  He is scheduled for a 3 mo. f/u on 10/15/14 with chest CTA, and office visit; s/p Left Brachial and Ulnar Thromboembolectomy 07/03/14.  His Cardiologist told him to call the office and check if we would need to do the f/u appts. prior to pacemaker insertion, in case the pacemaker  would interfere with the testing.  Any reason to do this, since it is a CTA and not an MRI?

## 2014-09-10 DIAGNOSIS — J449 Chronic obstructive pulmonary disease, unspecified: Secondary | ICD-10-CM | POA: Diagnosis not present

## 2014-09-13 DIAGNOSIS — R351 Nocturia: Secondary | ICD-10-CM | POA: Diagnosis not present

## 2014-09-13 DIAGNOSIS — N3090 Cystitis, unspecified without hematuria: Secondary | ICD-10-CM | POA: Diagnosis not present

## 2014-09-13 DIAGNOSIS — N401 Enlarged prostate with lower urinary tract symptoms: Secondary | ICD-10-CM | POA: Diagnosis not present

## 2014-09-13 DIAGNOSIS — N318 Other neuromuscular dysfunction of bladder: Secondary | ICD-10-CM | POA: Diagnosis not present

## 2014-09-15 DIAGNOSIS — I10 Essential (primary) hypertension: Secondary | ICD-10-CM | POA: Diagnosis not present

## 2014-09-15 DIAGNOSIS — I5030 Unspecified diastolic (congestive) heart failure: Secondary | ICD-10-CM | POA: Diagnosis not present

## 2014-09-15 DIAGNOSIS — J449 Chronic obstructive pulmonary disease, unspecified: Secondary | ICD-10-CM | POA: Diagnosis not present

## 2014-09-15 DIAGNOSIS — K219 Gastro-esophageal reflux disease without esophagitis: Secondary | ICD-10-CM | POA: Diagnosis not present

## 2014-09-15 DIAGNOSIS — I495 Sick sinus syndrome: Secondary | ICD-10-CM | POA: Diagnosis not present

## 2014-09-15 DIAGNOSIS — Z79899 Other long term (current) drug therapy: Secondary | ICD-10-CM | POA: Diagnosis not present

## 2014-09-15 DIAGNOSIS — J8489 Other specified interstitial pulmonary diseases: Secondary | ICD-10-CM | POA: Diagnosis not present

## 2014-09-21 DIAGNOSIS — Z79899 Other long term (current) drug therapy: Secondary | ICD-10-CM | POA: Diagnosis not present

## 2014-09-21 DIAGNOSIS — K219 Gastro-esophageal reflux disease without esophagitis: Secondary | ICD-10-CM | POA: Diagnosis not present

## 2014-09-21 DIAGNOSIS — R001 Bradycardia, unspecified: Secondary | ICD-10-CM | POA: Diagnosis not present

## 2014-09-21 DIAGNOSIS — I481 Persistent atrial fibrillation: Secondary | ICD-10-CM | POA: Diagnosis not present

## 2014-09-21 DIAGNOSIS — I4891 Unspecified atrial fibrillation: Secondary | ICD-10-CM | POA: Diagnosis not present

## 2014-09-21 DIAGNOSIS — I10 Essential (primary) hypertension: Secondary | ICD-10-CM | POA: Diagnosis not present

## 2014-09-21 DIAGNOSIS — J984 Other disorders of lung: Secondary | ICD-10-CM | POA: Diagnosis not present

## 2014-09-21 DIAGNOSIS — I495 Sick sinus syndrome: Secondary | ICD-10-CM | POA: Diagnosis not present

## 2014-09-21 DIAGNOSIS — Z45018 Encounter for adjustment and management of other part of cardiac pacemaker: Secondary | ICD-10-CM | POA: Diagnosis not present

## 2014-09-21 DIAGNOSIS — I441 Atrioventricular block, second degree: Secondary | ICD-10-CM | POA: Diagnosis not present

## 2014-09-21 DIAGNOSIS — I5030 Unspecified diastolic (congestive) heart failure: Secondary | ICD-10-CM | POA: Diagnosis not present

## 2014-09-21 DIAGNOSIS — J449 Chronic obstructive pulmonary disease, unspecified: Secondary | ICD-10-CM | POA: Diagnosis not present

## 2014-09-21 HISTORY — PX: PACEMAKER INSERTION: SHX728

## 2014-09-22 DIAGNOSIS — Z79899 Other long term (current) drug therapy: Secondary | ICD-10-CM | POA: Diagnosis not present

## 2014-09-22 DIAGNOSIS — K219 Gastro-esophageal reflux disease without esophagitis: Secondary | ICD-10-CM | POA: Diagnosis not present

## 2014-09-22 DIAGNOSIS — I10 Essential (primary) hypertension: Secondary | ICD-10-CM | POA: Diagnosis not present

## 2014-09-22 DIAGNOSIS — Z45010 Encounter for checking and testing of cardiac pacemaker pulse generator [battery]: Secondary | ICD-10-CM | POA: Diagnosis not present

## 2014-09-22 DIAGNOSIS — I5030 Unspecified diastolic (congestive) heart failure: Secondary | ICD-10-CM | POA: Diagnosis not present

## 2014-09-22 DIAGNOSIS — I495 Sick sinus syndrome: Secondary | ICD-10-CM | POA: Diagnosis not present

## 2014-09-22 DIAGNOSIS — J449 Chronic obstructive pulmonary disease, unspecified: Secondary | ICD-10-CM | POA: Diagnosis not present

## 2014-09-27 DIAGNOSIS — G894 Chronic pain syndrome: Secondary | ICD-10-CM | POA: Diagnosis not present

## 2014-09-27 DIAGNOSIS — M47816 Spondylosis without myelopathy or radiculopathy, lumbar region: Secondary | ICD-10-CM | POA: Diagnosis not present

## 2014-09-27 DIAGNOSIS — Z79891 Long term (current) use of opiate analgesic: Secondary | ICD-10-CM | POA: Diagnosis not present

## 2014-09-27 DIAGNOSIS — G8922 Chronic post-thoracotomy pain: Secondary | ICD-10-CM | POA: Diagnosis not present

## 2014-10-01 DIAGNOSIS — I498 Other specified cardiac arrhythmias: Secondary | ICD-10-CM | POA: Diagnosis not present

## 2014-10-01 DIAGNOSIS — Z45010 Encounter for checking and testing of cardiac pacemaker pulse generator [battery]: Secondary | ICD-10-CM | POA: Diagnosis not present

## 2014-10-01 DIAGNOSIS — E785 Hyperlipidemia, unspecified: Secondary | ICD-10-CM | POA: Diagnosis not present

## 2014-10-01 DIAGNOSIS — I10 Essential (primary) hypertension: Secondary | ICD-10-CM | POA: Diagnosis not present

## 2014-10-10 DIAGNOSIS — J449 Chronic obstructive pulmonary disease, unspecified: Secondary | ICD-10-CM | POA: Diagnosis not present

## 2014-10-11 ENCOUNTER — Other Ambulatory Visit: Payer: Self-pay

## 2014-10-11 DIAGNOSIS — I712 Thoracic aortic aneurysm, without rupture, unspecified: Secondary | ICD-10-CM

## 2014-10-11 DIAGNOSIS — Z01812 Encounter for preprocedural laboratory examination: Secondary | ICD-10-CM

## 2014-10-11 LAB — CREATININE, SERUM: Creat: 0.93 mg/dL (ref 0.50–1.35)

## 2014-10-11 LAB — BUN: BUN: 12 mg/dL (ref 6–23)

## 2014-10-12 ENCOUNTER — Ambulatory Visit
Admission: RE | Admit: 2014-10-12 | Discharge: 2014-10-12 | Disposition: A | Discharge status: Home / Self Care | Payer: Medicare Other | Source: Ambulatory Visit | Attending: Vascular Surgery | Admitting: Vascular Surgery

## 2014-10-12 DIAGNOSIS — J8410 Pulmonary fibrosis, unspecified: Secondary | ICD-10-CM | POA: Diagnosis not present

## 2014-10-12 DIAGNOSIS — I712 Thoracic aortic aneurysm, without rupture: Secondary | ICD-10-CM | POA: Diagnosis not present

## 2014-10-12 DIAGNOSIS — I719 Aortic aneurysm of unspecified site, without rupture: Secondary | ICD-10-CM

## 2014-10-12 DIAGNOSIS — I82622 Acute embolism and thrombosis of deep veins of left upper extremity: Secondary | ICD-10-CM

## 2014-10-12 DIAGNOSIS — I2510 Atherosclerotic heart disease of native coronary artery without angina pectoris: Secondary | ICD-10-CM | POA: Diagnosis not present

## 2014-10-12 DIAGNOSIS — J432 Centrilobular emphysema: Secondary | ICD-10-CM | POA: Diagnosis not present

## 2014-10-12 MED ORDER — IOPAMIDOL (ISOVUE-370) INJECTION 76%
75.0000 mL | Freq: Once | INTRAVENOUS | Status: AC | PRN
  Administered 2014-10-12: 75 mL via INTRAVENOUS

## 2014-10-14 DIAGNOSIS — N401 Enlarged prostate with lower urinary tract symptoms: Secondary | ICD-10-CM | POA: Diagnosis not present

## 2014-10-14 DIAGNOSIS — N3090 Cystitis, unspecified without hematuria: Secondary | ICD-10-CM | POA: Diagnosis not present

## 2014-10-14 DIAGNOSIS — R3912 Poor urinary stream: Secondary | ICD-10-CM | POA: Diagnosis not present

## 2014-10-14 DIAGNOSIS — R351 Nocturia: Secondary | ICD-10-CM | POA: Diagnosis not present

## 2014-10-14 DIAGNOSIS — R3916 Straining to void: Secondary | ICD-10-CM | POA: Diagnosis not present

## 2014-10-14 DIAGNOSIS — N318 Other neuromuscular dysfunction of bladder: Secondary | ICD-10-CM | POA: Diagnosis not present

## 2014-10-17 ENCOUNTER — Other Ambulatory Visit: Payer: Self-pay | Admitting: Pulmonary Disease

## 2014-10-21 ENCOUNTER — Encounter: Payer: Self-pay | Admitting: Vascular Surgery

## 2014-10-22 ENCOUNTER — Encounter: Payer: Self-pay | Admitting: Vascular Surgery

## 2014-10-22 ENCOUNTER — Ambulatory Visit (INDEPENDENT_AMBULATORY_CARE_PROVIDER_SITE_OTHER): Payer: Self-pay | Admitting: Vascular Surgery

## 2014-10-22 VITALS — BP 99/55 | HR 64 | Ht 74.0 in | Wt 196.0 lb

## 2014-10-22 DIAGNOSIS — T82390D Other mechanical complication of aortic (bifurcation) graft (replacement), subsequent encounter: Secondary | ICD-10-CM

## 2014-10-22 NOTE — Progress Notes (Signed)
    Postoperative Visit   History of Present Illness  Grant Santos is a 69 y.o. male  who presents for postoperative follow-up for: CTA Chest.  The patient has a history of open L thoracic aneurysm repair.  He has known PSA of thoracic aorta.  Attempts have been made to obtain his [place] records without any successful.  The patient denies any chest pain.  He continues to be oxygen dependent as an outpatient.    Prior procedures: L brachial and ulnar artery thromboembolectomy (Date: 07/03/14). The patient's current symptoms are: fatigue and difficulty with breathing requiring home oxygen.  For VQI Use Only  PRE-ADM LIVING: Home  AMB STATUS: Wheelchair  Physical Examination  Filed Vitals:   10/22/14 1239  BP: 99/55  Pulse: 64  Height: 6\' 2"  (1.88 m)  Weight: 196 lb (88.905 kg)  SpO2: 97%   PULM: Distal BS, fines rales, no rhonchi, healed L thoracotomy incision  LUE: Incision is healed, palpable L ulnar pulse  CTA Chest (10/12/14)  1. 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 Given history of left upper extremity thromboembolism earlier this year, patient may be at high risk for repeat distal embolization. 2. Atherosclerosis including coronary artery disease.  NON VASCULAR  1. Interval resolution of airspace disease seen on the prior CT from January 2016. 2. Background of pulmonary fibrosis and combined centrilobular and paraseptal emphysema. 3. Cardiomegaly with biatrial enlargement. 4. 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 Benign right-sided pulmonary nodules remain unchanged dating back to October of 2012.  Based on my review of this patient's CTA, I see no evidence of active  contrast extravasation into what appears to be a chronically thrombosed PSA at what is likely the proximal suture line of this patient's thoracic aortic repair.  The linearity visualized is DISTAL to the left subclavian artery, so I don't think it was the etiology for the thromboembolism to the left    Medical Decision Making  Grant Santos is a 69 y.o. male  who presents s/p L brachial and ulnar TE, known thoracic aortic PSA, decompensated COPD, s/p prior thoracic aortic repair .  Again, patient is not medically stable, so nothing will be done from a surgical viewpoint currently.  PSA appears to be stable from prior scans.  I would continue with anticoagulation with Eliquis.  We discussed repeating the CTA chest in one year to see if there are any changes.  Thank you for allowing [place] to participate in this patient's care.  Adele Barthel, MD [facility] [place] Office: [phone] Pager: [phone]

## 2014-10-25 ENCOUNTER — Other Ambulatory Visit: Payer: Self-pay | Admitting: *Deleted

## 2014-10-25 DIAGNOSIS — M47812 Spondylosis without myelopathy or radiculopathy, cervical region: Secondary | ICD-10-CM | POA: Diagnosis not present

## 2014-10-25 DIAGNOSIS — R51 Headache: Secondary | ICD-10-CM | POA: Diagnosis not present

## 2014-10-25 DIAGNOSIS — Z6825 Body mass index (BMI) 25.0-25.9, adult: Secondary | ICD-10-CM | POA: Diagnosis not present

## 2014-10-25 DIAGNOSIS — I729 Aneurysm of unspecified site: Secondary | ICD-10-CM

## 2014-10-25 DIAGNOSIS — G250 Essential tremor: Secondary | ICD-10-CM | POA: Diagnosis not present

## 2014-10-27 DIAGNOSIS — Z79891 Long term (current) use of opiate analgesic: Secondary | ICD-10-CM | POA: Diagnosis not present

## 2014-11-09 ENCOUNTER — Ambulatory Visit (INDEPENDENT_AMBULATORY_CARE_PROVIDER_SITE_OTHER): Payer: Medicare Other | Admitting: Pulmonary Disease

## 2014-11-09 ENCOUNTER — Ambulatory Visit (INDEPENDENT_AMBULATORY_CARE_PROVIDER_SITE_OTHER)
Admission: RE | Admit: 2014-11-09 | Discharge: 2014-11-09 | Disposition: A | Discharge status: Home / Self Care | Payer: Medicare Other | Source: Ambulatory Visit | Attending: Pulmonary Disease | Admitting: Pulmonary Disease

## 2014-11-09 ENCOUNTER — Encounter: Payer: Self-pay | Admitting: Pulmonary Disease

## 2014-11-09 VITALS — BP 118/60 | HR 75 | Temp 97.0°F | Ht 75.0 in | Wt 207.2 lb

## 2014-11-09 DIAGNOSIS — J438 Other emphysema: Secondary | ICD-10-CM | POA: Diagnosis not present

## 2014-11-09 DIAGNOSIS — J441 Chronic obstructive pulmonary disease with (acute) exacerbation: Secondary | ICD-10-CM | POA: Diagnosis not present

## 2014-11-09 DIAGNOSIS — J8489 Other specified interstitial pulmonary diseases: Secondary | ICD-10-CM | POA: Diagnosis not present

## 2014-11-09 DIAGNOSIS — J449 Chronic obstructive pulmonary disease, unspecified: Secondary | ICD-10-CM | POA: Diagnosis not present

## 2014-11-09 NOTE — Patient Instructions (Signed)
Will check a chest xray today, and call you with results. Let me know if you are not returning to your baseline or if you are getting worse. Please have the scheduler make your upcoming followup apptm with Dr. Lake Bells.

## 2014-11-09 NOTE — Progress Notes (Signed)
   Subjective:    Patient ID: Grant Santos, male    DOB: [birth year], 69 y.o.   MRN: [id number]  HPI The patient comes in today for an acute sick visit. He has known COPD and some lower lobe interstitial disease, and was in his usual state of health until this weekend. He developed what sounds like an acute viral illness with nausea and significant vomiting, and the following day he had issues with increasing shortness of breath and falling oxygen saturations. He did cough up some mucus which may have been blood-tinged, but should be noted that he is on chronic anticoagulation. He has felt better each day since then, and currently is without chest congestion or purulent mucus. He'll feels that his shortness of breath has not returned to baseline, and his oxygen saturations are less than usual.   Review of Systems  Constitutional: Negative for fever and unexpected weight change.  HENT: Negative for congestion, dental problem, ear pain, nosebleeds, postnasal drip, rhinorrhea, sinus pressure, sneezing, sore throat and trouble swallowing.   Eyes: Negative for redness and itching.  Respiratory: Positive for cough and shortness of breath. Negative for chest tightness and wheezing.   Cardiovascular: Negative for palpitations and leg swelling.  Gastrointestinal: Negative for nausea and vomiting.  Genitourinary: Negative for dysuria.  Musculoskeletal: Negative for joint swelling.  Skin: Negative for rash.  Neurological: Negative for headaches.  Hematological: Does not bruise/bleed easily.  Psychiatric/Behavioral: Negative for dysphoric mood. The patient is not nervous/anxious.        Objective:   Physical Exam Well-developed male in no acute distress Nose without purulence or discharge noted Neck without lymphadenopathy or thyromegaly Chest with crackles in both bases, but no wheezing and adequate airflow Cardiac exam with regular rate and rhythm Lower extremity with mild edema on the right,  prosthesis on the left. Alert and oriented, moves all 4 extremities.       Assessment & Plan:

## 2014-11-09 NOTE — Assessment & Plan Note (Signed)
The patient had an episode over the weekend of an acute GI illness with significant vomiting, and then began to have issues with some increased shortness of breath and cough with what sounds like blood-tinged mucus. He also had issues with falling oxygen saturations, but they were adequate when wearing his usual 3 L. He is now improved, but not yet back to baseline. I am wondering if he had an aspiration pneumonitis, and will check a chest x-ray today for completeness. He has no bronchospasm on exam, and I do not think this is related to thromboembolic disease since he has been on chronic anticoagulation. Hopefully he will get fully back to baseline over the next few days.

## 2014-11-10 ENCOUNTER — Other Ambulatory Visit: Payer: Self-pay | Admitting: Pulmonary Disease

## 2014-11-10 DIAGNOSIS — Z89612 Acquired absence of left leg above knee: Secondary | ICD-10-CM

## 2014-11-10 DIAGNOSIS — Z9981 Dependence on supplemental oxygen: Secondary | ICD-10-CM

## 2014-11-10 DIAGNOSIS — I110 Hypertensive heart disease with heart failure: Secondary | ICD-10-CM | POA: Insufficient documentation

## 2014-11-10 DIAGNOSIS — J449 Chronic obstructive pulmonary disease, unspecified: Secondary | ICD-10-CM | POA: Diagnosis not present

## 2014-11-10 DIAGNOSIS — Z950 Presence of cardiac pacemaker: Secondary | ICD-10-CM | POA: Insufficient documentation

## 2014-11-10 HISTORY — DX: Presence of cardiac pacemaker: Z95.0

## 2014-11-10 HISTORY — DX: Dependence on supplemental oxygen: Z99.81

## 2014-11-10 HISTORY — DX: Acquired absence of left leg above knee: Z89.612

## 2014-11-10 MED ORDER — AMOXICILLIN-POT CLAVULANATE 875-125 MG PO TABS: 1.0000 | ORAL_TABLET | Freq: Two times a day (BID) | ORAL | Status: DC

## 2014-11-11 DIAGNOSIS — Z950 Presence of cardiac pacemaker: Secondary | ICD-10-CM | POA: Diagnosis not present

## 2014-11-11 DIAGNOSIS — I2510 Atherosclerotic heart disease of native coronary artery without angina pectoris: Secondary | ICD-10-CM | POA: Diagnosis not present

## 2014-11-11 DIAGNOSIS — I10 Essential (primary) hypertension: Secondary | ICD-10-CM | POA: Diagnosis not present

## 2014-11-11 DIAGNOSIS — I455 Other specified heart block: Secondary | ICD-10-CM | POA: Diagnosis not present

## 2014-11-11 DIAGNOSIS — G4730 Sleep apnea, unspecified: Secondary | ICD-10-CM | POA: Diagnosis not present

## 2014-11-11 DIAGNOSIS — I509 Heart failure, unspecified: Secondary | ICD-10-CM | POA: Diagnosis not present

## 2014-11-11 DIAGNOSIS — J449 Chronic obstructive pulmonary disease, unspecified: Secondary | ICD-10-CM | POA: Diagnosis not present

## 2014-11-11 DIAGNOSIS — I4891 Unspecified atrial fibrillation: Secondary | ICD-10-CM | POA: Diagnosis not present

## 2014-11-11 DIAGNOSIS — I495 Sick sinus syndrome: Secondary | ICD-10-CM | POA: Diagnosis not present

## 2014-11-25 DIAGNOSIS — E559 Vitamin D deficiency, unspecified: Secondary | ICD-10-CM | POA: Diagnosis not present

## 2014-11-25 DIAGNOSIS — J309 Allergic rhinitis, unspecified: Secondary | ICD-10-CM | POA: Diagnosis not present

## 2014-11-25 DIAGNOSIS — Z1212 Encounter for screening for malignant neoplasm of rectum: Secondary | ICD-10-CM | POA: Diagnosis not present

## 2014-11-25 DIAGNOSIS — E782 Mixed hyperlipidemia: Secondary | ICD-10-CM | POA: Diagnosis not present

## 2014-11-25 DIAGNOSIS — Z23 Encounter for immunization: Secondary | ICD-10-CM | POA: Diagnosis not present

## 2014-11-25 DIAGNOSIS — Z125 Encounter for screening for malignant neoplasm of prostate: Secondary | ICD-10-CM | POA: Diagnosis not present

## 2014-11-25 DIAGNOSIS — Z79899 Other long term (current) drug therapy: Secondary | ICD-10-CM | POA: Diagnosis not present

## 2014-11-25 DIAGNOSIS — Z0000 Encounter for general adult medical examination without abnormal findings: Secondary | ICD-10-CM | POA: Diagnosis not present

## 2014-11-29 DIAGNOSIS — M47816 Spondylosis without myelopathy or radiculopathy, lumbar region: Secondary | ICD-10-CM | POA: Diagnosis not present

## 2014-11-29 DIAGNOSIS — M4722 Other spondylosis with radiculopathy, cervical region: Secondary | ICD-10-CM | POA: Diagnosis not present

## 2014-11-29 DIAGNOSIS — Z79891 Long term (current) use of opiate analgesic: Secondary | ICD-10-CM | POA: Diagnosis not present

## 2014-11-29 DIAGNOSIS — G8922 Chronic post-thoracotomy pain: Secondary | ICD-10-CM | POA: Diagnosis not present

## 2014-12-03 DIAGNOSIS — M4722 Other spondylosis with radiculopathy, cervical region: Secondary | ICD-10-CM | POA: Diagnosis not present

## 2014-12-03 DIAGNOSIS — M25611 Stiffness of right shoulder, not elsewhere classified: Secondary | ICD-10-CM | POA: Diagnosis not present

## 2014-12-03 DIAGNOSIS — R293 Abnormal posture: Secondary | ICD-10-CM | POA: Diagnosis not present

## 2014-12-03 DIAGNOSIS — M6281 Muscle weakness (generalized): Secondary | ICD-10-CM | POA: Diagnosis not present

## 2014-12-03 DIAGNOSIS — M25612 Stiffness of left shoulder, not elsewhere classified: Secondary | ICD-10-CM | POA: Diagnosis not present

## 2014-12-03 DIAGNOSIS — M25512 Pain in left shoulder: Secondary | ICD-10-CM | POA: Diagnosis not present

## 2014-12-03 DIAGNOSIS — M542 Cervicalgia: Secondary | ICD-10-CM | POA: Diagnosis not present

## 2014-12-03 DIAGNOSIS — R51 Headache: Secondary | ICD-10-CM | POA: Diagnosis not present

## 2014-12-03 DIAGNOSIS — M2560 Stiffness of unspecified joint, not elsewhere classified: Secondary | ICD-10-CM | POA: Diagnosis not present

## 2014-12-08 DIAGNOSIS — R51 Headache: Secondary | ICD-10-CM | POA: Diagnosis not present

## 2014-12-08 DIAGNOSIS — M6281 Muscle weakness (generalized): Secondary | ICD-10-CM | POA: Diagnosis not present

## 2014-12-08 DIAGNOSIS — M542 Cervicalgia: Secondary | ICD-10-CM | POA: Diagnosis not present

## 2014-12-08 DIAGNOSIS — M4722 Other spondylosis with radiculopathy, cervical region: Secondary | ICD-10-CM | POA: Diagnosis not present

## 2014-12-08 DIAGNOSIS — M25612 Stiffness of left shoulder, not elsewhere classified: Secondary | ICD-10-CM | POA: Diagnosis not present

## 2014-12-08 DIAGNOSIS — M25512 Pain in left shoulder: Secondary | ICD-10-CM | POA: Diagnosis not present

## 2014-12-08 DIAGNOSIS — M2560 Stiffness of unspecified joint, not elsewhere classified: Secondary | ICD-10-CM | POA: Diagnosis not present

## 2014-12-08 DIAGNOSIS — R293 Abnormal posture: Secondary | ICD-10-CM | POA: Diagnosis not present

## 2014-12-08 DIAGNOSIS — M25611 Stiffness of right shoulder, not elsewhere classified: Secondary | ICD-10-CM | POA: Diagnosis not present

## 2014-12-10 DIAGNOSIS — M25612 Stiffness of left shoulder, not elsewhere classified: Secondary | ICD-10-CM | POA: Diagnosis not present

## 2014-12-10 DIAGNOSIS — M25512 Pain in left shoulder: Secondary | ICD-10-CM | POA: Diagnosis not present

## 2014-12-10 DIAGNOSIS — M25611 Stiffness of right shoulder, not elsewhere classified: Secondary | ICD-10-CM | POA: Diagnosis not present

## 2014-12-10 DIAGNOSIS — M542 Cervicalgia: Secondary | ICD-10-CM | POA: Diagnosis not present

## 2014-12-10 DIAGNOSIS — J449 Chronic obstructive pulmonary disease, unspecified: Secondary | ICD-10-CM | POA: Diagnosis not present

## 2014-12-10 DIAGNOSIS — M4722 Other spondylosis with radiculopathy, cervical region: Secondary | ICD-10-CM | POA: Diagnosis not present

## 2014-12-10 DIAGNOSIS — M6281 Muscle weakness (generalized): Secondary | ICD-10-CM | POA: Diagnosis not present

## 2014-12-10 DIAGNOSIS — R51 Headache: Secondary | ICD-10-CM | POA: Diagnosis not present

## 2014-12-10 DIAGNOSIS — M2560 Stiffness of unspecified joint, not elsewhere classified: Secondary | ICD-10-CM | POA: Diagnosis not present

## 2014-12-10 DIAGNOSIS — R293 Abnormal posture: Secondary | ICD-10-CM | POA: Diagnosis not present

## 2014-12-15 DIAGNOSIS — M6281 Muscle weakness (generalized): Secondary | ICD-10-CM | POA: Diagnosis not present

## 2014-12-15 DIAGNOSIS — M4722 Other spondylosis with radiculopathy, cervical region: Secondary | ICD-10-CM | POA: Diagnosis not present

## 2014-12-15 DIAGNOSIS — R51 Headache: Secondary | ICD-10-CM | POA: Diagnosis not present

## 2014-12-15 DIAGNOSIS — M25612 Stiffness of left shoulder, not elsewhere classified: Secondary | ICD-10-CM | POA: Diagnosis not present

## 2014-12-15 DIAGNOSIS — M542 Cervicalgia: Secondary | ICD-10-CM | POA: Diagnosis not present

## 2014-12-15 DIAGNOSIS — M2560 Stiffness of unspecified joint, not elsewhere classified: Secondary | ICD-10-CM | POA: Diagnosis not present

## 2014-12-15 DIAGNOSIS — R293 Abnormal posture: Secondary | ICD-10-CM | POA: Diagnosis not present

## 2014-12-15 DIAGNOSIS — M25512 Pain in left shoulder: Secondary | ICD-10-CM | POA: Diagnosis not present

## 2014-12-15 DIAGNOSIS — M25611 Stiffness of right shoulder, not elsewhere classified: Secondary | ICD-10-CM | POA: Diagnosis not present

## 2014-12-17 DIAGNOSIS — M25611 Stiffness of right shoulder, not elsewhere classified: Secondary | ICD-10-CM | POA: Diagnosis not present

## 2014-12-17 DIAGNOSIS — Z79891 Long term (current) use of opiate analgesic: Secondary | ICD-10-CM | POA: Diagnosis not present

## 2014-12-17 DIAGNOSIS — M6281 Muscle weakness (generalized): Secondary | ICD-10-CM | POA: Diagnosis not present

## 2014-12-17 DIAGNOSIS — M25612 Stiffness of left shoulder, not elsewhere classified: Secondary | ICD-10-CM | POA: Diagnosis not present

## 2014-12-17 DIAGNOSIS — R51 Headache: Secondary | ICD-10-CM | POA: Diagnosis not present

## 2014-12-17 DIAGNOSIS — G8922 Chronic post-thoracotomy pain: Secondary | ICD-10-CM | POA: Diagnosis not present

## 2014-12-17 DIAGNOSIS — M2560 Stiffness of unspecified joint, not elsewhere classified: Secondary | ICD-10-CM | POA: Diagnosis not present

## 2014-12-17 DIAGNOSIS — M542 Cervicalgia: Secondary | ICD-10-CM | POA: Diagnosis not present

## 2014-12-17 DIAGNOSIS — R293 Abnormal posture: Secondary | ICD-10-CM | POA: Diagnosis not present

## 2014-12-17 DIAGNOSIS — M25512 Pain in left shoulder: Secondary | ICD-10-CM | POA: Diagnosis not present

## 2014-12-17 DIAGNOSIS — M47816 Spondylosis without myelopathy or radiculopathy, lumbar region: Secondary | ICD-10-CM | POA: Diagnosis not present

## 2014-12-17 DIAGNOSIS — M4722 Other spondylosis with radiculopathy, cervical region: Secondary | ICD-10-CM | POA: Diagnosis not present

## 2014-12-20 DIAGNOSIS — Z950 Presence of cardiac pacemaker: Secondary | ICD-10-CM | POA: Diagnosis not present

## 2014-12-20 DIAGNOSIS — I495 Sick sinus syndrome: Secondary | ICD-10-CM

## 2014-12-20 DIAGNOSIS — I5032 Chronic diastolic (congestive) heart failure: Secondary | ICD-10-CM

## 2014-12-20 DIAGNOSIS — I2510 Atherosclerotic heart disease of native coronary artery without angina pectoris: Secondary | ICD-10-CM | POA: Diagnosis not present

## 2014-12-20 DIAGNOSIS — Z8679 Personal history of other diseases of the circulatory system: Secondary | ICD-10-CM

## 2014-12-20 DIAGNOSIS — I10 Essential (primary) hypertension: Secondary | ICD-10-CM | POA: Diagnosis not present

## 2014-12-20 DIAGNOSIS — I481 Persistent atrial fibrillation: Secondary | ICD-10-CM | POA: Diagnosis not present

## 2014-12-20 DIAGNOSIS — Z951 Presence of aortocoronary bypass graft: Secondary | ICD-10-CM | POA: Insufficient documentation

## 2014-12-20 HISTORY — DX: Personal history of other diseases of the circulatory system: Z86.79

## 2014-12-20 HISTORY — DX: Chronic diastolic (congestive) heart failure: I50.32

## 2014-12-20 HISTORY — DX: Sick sinus syndrome: I49.5

## 2014-12-20 HISTORY — DX: Presence of aortocoronary bypass graft: Z95.1

## 2014-12-21 DIAGNOSIS — G8922 Chronic post-thoracotomy pain: Secondary | ICD-10-CM | POA: Diagnosis not present

## 2014-12-21 DIAGNOSIS — M47816 Spondylosis without myelopathy or radiculopathy, lumbar region: Secondary | ICD-10-CM | POA: Diagnosis not present

## 2014-12-21 DIAGNOSIS — Z79891 Long term (current) use of opiate analgesic: Secondary | ICD-10-CM | POA: Diagnosis not present

## 2014-12-21 DIAGNOSIS — M4722 Other spondylosis with radiculopathy, cervical region: Secondary | ICD-10-CM | POA: Diagnosis not present

## 2014-12-22 ENCOUNTER — Other Ambulatory Visit: Payer: Self-pay | Admitting: *Deleted

## 2014-12-22 DIAGNOSIS — M6281 Muscle weakness (generalized): Secondary | ICD-10-CM | POA: Diagnosis not present

## 2014-12-22 DIAGNOSIS — M25611 Stiffness of right shoulder, not elsewhere classified: Secondary | ICD-10-CM | POA: Diagnosis not present

## 2014-12-22 DIAGNOSIS — M4722 Other spondylosis with radiculopathy, cervical region: Secondary | ICD-10-CM | POA: Diagnosis not present

## 2014-12-22 DIAGNOSIS — M542 Cervicalgia: Secondary | ICD-10-CM | POA: Diagnosis not present

## 2014-12-22 DIAGNOSIS — M2560 Stiffness of unspecified joint, not elsewhere classified: Secondary | ICD-10-CM | POA: Diagnosis not present

## 2014-12-22 DIAGNOSIS — R51 Headache: Secondary | ICD-10-CM | POA: Diagnosis not present

## 2014-12-22 DIAGNOSIS — M25612 Stiffness of left shoulder, not elsewhere classified: Secondary | ICD-10-CM | POA: Diagnosis not present

## 2014-12-22 DIAGNOSIS — M25512 Pain in left shoulder: Secondary | ICD-10-CM | POA: Diagnosis not present

## 2014-12-22 DIAGNOSIS — R293 Abnormal posture: Secondary | ICD-10-CM | POA: Diagnosis not present

## 2014-12-22 MED ORDER — TUDORZA PRESSAIR 400 MCG/ACT IN AEPB: 1.0000 | INHALATION_SPRAY | Freq: Two times a day (BID) | RESPIRATORY_TRACT | Status: AC

## 2014-12-24 DIAGNOSIS — M25611 Stiffness of right shoulder, not elsewhere classified: Secondary | ICD-10-CM | POA: Diagnosis not present

## 2014-12-24 DIAGNOSIS — M6281 Muscle weakness (generalized): Secondary | ICD-10-CM | POA: Diagnosis not present

## 2014-12-24 DIAGNOSIS — R293 Abnormal posture: Secondary | ICD-10-CM | POA: Diagnosis not present

## 2014-12-24 DIAGNOSIS — M25612 Stiffness of left shoulder, not elsewhere classified: Secondary | ICD-10-CM | POA: Diagnosis not present

## 2014-12-24 DIAGNOSIS — R51 Headache: Secondary | ICD-10-CM | POA: Diagnosis not present

## 2014-12-24 DIAGNOSIS — M25512 Pain in left shoulder: Secondary | ICD-10-CM | POA: Diagnosis not present

## 2014-12-24 DIAGNOSIS — M542 Cervicalgia: Secondary | ICD-10-CM | POA: Diagnosis not present

## 2014-12-24 DIAGNOSIS — M4722 Other spondylosis with radiculopathy, cervical region: Secondary | ICD-10-CM | POA: Diagnosis not present

## 2014-12-24 DIAGNOSIS — M2560 Stiffness of unspecified joint, not elsewhere classified: Secondary | ICD-10-CM | POA: Diagnosis not present

## 2014-12-29 DIAGNOSIS — M542 Cervicalgia: Secondary | ICD-10-CM | POA: Diagnosis not present

## 2014-12-29 DIAGNOSIS — M25612 Stiffness of left shoulder, not elsewhere classified: Secondary | ICD-10-CM | POA: Diagnosis not present

## 2014-12-29 DIAGNOSIS — Z79891 Long term (current) use of opiate analgesic: Secondary | ICD-10-CM | POA: Diagnosis not present

## 2014-12-29 DIAGNOSIS — M791 Myalgia: Secondary | ICD-10-CM | POA: Diagnosis not present

## 2014-12-29 DIAGNOSIS — M25512 Pain in left shoulder: Secondary | ICD-10-CM | POA: Diagnosis not present

## 2014-12-29 DIAGNOSIS — R293 Abnormal posture: Secondary | ICD-10-CM | POA: Diagnosis not present

## 2014-12-29 DIAGNOSIS — M4722 Other spondylosis with radiculopathy, cervical region: Secondary | ICD-10-CM | POA: Diagnosis not present

## 2014-12-29 DIAGNOSIS — M6281 Muscle weakness (generalized): Secondary | ICD-10-CM | POA: Diagnosis not present

## 2014-12-29 DIAGNOSIS — R51 Headache: Secondary | ICD-10-CM | POA: Diagnosis not present

## 2014-12-29 DIAGNOSIS — M25611 Stiffness of right shoulder, not elsewhere classified: Secondary | ICD-10-CM | POA: Diagnosis not present

## 2014-12-29 DIAGNOSIS — M2560 Stiffness of unspecified joint, not elsewhere classified: Secondary | ICD-10-CM | POA: Diagnosis not present

## 2014-12-29 DIAGNOSIS — G894 Chronic pain syndrome: Secondary | ICD-10-CM | POA: Diagnosis not present

## 2014-12-31 DIAGNOSIS — M6281 Muscle weakness (generalized): Secondary | ICD-10-CM | POA: Diagnosis not present

## 2014-12-31 DIAGNOSIS — R293 Abnormal posture: Secondary | ICD-10-CM | POA: Diagnosis not present

## 2014-12-31 DIAGNOSIS — M4722 Other spondylosis with radiculopathy, cervical region: Secondary | ICD-10-CM | POA: Diagnosis not present

## 2014-12-31 DIAGNOSIS — M25512 Pain in left shoulder: Secondary | ICD-10-CM | POA: Diagnosis not present

## 2014-12-31 DIAGNOSIS — M542 Cervicalgia: Secondary | ICD-10-CM | POA: Diagnosis not present

## 2014-12-31 DIAGNOSIS — M25612 Stiffness of left shoulder, not elsewhere classified: Secondary | ICD-10-CM | POA: Diagnosis not present

## 2014-12-31 DIAGNOSIS — M25611 Stiffness of right shoulder, not elsewhere classified: Secondary | ICD-10-CM | POA: Diagnosis not present

## 2014-12-31 DIAGNOSIS — M2560 Stiffness of unspecified joint, not elsewhere classified: Secondary | ICD-10-CM | POA: Diagnosis not present

## 2014-12-31 DIAGNOSIS — R51 Headache: Secondary | ICD-10-CM | POA: Diagnosis not present

## 2015-01-05 DIAGNOSIS — M25512 Pain in left shoulder: Secondary | ICD-10-CM | POA: Diagnosis not present

## 2015-01-05 DIAGNOSIS — M6281 Muscle weakness (generalized): Secondary | ICD-10-CM | POA: Diagnosis not present

## 2015-01-05 DIAGNOSIS — M25612 Stiffness of left shoulder, not elsewhere classified: Secondary | ICD-10-CM | POA: Diagnosis not present

## 2015-01-05 DIAGNOSIS — M4722 Other spondylosis with radiculopathy, cervical region: Secondary | ICD-10-CM | POA: Diagnosis not present

## 2015-01-05 DIAGNOSIS — R293 Abnormal posture: Secondary | ICD-10-CM | POA: Diagnosis not present

## 2015-01-05 DIAGNOSIS — R51 Headache: Secondary | ICD-10-CM | POA: Diagnosis not present

## 2015-01-05 DIAGNOSIS — M25611 Stiffness of right shoulder, not elsewhere classified: Secondary | ICD-10-CM | POA: Diagnosis not present

## 2015-01-05 DIAGNOSIS — M2560 Stiffness of unspecified joint, not elsewhere classified: Secondary | ICD-10-CM | POA: Diagnosis not present

## 2015-01-05 DIAGNOSIS — M542 Cervicalgia: Secondary | ICD-10-CM | POA: Diagnosis not present

## 2015-01-06 DIAGNOSIS — M791 Myalgia: Secondary | ICD-10-CM | POA: Diagnosis not present

## 2015-01-06 DIAGNOSIS — G894 Chronic pain syndrome: Secondary | ICD-10-CM | POA: Diagnosis not present

## 2015-01-06 DIAGNOSIS — Z79891 Long term (current) use of opiate analgesic: Secondary | ICD-10-CM | POA: Diagnosis not present

## 2015-01-06 DIAGNOSIS — M4722 Other spondylosis with radiculopathy, cervical region: Secondary | ICD-10-CM | POA: Diagnosis not present

## 2015-01-07 DIAGNOSIS — R293 Abnormal posture: Secondary | ICD-10-CM | POA: Diagnosis not present

## 2015-01-07 DIAGNOSIS — M4722 Other spondylosis with radiculopathy, cervical region: Secondary | ICD-10-CM | POA: Diagnosis not present

## 2015-01-07 DIAGNOSIS — R51 Headache: Secondary | ICD-10-CM | POA: Diagnosis not present

## 2015-01-07 DIAGNOSIS — M25611 Stiffness of right shoulder, not elsewhere classified: Secondary | ICD-10-CM | POA: Diagnosis not present

## 2015-01-07 DIAGNOSIS — M25612 Stiffness of left shoulder, not elsewhere classified: Secondary | ICD-10-CM | POA: Diagnosis not present

## 2015-01-07 DIAGNOSIS — M25512 Pain in left shoulder: Secondary | ICD-10-CM | POA: Diagnosis not present

## 2015-01-07 DIAGNOSIS — M6281 Muscle weakness (generalized): Secondary | ICD-10-CM | POA: Diagnosis not present

## 2015-01-07 DIAGNOSIS — M2560 Stiffness of unspecified joint, not elsewhere classified: Secondary | ICD-10-CM | POA: Diagnosis not present

## 2015-01-07 DIAGNOSIS — M542 Cervicalgia: Secondary | ICD-10-CM | POA: Diagnosis not present

## 2015-01-10 DIAGNOSIS — J449 Chronic obstructive pulmonary disease, unspecified: Secondary | ICD-10-CM | POA: Diagnosis not present

## 2015-01-11 DIAGNOSIS — R51 Headache: Secondary | ICD-10-CM | POA: Diagnosis not present

## 2015-01-11 DIAGNOSIS — M25611 Stiffness of right shoulder, not elsewhere classified: Secondary | ICD-10-CM | POA: Diagnosis not present

## 2015-01-11 DIAGNOSIS — R293 Abnormal posture: Secondary | ICD-10-CM | POA: Diagnosis not present

## 2015-01-11 DIAGNOSIS — M25512 Pain in left shoulder: Secondary | ICD-10-CM | POA: Diagnosis not present

## 2015-01-11 DIAGNOSIS — M2681 Anterior soft tissue impingement: Secondary | ICD-10-CM | POA: Diagnosis not present

## 2015-01-11 DIAGNOSIS — M4722 Other spondylosis with radiculopathy, cervical region: Secondary | ICD-10-CM | POA: Diagnosis not present

## 2015-01-11 DIAGNOSIS — M2560 Stiffness of unspecified joint, not elsewhere classified: Secondary | ICD-10-CM | POA: Diagnosis not present

## 2015-01-11 DIAGNOSIS — M25612 Stiffness of left shoulder, not elsewhere classified: Secondary | ICD-10-CM | POA: Diagnosis not present

## 2015-01-11 DIAGNOSIS — M542 Cervicalgia: Secondary | ICD-10-CM | POA: Diagnosis not present

## 2015-01-12 DIAGNOSIS — I2510 Atherosclerotic heart disease of native coronary artery without angina pectoris: Secondary | ICD-10-CM | POA: Diagnosis not present

## 2015-01-12 DIAGNOSIS — I5032 Chronic diastolic (congestive) heart failure: Secondary | ICD-10-CM | POA: Diagnosis not present

## 2015-01-12 DIAGNOSIS — Z8679 Personal history of other diseases of the circulatory system: Secondary | ICD-10-CM | POA: Diagnosis not present

## 2015-01-12 DIAGNOSIS — I482 Chronic atrial fibrillation: Secondary | ICD-10-CM | POA: Diagnosis not present

## 2015-01-12 DIAGNOSIS — I4892 Unspecified atrial flutter: Secondary | ICD-10-CM | POA: Diagnosis not present

## 2015-01-13 DIAGNOSIS — R351 Nocturia: Secondary | ICD-10-CM | POA: Diagnosis not present

## 2015-01-13 DIAGNOSIS — N318 Other neuromuscular dysfunction of bladder: Secondary | ICD-10-CM | POA: Diagnosis not present

## 2015-01-13 DIAGNOSIS — N3090 Cystitis, unspecified without hematuria: Secondary | ICD-10-CM | POA: Diagnosis not present

## 2015-01-13 DIAGNOSIS — R3916 Straining to void: Secondary | ICD-10-CM | POA: Diagnosis not present

## 2015-01-13 DIAGNOSIS — N401 Enlarged prostate with lower urinary tract symptoms: Secondary | ICD-10-CM | POA: Diagnosis not present

## 2015-01-13 DIAGNOSIS — R3912 Poor urinary stream: Secondary | ICD-10-CM | POA: Diagnosis not present

## 2015-02-10 DIAGNOSIS — J449 Chronic obstructive pulmonary disease, unspecified: Secondary | ICD-10-CM | POA: Diagnosis not present

## 2015-02-17 DIAGNOSIS — G8922 Chronic post-thoracotomy pain: Secondary | ICD-10-CM | POA: Diagnosis not present

## 2015-02-17 DIAGNOSIS — M4722 Other spondylosis with radiculopathy, cervical region: Secondary | ICD-10-CM | POA: Diagnosis not present

## 2015-02-17 DIAGNOSIS — M47816 Spondylosis without myelopathy or radiculopathy, lumbar region: Secondary | ICD-10-CM | POA: Diagnosis not present

## 2015-02-17 DIAGNOSIS — Z79891 Long term (current) use of opiate analgesic: Secondary | ICD-10-CM | POA: Diagnosis not present

## 2015-02-24 DIAGNOSIS — E782 Mixed hyperlipidemia: Secondary | ICD-10-CM | POA: Diagnosis not present

## 2015-02-24 DIAGNOSIS — N400 Enlarged prostate without lower urinary tract symptoms: Secondary | ICD-10-CM | POA: Diagnosis not present

## 2015-02-24 DIAGNOSIS — G8922 Chronic post-thoracotomy pain: Secondary | ICD-10-CM | POA: Diagnosis not present

## 2015-02-24 DIAGNOSIS — I2510 Atherosclerotic heart disease of native coronary artery without angina pectoris: Secondary | ICD-10-CM | POA: Diagnosis not present

## 2015-02-24 DIAGNOSIS — I10 Essential (primary) hypertension: Secondary | ICD-10-CM | POA: Diagnosis not present

## 2015-03-03 DIAGNOSIS — I495 Sick sinus syndrome: Secondary | ICD-10-CM | POA: Diagnosis not present

## 2015-03-12 DIAGNOSIS — J449 Chronic obstructive pulmonary disease, unspecified: Secondary | ICD-10-CM | POA: Diagnosis not present

## 2015-03-17 DIAGNOSIS — G4733 Obstructive sleep apnea (adult) (pediatric): Secondary | ICD-10-CM | POA: Diagnosis not present

## 2015-03-23 ENCOUNTER — Ambulatory Visit: Payer: Medicare Other | Admitting: Pulmonary Disease

## 2015-04-12 DIAGNOSIS — J449 Chronic obstructive pulmonary disease, unspecified: Secondary | ICD-10-CM | POA: Diagnosis not present

## 2015-04-14 DIAGNOSIS — G8922 Chronic post-thoracotomy pain: Secondary | ICD-10-CM | POA: Diagnosis not present

## 2015-04-14 DIAGNOSIS — M47816 Spondylosis without myelopathy or radiculopathy, lumbar region: Secondary | ICD-10-CM | POA: Diagnosis not present

## 2015-04-14 DIAGNOSIS — Z79891 Long term (current) use of opiate analgesic: Secondary | ICD-10-CM | POA: Diagnosis not present

## 2015-04-14 DIAGNOSIS — M4722 Other spondylosis with radiculopathy, cervical region: Secondary | ICD-10-CM | POA: Diagnosis not present

## 2015-04-22 DIAGNOSIS — H524 Presbyopia: Secondary | ICD-10-CM | POA: Diagnosis not present

## 2015-05-12 DIAGNOSIS — J449 Chronic obstructive pulmonary disease, unspecified: Secondary | ICD-10-CM | POA: Diagnosis not present

## 2015-05-24 DIAGNOSIS — E782 Mixed hyperlipidemia: Secondary | ICD-10-CM | POA: Diagnosis not present

## 2015-05-24 DIAGNOSIS — E559 Vitamin D deficiency, unspecified: Secondary | ICD-10-CM | POA: Diagnosis not present

## 2015-05-24 DIAGNOSIS — Z79899 Other long term (current) drug therapy: Secondary | ICD-10-CM | POA: Diagnosis not present

## 2015-05-24 DIAGNOSIS — Z23 Encounter for immunization: Secondary | ICD-10-CM | POA: Diagnosis not present

## 2015-05-24 DIAGNOSIS — Z1389 Encounter for screening for other disorder: Secondary | ICD-10-CM | POA: Diagnosis not present

## 2015-05-24 DIAGNOSIS — Z7409 Other reduced mobility: Secondary | ICD-10-CM | POA: Diagnosis not present

## 2015-05-24 DIAGNOSIS — I10 Essential (primary) hypertension: Secondary | ICD-10-CM | POA: Diagnosis not present

## 2015-05-24 DIAGNOSIS — J439 Emphysema, unspecified: Secondary | ICD-10-CM | POA: Diagnosis not present

## 2015-05-24 DIAGNOSIS — Z993 Dependence on wheelchair: Secondary | ICD-10-CM | POA: Diagnosis not present

## 2015-06-12 DIAGNOSIS — J449 Chronic obstructive pulmonary disease, unspecified: Secondary | ICD-10-CM | POA: Diagnosis not present

## 2015-06-15 DIAGNOSIS — Z79891 Long term (current) use of opiate analgesic: Secondary | ICD-10-CM | POA: Diagnosis not present

## 2015-06-15 DIAGNOSIS — M47816 Spondylosis without myelopathy or radiculopathy, lumbar region: Secondary | ICD-10-CM | POA: Diagnosis not present

## 2015-06-15 DIAGNOSIS — G8922 Chronic post-thoracotomy pain: Secondary | ICD-10-CM | POA: Diagnosis not present

## 2015-06-15 DIAGNOSIS — M4722 Other spondylosis with radiculopathy, cervical region: Secondary | ICD-10-CM | POA: Diagnosis not present

## 2015-06-20 DIAGNOSIS — I498 Other specified cardiac arrhythmias: Secondary | ICD-10-CM | POA: Diagnosis not present

## 2015-06-20 DIAGNOSIS — Z45010 Encounter for checking and testing of cardiac pacemaker pulse generator [battery]: Secondary | ICD-10-CM | POA: Diagnosis not present

## 2015-07-13 DIAGNOSIS — J449 Chronic obstructive pulmonary disease, unspecified: Secondary | ICD-10-CM | POA: Diagnosis not present

## 2015-07-18 DIAGNOSIS — G4733 Obstructive sleep apnea (adult) (pediatric): Secondary | ICD-10-CM | POA: Diagnosis not present

## 2015-08-03 ENCOUNTER — Other Ambulatory Visit: Payer: Self-pay

## 2015-08-03 NOTE — Patient Outreach (Signed)
[facility]) Care Management  08/03/2015  Grant Santos 18-Sep-1945 [place]:[id number]   Telephone contact today with patient referred from [facility] ED high utilization list.  Patient reports he does have COPD, and also atrial fibrillation.  He has a local PCP ([facility]), but also goes to the [place] for care.    Explanation of [facility] care management services provided.  Patient agreed for me to mail him some information, but stated he does not feel like he needs our services at this time.  Plan: Mail pamphlet and magnet          Close case due to patient refusal.  Candie Mile, RN, MSN [facility] [phone] Fax [phone]

## 2015-08-10 DIAGNOSIS — J449 Chronic obstructive pulmonary disease, unspecified: Secondary | ICD-10-CM | POA: Diagnosis not present

## 2015-08-24 DIAGNOSIS — E559 Vitamin D deficiency, unspecified: Secondary | ICD-10-CM | POA: Diagnosis not present

## 2015-08-24 DIAGNOSIS — N400 Enlarged prostate without lower urinary tract symptoms: Secondary | ICD-10-CM | POA: Diagnosis not present

## 2015-08-24 DIAGNOSIS — E782 Mixed hyperlipidemia: Secondary | ICD-10-CM | POA: Diagnosis not present

## 2015-08-24 DIAGNOSIS — Z79899 Other long term (current) drug therapy: Secondary | ICD-10-CM | POA: Diagnosis not present

## 2015-08-24 DIAGNOSIS — Z9181 History of falling: Secondary | ICD-10-CM | POA: Diagnosis not present

## 2015-08-24 DIAGNOSIS — I4891 Unspecified atrial fibrillation: Secondary | ICD-10-CM | POA: Diagnosis not present

## 2015-08-24 DIAGNOSIS — G250 Essential tremor: Secondary | ICD-10-CM | POA: Diagnosis not present

## 2015-09-10 DIAGNOSIS — J449 Chronic obstructive pulmonary disease, unspecified: Secondary | ICD-10-CM | POA: Diagnosis not present

## 2015-09-12 DIAGNOSIS — Z79891 Long term (current) use of opiate analgesic: Secondary | ICD-10-CM | POA: Diagnosis not present

## 2015-09-12 DIAGNOSIS — M4722 Other spondylosis with radiculopathy, cervical region: Secondary | ICD-10-CM | POA: Diagnosis not present

## 2015-09-12 DIAGNOSIS — G8922 Chronic post-thoracotomy pain: Secondary | ICD-10-CM | POA: Diagnosis not present

## 2015-09-12 DIAGNOSIS — M47816 Spondylosis without myelopathy or radiculopathy, lumbar region: Secondary | ICD-10-CM | POA: Diagnosis not present

## 2015-09-19 DIAGNOSIS — I10 Essential (primary) hypertension: Secondary | ICD-10-CM | POA: Diagnosis not present

## 2015-09-19 DIAGNOSIS — I2510 Atherosclerotic heart disease of native coronary artery without angina pectoris: Secondary | ICD-10-CM | POA: Diagnosis not present

## 2015-09-19 DIAGNOSIS — I480 Paroxysmal atrial fibrillation: Secondary | ICD-10-CM | POA: Diagnosis not present

## 2015-09-19 DIAGNOSIS — Z79899 Other long term (current) drug therapy: Secondary | ICD-10-CM | POA: Diagnosis not present

## 2015-09-19 DIAGNOSIS — Z8679 Personal history of other diseases of the circulatory system: Secondary | ICD-10-CM | POA: Diagnosis not present

## 2015-09-19 DIAGNOSIS — I5032 Chronic diastolic (congestive) heart failure: Secondary | ICD-10-CM | POA: Diagnosis not present

## 2015-10-10 DIAGNOSIS — J449 Chronic obstructive pulmonary disease, unspecified: Secondary | ICD-10-CM | POA: Diagnosis not present

## 2015-10-18 DIAGNOSIS — G4733 Obstructive sleep apnea (adult) (pediatric): Secondary | ICD-10-CM | POA: Diagnosis not present

## 2015-10-20 ENCOUNTER — Telehealth: Payer: Self-pay

## 2015-10-20 ENCOUNTER — Other Ambulatory Visit: Payer: Self-pay

## 2015-10-20 DIAGNOSIS — I712 Thoracic aortic aneurysm, without rupture, unspecified: Secondary | ICD-10-CM

## 2015-10-20 NOTE — Telephone Encounter (Signed)
rec'd phone call from pt. While at [employer].  Requested to have an order sent to the lab for Creatinine, that is needed prior to CTA chest/ abd/ pelvis on 5/12.  Advised the pt. Nurse will send an order.  The pt. called back approx. 25 minutes after the initial phone call questioning why the order wasn't sent, and verbalized frustration in having to wait.  Reported to the pt. That the order was placed in the computer, right after his initial phone call.  Stated he had requested that the order be faxed. Advised that the orders for the lab are processed electronically.  The pt. abruptly stated he was tired of waiting, and of wasting the time of the techs at [place].  Requested that his CTA appt. and appt. With Dr. Bridgett Larsson be cancelled, and then hung up the phone.

## 2015-10-21 ENCOUNTER — Inpatient Hospital Stay: Admission: RE | Admit: 2015-10-21 | Payer: Self-pay | Source: Ambulatory Visit

## 2015-10-21 NOTE — Telephone Encounter (Signed)
Cxl'd CTA and Md.

## 2015-10-28 ENCOUNTER — Ambulatory Visit: Payer: Self-pay | Admitting: Vascular Surgery

## 2015-11-10 DIAGNOSIS — M47816 Spondylosis without myelopathy or radiculopathy, lumbar region: Secondary | ICD-10-CM | POA: Diagnosis not present

## 2015-11-10 DIAGNOSIS — G8922 Chronic post-thoracotomy pain: Secondary | ICD-10-CM | POA: Diagnosis not present

## 2015-11-10 DIAGNOSIS — M4722 Other spondylosis with radiculopathy, cervical region: Secondary | ICD-10-CM | POA: Diagnosis not present

## 2015-11-10 DIAGNOSIS — Z79891 Long term (current) use of opiate analgesic: Secondary | ICD-10-CM | POA: Diagnosis not present

## 2015-11-10 DIAGNOSIS — J449 Chronic obstructive pulmonary disease, unspecified: Secondary | ICD-10-CM | POA: Diagnosis not present

## 2015-12-09 DIAGNOSIS — G451 Carotid artery syndrome (hemispheric): Secondary | ICD-10-CM | POA: Diagnosis not present

## 2015-12-09 DIAGNOSIS — I10 Essential (primary) hypertension: Secondary | ICD-10-CM | POA: Diagnosis not present

## 2015-12-09 DIAGNOSIS — I4891 Unspecified atrial fibrillation: Secondary | ICD-10-CM | POA: Diagnosis not present

## 2015-12-09 DIAGNOSIS — I6523 Occlusion and stenosis of bilateral carotid arteries: Secondary | ICD-10-CM | POA: Diagnosis not present

## 2015-12-09 DIAGNOSIS — G453 Amaurosis fugax: Secondary | ICD-10-CM | POA: Diagnosis not present

## 2015-12-10 DIAGNOSIS — J449 Chronic obstructive pulmonary disease, unspecified: Secondary | ICD-10-CM | POA: Diagnosis not present

## 2015-12-17 DIAGNOSIS — G453 Amaurosis fugax: Secondary | ICD-10-CM | POA: Diagnosis not present

## 2015-12-17 DIAGNOSIS — I6523 Occlusion and stenosis of bilateral carotid arteries: Secondary | ICD-10-CM | POA: Diagnosis not present

## 2015-12-21 DIAGNOSIS — Z950 Presence of cardiac pacemaker: Secondary | ICD-10-CM | POA: Diagnosis not present

## 2015-12-29 DIAGNOSIS — Z79899 Other long term (current) drug therapy: Secondary | ICD-10-CM | POA: Diagnosis not present

## 2015-12-29 DIAGNOSIS — I2510 Atherosclerotic heart disease of native coronary artery without angina pectoris: Secondary | ICD-10-CM | POA: Diagnosis not present

## 2015-12-29 DIAGNOSIS — J439 Emphysema, unspecified: Secondary | ICD-10-CM | POA: Diagnosis not present

## 2015-12-29 DIAGNOSIS — I4891 Unspecified atrial fibrillation: Secondary | ICD-10-CM | POA: Diagnosis not present

## 2015-12-29 DIAGNOSIS — E782 Mixed hyperlipidemia: Secondary | ICD-10-CM | POA: Diagnosis not present

## 2015-12-29 DIAGNOSIS — I10 Essential (primary) hypertension: Secondary | ICD-10-CM | POA: Diagnosis not present

## 2016-01-04 DIAGNOSIS — Z79899 Other long term (current) drug therapy: Secondary | ICD-10-CM

## 2016-01-04 HISTORY — DX: Other long term (current) drug therapy: Z79.899

## 2016-01-05 DIAGNOSIS — I25119 Atherosclerotic heart disease of native coronary artery with unspecified angina pectoris: Secondary | ICD-10-CM | POA: Diagnosis not present

## 2016-01-05 DIAGNOSIS — I482 Chronic atrial fibrillation: Secondary | ICD-10-CM | POA: Diagnosis not present

## 2016-01-05 DIAGNOSIS — Z79899 Other long term (current) drug therapy: Secondary | ICD-10-CM | POA: Diagnosis not present

## 2016-01-05 DIAGNOSIS — I10 Essential (primary) hypertension: Secondary | ICD-10-CM | POA: Diagnosis not present

## 2016-01-05 DIAGNOSIS — I5032 Chronic diastolic (congestive) heart failure: Secondary | ICD-10-CM | POA: Diagnosis not present

## 2016-01-05 DIAGNOSIS — I2510 Atherosclerotic heart disease of native coronary artery without angina pectoris: Secondary | ICD-10-CM | POA: Diagnosis not present

## 2016-01-06 DIAGNOSIS — M47816 Spondylosis without myelopathy or radiculopathy, lumbar region: Secondary | ICD-10-CM | POA: Diagnosis not present

## 2016-01-06 DIAGNOSIS — M4722 Other spondylosis with radiculopathy, cervical region: Secondary | ICD-10-CM | POA: Diagnosis not present

## 2016-01-06 DIAGNOSIS — Z79891 Long term (current) use of opiate analgesic: Secondary | ICD-10-CM | POA: Diagnosis not present

## 2016-01-06 DIAGNOSIS — G8922 Chronic post-thoracotomy pain: Secondary | ICD-10-CM | POA: Diagnosis not present

## 2016-01-09 DIAGNOSIS — Z79891 Long term (current) use of opiate analgesic: Secondary | ICD-10-CM | POA: Diagnosis not present

## 2016-01-10 DIAGNOSIS — J449 Chronic obstructive pulmonary disease, unspecified: Secondary | ICD-10-CM | POA: Diagnosis not present

## 2016-02-10 DIAGNOSIS — J449 Chronic obstructive pulmonary disease, unspecified: Secondary | ICD-10-CM | POA: Diagnosis not present

## 2016-02-28 DIAGNOSIS — E782 Mixed hyperlipidemia: Secondary | ICD-10-CM | POA: Diagnosis not present

## 2016-02-28 DIAGNOSIS — Z0000 Encounter for general adult medical examination without abnormal findings: Secondary | ICD-10-CM | POA: Diagnosis not present

## 2016-02-28 DIAGNOSIS — I4891 Unspecified atrial fibrillation: Secondary | ICD-10-CM | POA: Diagnosis not present

## 2016-02-28 DIAGNOSIS — Z23 Encounter for immunization: Secondary | ICD-10-CM | POA: Diagnosis not present

## 2016-02-28 DIAGNOSIS — J439 Emphysema, unspecified: Secondary | ICD-10-CM | POA: Diagnosis not present

## 2016-02-28 DIAGNOSIS — I10 Essential (primary) hypertension: Secondary | ICD-10-CM | POA: Diagnosis not present

## 2016-03-02 DIAGNOSIS — Z79891 Long term (current) use of opiate analgesic: Secondary | ICD-10-CM | POA: Diagnosis not present

## 2016-03-02 DIAGNOSIS — M47816 Spondylosis without myelopathy or radiculopathy, lumbar region: Secondary | ICD-10-CM | POA: Diagnosis not present

## 2016-03-02 DIAGNOSIS — G8922 Chronic post-thoracotomy pain: Secondary | ICD-10-CM | POA: Diagnosis not present

## 2016-03-02 DIAGNOSIS — M4722 Other spondylosis with radiculopathy, cervical region: Secondary | ICD-10-CM | POA: Diagnosis not present

## 2016-03-11 DIAGNOSIS — J449 Chronic obstructive pulmonary disease, unspecified: Secondary | ICD-10-CM | POA: Diagnosis not present

## 2016-04-02 DIAGNOSIS — Z79891 Long term (current) use of opiate analgesic: Secondary | ICD-10-CM | POA: Diagnosis not present

## 2016-04-02 DIAGNOSIS — G8922 Chronic post-thoracotomy pain: Secondary | ICD-10-CM | POA: Diagnosis not present

## 2016-04-02 DIAGNOSIS — M4722 Other spondylosis with radiculopathy, cervical region: Secondary | ICD-10-CM | POA: Diagnosis not present

## 2016-04-02 DIAGNOSIS — M47816 Spondylosis without myelopathy or radiculopathy, lumbar region: Secondary | ICD-10-CM | POA: Diagnosis not present

## 2016-04-11 DIAGNOSIS — J449 Chronic obstructive pulmonary disease, unspecified: Secondary | ICD-10-CM | POA: Diagnosis not present

## 2016-05-11 DIAGNOSIS — J449 Chronic obstructive pulmonary disease, unspecified: Secondary | ICD-10-CM | POA: Diagnosis not present

## 2016-05-28 DIAGNOSIS — G8922 Chronic post-thoracotomy pain: Secondary | ICD-10-CM | POA: Diagnosis not present

## 2016-05-28 DIAGNOSIS — Z79891 Long term (current) use of opiate analgesic: Secondary | ICD-10-CM | POA: Diagnosis not present

## 2016-05-28 DIAGNOSIS — M47816 Spondylosis without myelopathy or radiculopathy, lumbar region: Secondary | ICD-10-CM | POA: Diagnosis not present

## 2016-05-28 DIAGNOSIS — M4722 Other spondylosis with radiculopathy, cervical region: Secondary | ICD-10-CM | POA: Diagnosis not present

## 2016-06-11 DIAGNOSIS — J449 Chronic obstructive pulmonary disease, unspecified: Secondary | ICD-10-CM | POA: Diagnosis not present

## 2016-07-11 DIAGNOSIS — E785 Hyperlipidemia, unspecified: Secondary | ICD-10-CM | POA: Diagnosis not present

## 2016-07-11 DIAGNOSIS — I482 Chronic atrial fibrillation: Secondary | ICD-10-CM | POA: Diagnosis not present

## 2016-07-11 DIAGNOSIS — R079 Chest pain, unspecified: Secondary | ICD-10-CM | POA: Diagnosis not present

## 2016-07-11 DIAGNOSIS — I10 Essential (primary) hypertension: Secondary | ICD-10-CM | POA: Diagnosis not present

## 2016-07-11 DIAGNOSIS — Z79899 Other long term (current) drug therapy: Secondary | ICD-10-CM | POA: Diagnosis not present

## 2016-07-11 DIAGNOSIS — Z7901 Long term (current) use of anticoagulants: Secondary | ICD-10-CM | POA: Diagnosis not present

## 2016-07-11 DIAGNOSIS — I712 Thoracic aortic aneurysm, without rupture: Secondary | ICD-10-CM | POA: Diagnosis not present

## 2016-07-11 DIAGNOSIS — I2510 Atherosclerotic heart disease of native coronary artery without angina pectoris: Secondary | ICD-10-CM

## 2016-07-11 DIAGNOSIS — R0602 Shortness of breath: Secondary | ICD-10-CM | POA: Diagnosis not present

## 2016-07-11 DIAGNOSIS — I25709 Atherosclerosis of coronary artery bypass graft(s), unspecified, with unspecified angina pectoris: Secondary | ICD-10-CM | POA: Insufficient documentation

## 2016-07-11 DIAGNOSIS — Z950 Presence of cardiac pacemaker: Secondary | ICD-10-CM | POA: Diagnosis not present

## 2016-07-11 HISTORY — DX: Atherosclerotic heart disease of native coronary artery without angina pectoris: I25.10

## 2016-07-16 DIAGNOSIS — I495 Sick sinus syndrome: Secondary | ICD-10-CM | POA: Diagnosis not present

## 2016-07-16 DIAGNOSIS — G4733 Obstructive sleep apnea (adult) (pediatric): Secondary | ICD-10-CM | POA: Diagnosis not present

## 2016-07-16 DIAGNOSIS — Z950 Presence of cardiac pacemaker: Secondary | ICD-10-CM | POA: Diagnosis not present

## 2016-07-16 DIAGNOSIS — Z89512 Acquired absence of left leg below knee: Secondary | ICD-10-CM | POA: Diagnosis not present

## 2016-07-16 DIAGNOSIS — I2582 Chronic total occlusion of coronary artery: Secondary | ICD-10-CM | POA: Diagnosis not present

## 2016-07-16 DIAGNOSIS — I10 Essential (primary) hypertension: Secondary | ICD-10-CM | POA: Diagnosis not present

## 2016-07-16 DIAGNOSIS — R079 Chest pain, unspecified: Secondary | ICD-10-CM | POA: Diagnosis not present

## 2016-07-16 DIAGNOSIS — I482 Chronic atrial fibrillation: Secondary | ICD-10-CM | POA: Diagnosis not present

## 2016-07-16 DIAGNOSIS — Z7901 Long term (current) use of anticoagulants: Secondary | ICD-10-CM | POA: Diagnosis not present

## 2016-07-16 DIAGNOSIS — I2510 Atherosclerotic heart disease of native coronary artery without angina pectoris: Secondary | ICD-10-CM | POA: Diagnosis not present

## 2016-07-16 DIAGNOSIS — I25719 Atherosclerosis of autologous vein coronary artery bypass graft(s) with unspecified angina pectoris: Secondary | ICD-10-CM | POA: Diagnosis not present

## 2016-07-16 DIAGNOSIS — I5032 Chronic diastolic (congestive) heart failure: Secondary | ICD-10-CM | POA: Diagnosis not present

## 2016-07-16 DIAGNOSIS — I208 Other forms of angina pectoris: Secondary | ICD-10-CM | POA: Diagnosis not present

## 2016-07-16 DIAGNOSIS — I25119 Atherosclerotic heart disease of native coronary artery with unspecified angina pectoris: Secondary | ICD-10-CM | POA: Diagnosis not present

## 2016-07-16 DIAGNOSIS — Z87891 Personal history of nicotine dependence: Secondary | ICD-10-CM | POA: Diagnosis not present

## 2016-07-16 DIAGNOSIS — Z885 Allergy status to narcotic agent status: Secondary | ICD-10-CM | POA: Diagnosis not present

## 2016-07-16 DIAGNOSIS — Z79899 Other long term (current) drug therapy: Secondary | ICD-10-CM | POA: Diagnosis not present

## 2016-07-16 DIAGNOSIS — F0390 Unspecified dementia without behavioral disturbance: Secondary | ICD-10-CM | POA: Diagnosis not present

## 2016-07-16 DIAGNOSIS — I25110 Atherosclerotic heart disease of native coronary artery with unstable angina pectoris: Secondary | ICD-10-CM | POA: Diagnosis not present

## 2016-07-16 DIAGNOSIS — J449 Chronic obstructive pulmonary disease, unspecified: Secondary | ICD-10-CM | POA: Diagnosis not present

## 2016-07-16 DIAGNOSIS — E785 Hyperlipidemia, unspecified: Secondary | ICD-10-CM | POA: Diagnosis not present

## 2016-07-16 DIAGNOSIS — R0782 Intercostal pain: Secondary | ICD-10-CM | POA: Diagnosis not present

## 2016-07-16 DIAGNOSIS — R0789 Other chest pain: Secondary | ICD-10-CM | POA: Diagnosis not present

## 2016-07-16 DIAGNOSIS — I110 Hypertensive heart disease with heart failure: Secondary | ICD-10-CM | POA: Diagnosis not present

## 2016-07-17 DIAGNOSIS — I25708 Atherosclerosis of coronary artery bypass graft(s), unspecified, with other forms of angina pectoris: Secondary | ICD-10-CM | POA: Diagnosis not present

## 2016-07-17 DIAGNOSIS — Z8249 Family history of ischemic heart disease and other diseases of the circulatory system: Secondary | ICD-10-CM | POA: Diagnosis not present

## 2016-07-17 DIAGNOSIS — J449 Chronic obstructive pulmonary disease, unspecified: Secondary | ICD-10-CM | POA: Diagnosis not present

## 2016-07-17 DIAGNOSIS — I482 Chronic atrial fibrillation: Secondary | ICD-10-CM | POA: Diagnosis not present

## 2016-07-17 DIAGNOSIS — I252 Old myocardial infarction: Secondary | ICD-10-CM | POA: Diagnosis not present

## 2016-07-17 DIAGNOSIS — I5032 Chronic diastolic (congestive) heart failure: Secondary | ICD-10-CM | POA: Diagnosis not present

## 2016-07-17 DIAGNOSIS — I2510 Atherosclerotic heart disease of native coronary artery without angina pectoris: Secondary | ICD-10-CM | POA: Diagnosis not present

## 2016-07-17 DIAGNOSIS — I25110 Atherosclerotic heart disease of native coronary artery with unstable angina pectoris: Secondary | ICD-10-CM | POA: Diagnosis not present

## 2016-07-17 DIAGNOSIS — E784 Other hyperlipidemia: Secondary | ICD-10-CM | POA: Diagnosis not present

## 2016-07-17 DIAGNOSIS — I119 Hypertensive heart disease without heart failure: Secondary | ICD-10-CM | POA: Diagnosis not present

## 2016-07-17 DIAGNOSIS — R0782 Intercostal pain: Secondary | ICD-10-CM | POA: Diagnosis not present

## 2016-07-17 DIAGNOSIS — G4733 Obstructive sleep apnea (adult) (pediatric): Secondary | ICD-10-CM | POA: Diagnosis not present

## 2016-07-17 DIAGNOSIS — Z950 Presence of cardiac pacemaker: Secondary | ICD-10-CM | POA: Diagnosis not present

## 2016-07-17 DIAGNOSIS — I25118 Atherosclerotic heart disease of native coronary artery with other forms of angina pectoris: Secondary | ICD-10-CM | POA: Diagnosis not present

## 2016-07-17 DIAGNOSIS — F17211 Nicotine dependence, cigarettes, in remission: Secondary | ICD-10-CM | POA: Diagnosis not present

## 2016-07-17 DIAGNOSIS — I10 Essential (primary) hypertension: Secondary | ICD-10-CM | POA: Diagnosis not present

## 2016-07-20 DIAGNOSIS — Z950 Presence of cardiac pacemaker: Secondary | ICD-10-CM | POA: Diagnosis not present

## 2016-07-24 DIAGNOSIS — R258 Other abnormal involuntary movements: Secondary | ICD-10-CM | POA: Diagnosis not present

## 2016-07-24 DIAGNOSIS — Z79899 Other long term (current) drug therapy: Secondary | ICD-10-CM | POA: Diagnosis not present

## 2016-07-24 DIAGNOSIS — Z6829 Body mass index (BMI) 29.0-29.9, adult: Secondary | ICD-10-CM | POA: Diagnosis not present

## 2016-07-24 DIAGNOSIS — I4891 Unspecified atrial fibrillation: Secondary | ICD-10-CM | POA: Diagnosis not present

## 2016-07-24 DIAGNOSIS — J439 Emphysema, unspecified: Secondary | ICD-10-CM | POA: Diagnosis not present

## 2016-07-24 DIAGNOSIS — Z09 Encounter for follow-up examination after completed treatment for conditions other than malignant neoplasm: Secondary | ICD-10-CM | POA: Diagnosis not present

## 2016-07-24 DIAGNOSIS — I10 Essential (primary) hypertension: Secondary | ICD-10-CM | POA: Diagnosis not present

## 2016-07-24 DIAGNOSIS — G8922 Chronic post-thoracotomy pain: Secondary | ICD-10-CM | POA: Diagnosis not present

## 2016-07-24 DIAGNOSIS — E782 Mixed hyperlipidemia: Secondary | ICD-10-CM | POA: Diagnosis not present

## 2016-07-31 DIAGNOSIS — M47816 Spondylosis without myelopathy or radiculopathy, lumbar region: Secondary | ICD-10-CM | POA: Diagnosis not present

## 2016-07-31 DIAGNOSIS — M4722 Other spondylosis with radiculopathy, cervical region: Secondary | ICD-10-CM | POA: Diagnosis not present

## 2016-07-31 DIAGNOSIS — G8922 Chronic post-thoracotomy pain: Secondary | ICD-10-CM | POA: Diagnosis not present

## 2016-07-31 DIAGNOSIS — Z79891 Long term (current) use of opiate analgesic: Secondary | ICD-10-CM | POA: Diagnosis not present

## 2016-08-09 DIAGNOSIS — Z950 Presence of cardiac pacemaker: Secondary | ICD-10-CM | POA: Diagnosis not present

## 2016-08-09 DIAGNOSIS — I482 Chronic atrial fibrillation: Secondary | ICD-10-CM | POA: Diagnosis not present

## 2016-08-09 DIAGNOSIS — I5032 Chronic diastolic (congestive) heart failure: Secondary | ICD-10-CM | POA: Diagnosis not present

## 2016-08-09 DIAGNOSIS — I2510 Atherosclerotic heart disease of native coronary artery without angina pectoris: Secondary | ICD-10-CM | POA: Diagnosis not present

## 2016-09-11 DIAGNOSIS — R413 Other amnesia: Secondary | ICD-10-CM | POA: Diagnosis not present

## 2016-09-11 DIAGNOSIS — R258 Other abnormal involuntary movements: Secondary | ICD-10-CM | POA: Diagnosis not present

## 2016-09-11 DIAGNOSIS — G250 Essential tremor: Secondary | ICD-10-CM | POA: Diagnosis not present

## 2016-09-18 DIAGNOSIS — R413 Other amnesia: Secondary | ICD-10-CM | POA: Diagnosis not present

## 2016-09-25 DIAGNOSIS — Z79891 Long term (current) use of opiate analgesic: Secondary | ICD-10-CM | POA: Diagnosis not present

## 2016-09-25 DIAGNOSIS — M47816 Spondylosis without myelopathy or radiculopathy, lumbar region: Secondary | ICD-10-CM | POA: Diagnosis not present

## 2016-09-25 DIAGNOSIS — M4722 Other spondylosis with radiculopathy, cervical region: Secondary | ICD-10-CM | POA: Diagnosis not present

## 2016-09-25 DIAGNOSIS — G8922 Chronic post-thoracotomy pain: Secondary | ICD-10-CM | POA: Diagnosis not present

## 2016-10-10 DIAGNOSIS — R259 Unspecified abnormal involuntary movements: Secondary | ICD-10-CM | POA: Diagnosis not present

## 2016-10-10 DIAGNOSIS — R413 Other amnesia: Secondary | ICD-10-CM | POA: Diagnosis not present

## 2016-10-10 DIAGNOSIS — I5032 Chronic diastolic (congestive) heart failure: Secondary | ICD-10-CM | POA: Diagnosis not present

## 2016-10-10 DIAGNOSIS — Z950 Presence of cardiac pacemaker: Secondary | ICD-10-CM | POA: Diagnosis not present

## 2016-10-10 DIAGNOSIS — Z7901 Long term (current) use of anticoagulants: Secondary | ICD-10-CM | POA: Diagnosis not present

## 2016-10-10 DIAGNOSIS — I2510 Atherosclerotic heart disease of native coronary artery without angina pectoris: Secondary | ICD-10-CM | POA: Diagnosis not present

## 2016-10-10 DIAGNOSIS — Z951 Presence of aortocoronary bypass graft: Secondary | ICD-10-CM | POA: Diagnosis not present

## 2016-10-10 DIAGNOSIS — Z8679 Personal history of other diseases of the circulatory system: Secondary | ICD-10-CM | POA: Diagnosis not present

## 2016-10-10 DIAGNOSIS — Z9981 Dependence on supplemental oxygen: Secondary | ICD-10-CM | POA: Diagnosis not present

## 2016-10-10 DIAGNOSIS — I482 Chronic atrial fibrillation: Secondary | ICD-10-CM | POA: Diagnosis not present

## 2016-10-10 DIAGNOSIS — I495 Sick sinus syndrome: Secondary | ICD-10-CM | POA: Diagnosis not present

## 2016-10-10 DIAGNOSIS — Z79899 Other long term (current) drug therapy: Secondary | ICD-10-CM | POA: Diagnosis not present

## 2016-10-17 DIAGNOSIS — I5032 Chronic diastolic (congestive) heart failure: Secondary | ICD-10-CM | POA: Diagnosis not present

## 2016-10-17 DIAGNOSIS — Z950 Presence of cardiac pacemaker: Secondary | ICD-10-CM | POA: Diagnosis not present

## 2016-10-30 DIAGNOSIS — R258 Other abnormal involuntary movements: Secondary | ICD-10-CM | POA: Diagnosis not present

## 2016-10-30 DIAGNOSIS — G250 Essential tremor: Secondary | ICD-10-CM | POA: Diagnosis not present

## 2016-10-30 DIAGNOSIS — R413 Other amnesia: Secondary | ICD-10-CM | POA: Diagnosis not present

## 2016-10-31 DIAGNOSIS — Z1211 Encounter for screening for malignant neoplasm of colon: Secondary | ICD-10-CM | POA: Diagnosis not present

## 2016-10-31 DIAGNOSIS — Z1389 Encounter for screening for other disorder: Secondary | ICD-10-CM | POA: Diagnosis not present

## 2016-10-31 DIAGNOSIS — R258 Other abnormal involuntary movements: Secondary | ICD-10-CM | POA: Diagnosis not present

## 2016-10-31 DIAGNOSIS — Z9181 History of falling: Secondary | ICD-10-CM | POA: Diagnosis not present

## 2016-10-31 DIAGNOSIS — Z139 Encounter for screening, unspecified: Secondary | ICD-10-CM | POA: Diagnosis not present

## 2016-10-31 DIAGNOSIS — E669 Obesity, unspecified: Secondary | ICD-10-CM | POA: Diagnosis not present

## 2016-10-31 DIAGNOSIS — G4730 Sleep apnea, unspecified: Secondary | ICD-10-CM | POA: Diagnosis not present

## 2016-10-31 DIAGNOSIS — E782 Mixed hyperlipidemia: Secondary | ICD-10-CM | POA: Diagnosis not present

## 2016-10-31 DIAGNOSIS — E559 Vitamin D deficiency, unspecified: Secondary | ICD-10-CM | POA: Diagnosis not present

## 2016-10-31 DIAGNOSIS — K219 Gastro-esophageal reflux disease without esophagitis: Secondary | ICD-10-CM | POA: Diagnosis not present

## 2016-10-31 DIAGNOSIS — I10 Essential (primary) hypertension: Secondary | ICD-10-CM | POA: Diagnosis not present

## 2016-10-31 DIAGNOSIS — Z6830 Body mass index (BMI) 30.0-30.9, adult: Secondary | ICD-10-CM | POA: Diagnosis not present

## 2016-10-31 NOTE — Progress Notes (Deleted)
Subjective:   Grant Santos was seen in consultation in the movement disorder clinic at the request of Nani Skillern, PA-C.  His PCP is Nicholos Johns, MD.  The records that were made available to me were reviewed.  The evaluation is for tremor.  Tremor started approximately *** ago and involves the ***.  Tremor is most noticeable when ***.   There is *** family hx of tremor.  Records indicate that was started on primidone on 09/11/16.  Looks like he started that on 09/12/16 and took it 1-2 days and called office on AM of 4/618 and said that He thought that medication made him off balance      Affected by caffeine:  {yes no:314532} Affected by alcohol:  {yes no:314532} Affected by stress:  {yes no:314532} Affected by fatigue:  {yes no:314532} Spills soup if on spoon:  {yes no:314532} Spills glass of liquid if full:  {yes no:314532} Affects ADL's (tying shoes, brushing teeth, etc):  {yes no:314532}  Current/Previously tried tremor medications: ***primidone and topamax; beta blockers (SOB)  Current medications that may exacerbate tremor:  ***  Outside reports reviewed: {Outside review:15817}.  Allergies  Allergen Reactions  . Fentanyl     Makes breathing problems worse  . Metoprolol Tartrate     REACTION: sob  . Morphine Sulfate Itching    Outpatient Encounter Prescriptions as of 11/01/2016  Medication Sig  . acetaminophen (TYLENOL) 325 MG tablet Take 650 mg by mouth 3 (three) times daily as needed for headache.  . ADVAIR DISKUS 250-50 MCG/DOSE AEPB INHALE 1 PUFFS EVERY 12 HOURS  . albuterol (PROVENTIL HFA;VENTOLIN HFA) 108 (90 BASE) MCG/ACT inhaler Inhale 2 puffs into the lungs every 6 (six) hours as needed for shortness of breath.  Marland Kitchen amiodarone (PACERONE) 200 MG tablet Take 200 mg by mouth daily.   Marland Kitchen amoxicillin-clavulanate (AUGMENTIN) 875-125 MG per tablet Take 1 tablet by mouth 2 (two) times daily.  Marland Kitchen apixaban (ELIQUIS) 5 MG TABS tablet Take 2 tablets (10 mg total) by mouth 2  (two) times daily. Take 2 tablets (10mg ) 2 times daily x 3 days, then 1 tablet (5mg ) 2 times daily starting 07/16/14. (Patient taking differently: Take 5 mg by mouth 2 (two) times daily. )  . atorvastatin (LIPITOR) 40 MG tablet Take 40 mg by mouth at bedtime.  . bethanechol (URECHOLINE) 25 MG tablet Take 25 mg by mouth 3 (three) times daily.  . clopidogrel (PLAVIX) 75 MG tablet Take 75 mg by mouth daily.   Marland Kitchen diltiazem (CARDIZEM) 90 MG tablet Take 2 tablets (180 mg total) by mouth every 12 (twelve) hours.  Marland Kitchen donepezil (ARICEPT) 10 MG tablet Take 1 tablet by mouth daily.  Marland Kitchen esomeprazole (NEXIUM) 20 MG capsule Take 20 mg by mouth daily at 12 noon.  . Fish Oil-Cholecalciferol (FISH OIL + D3) 1000-1000 MG-UNIT CAPS Take 1 capsule by mouth daily.  . furosemide (LASIX) 40 MG tablet Take 40 mg by mouth every morning.   . gabapentin (NEURONTIN) 600 MG tablet Take 600 mg by mouth every 8 (eight) hours.   Marland Kitchen glucosamine-chondroitin 500-400 MG tablet Take 1 tablet by mouth daily.   Marland Kitchen guaiFENesin (MUCINEX) 600 MG 12 hr tablet Take 1,200 mg by mouth daily.   Marland Kitchen levalbuterol (XOPENEX) 1.25 MG/3ML nebulizer solution Take 1.25 mg by nebulization every 8 (eight) hours.  . Multiple Vitamin (MULTIVITAMIN) capsule Take 1 capsule by mouth daily.    . mupirocin ointment (BACTROBAN) 2 %   . nitroGLYCERIN (NITROSTAT) 0.4 MG SL tablet  Place 0.4 mg under the tongue every 5 (five) minutes as needed.    Marland Kitchen oxycodone (ROXICODONE) 30 MG immediate release tablet Take 1 tablet (30 mg total) by mouth every 4 (four) hours as needed for pain.  . polyethylene glycol (MIRALAX / GLYCOLAX) packet Take 17 g by mouth every other day.  . promethazine (PHENERGAN) 25 MG tablet Take 25 mg by mouth every 6 (six) hours as needed.   . ramipril (ALTACE) 2.5 MG capsule Take 2.5 mg by mouth daily.    Marland Kitchen tiZANidine (ZANAFLEX) 2 MG tablet Take 2 mg by mouth every morning.   . TUDORZA PRESSAIR 400 MCG/ACT AEPB Inhale 1 puff into the lungs 2 (two) times  daily.  . Wheat Dextrin (BENEFIBER PO) Take 1 each by mouth every other day. 1 capful every other day   No facility-administered encounter medications on file as of 11/01/2016.     Past Medical History:  Diagnosis Date  . Asthma   . Collagen vascular disease   . Emphysema   . Hypertension   . OSA (obstructive sleep apnea)     Past Surgical History:  Procedure Laterality Date  . BELOW KNEE LEG AMPUTATION    . CORONARY ARTERY BYPASS GRAFT  2002  . EMBOLECTOMY Left 07/03/2014   Procedure: EMBOLECTOMY BRACHIAL;  Surgeon: Conrad Bradley, MD;  Location: [place] [facility];  Service: Vascular;  Laterality: Left;  Left brachial and ulnar embolectomy.  Marland Kitchen PACEMAKER INSERTION  09/21/2014  . SPINAL FUSION    . THORACIC AORTIC ANEURYSM REPAIR  2002    Social History   Social History  . Marital status: Married    Spouse name: N/A  . Number of children: N/A  . Years of education: N/A   Occupational History  . Not on file.   Social History Main Topics  . Smoking status: Former Smoker    Packs/day: 1.00    Years: 35.00    Types: Cigarettes    Quit date: 06/11/1996  . Smokeless tobacco: Not on file  . Alcohol use Not on file  . Drug use: Unknown  . Sexual activity: Not on file   Other Topics Concern  . Not on file   Social History Narrative  . No narrative on file    No family status information on file.    Review of Systems A complete 10 system ROS was obtained and was negative apart from what is mentioned.   Objective:   VITALS:  There were no vitals filed for this visit. Gen:  Appears stated age and in NAD. HEENT:  Normocephalic, atraumatic. The mucous membranes are moist. The superficial temporal arteries are without ropiness or tenderness. Cardiovascular: Regular rate and rhythm. Lungs: Clear to auscultation bilaterally. Neck: There are no carotid bruits noted bilaterally.  NEUROLOGICAL:  Orientation:  The patient is alert and oriented x 3.  Recent and remote memory are  intact.  Attention span and concentration are normal.  Able to name objects and repeat without trouble.  Fund of knowledge is appropriate Cranial nerves: There is good facial symmetry. The pupils are equal round and reactive to light bilaterally. Fundoscopic exam reveals clear disc margins bilaterally. Extraocular muscles are intact and visual fields are full to confrontational testing. Speech is fluent and clear. Soft palate rises symmetrically and there is no tongue deviation. Hearing is intact to conversational tone. Tone: Tone is good throughout. Sensation: Sensation is intact to light touch and pinprick throughout (facial, trunk, extremities). Vibration is intact at the bilateral big  toe. There is no extinction with double simultaneous stimulation. There is no sensory dermatomal level identified. Coordination:  The patient has no dysdiadichokinesia or dysmetria. Motor: Strength is 5/5 in the bilateral upper and lower extremities.  Shoulder shrug is equal bilaterally.  There is no pronator drift.  There are no fasciculations noted. DTR's: Deep tendon reflexes are 2/4 at the bilateral biceps, triceps, brachioradialis, patella and achilles.  Plantar responses are downgoing bilaterally. Gait and Station: The patient is able to ambulate without difficulty. The patient is able to heel toe walk without any difficulty. The patient is able to ambulate in a tandem fashion. The patient is able to stand in the Romberg position.   MOVEMENT EXAM: Tremor:  There is *** tremor in the UE, noted most significantly with action.  The patient is *** able to draw Archimedes spirals without significant difficulty.  There is *** tremor at rest.  The patient is *** able to pour water from one glass to another without spilling it.     Assessment/Plan:   1. Tremor  -I did tell the patient that while he may have essential tremor, because he is also on amiodarone it would be difficult to rule out that as the cause of  tremor, or at least a significant source of exacerbation of tremor.  I told the patient that about 1/3 of patients who are on amiodarone will have some degree of tremor.  I did not advise the patient stop or alter the medication in any way, but did tell the patient that if the medication is stopped for any reason, it can take up to 6 months to know if the tremor was from the medication.  -reports that he has tried primidone but looking back at notes it looks like perhaps he had first dose effect.  Explained to the patient what that was.  However, patient now on eliquis and patient cannot have primidone due to drug interaction.  -reports that he cannot take beta blockers because of SOB   -This is evidenced by the symmetrical nature and longstanding hx of gradually getting worse.  We discussed nature and pathophysiology.  We discussed that this can continue to gradually get worse with time.  We discussed that some medications can worsen this, as can caffeine use.  We discussed medication therapy as well as surgical therapy.  Ultimately, the patient decided to ***.    CC:  Nicholos Johns, MD

## 2016-11-01 ENCOUNTER — Ambulatory Visit: Payer: Self-pay | Admitting: Neurology

## 2016-11-07 DIAGNOSIS — R258 Other abnormal involuntary movements: Secondary | ICD-10-CM | POA: Diagnosis not present

## 2016-11-07 DIAGNOSIS — R413 Other amnesia: Secondary | ICD-10-CM | POA: Diagnosis not present

## 2016-11-20 DIAGNOSIS — G8922 Chronic post-thoracotomy pain: Secondary | ICD-10-CM | POA: Diagnosis not present

## 2016-11-20 DIAGNOSIS — M4722 Other spondylosis with radiculopathy, cervical region: Secondary | ICD-10-CM | POA: Diagnosis not present

## 2016-11-20 DIAGNOSIS — Z79891 Long term (current) use of opiate analgesic: Secondary | ICD-10-CM | POA: Diagnosis not present

## 2016-11-20 DIAGNOSIS — M47816 Spondylosis without myelopathy or radiculopathy, lumbar region: Secondary | ICD-10-CM | POA: Diagnosis not present

## 2016-12-06 NOTE — Progress Notes (Deleted)
Subjective:   Grant Santos was seen in consultation in the movement disorder clinic at the request of Nani Skillern, [place].  Her PCP is Nicholos Johns, MD.  The evaluation is for tremor.  Tremor started approximately *** ago and involves the ***.  Tremor is most noticeable when ***.   There is *** family hx of tremor.  Pt has been on primidone but states that he could not tolerate the medication as ***.  He is currently on low dose topamax for tremor but couldn't tolerate higher dosages of the medication ***.   He is unable to take beta blockers because of COPD.  He is on amiodarone and has been since at least 12/2014 (cannot get records from [facility] everywhere earlier than that).  Affected by caffeine:  {yes no:314532} Affected by alcohol:  {yes no:314532} Affected by stress:  {yes no:314532} Affected by fatigue:  {yes no:314532} Spills soup if on spoon:  {yes no:314532} Spills glass of liquid if full:  {yes no:314532} Affects ADL's (tying shoes, brushing teeth, etc):  {yes no:314532}  Current/Previously tried tremor medications: ***  Current medications that may exacerbate tremor:  ***  Outside reports reviewed: {Outside review:15817}.  Allergies  Allergen Reactions  . Fentanyl     Makes breathing problems worse  . Metoprolol Tartrate     REACTION: sob  . Morphine Sulfate Itching    Outpatient Encounter Prescriptions as of 12/07/2016  Medication Sig  . acetaminophen (TYLENOL) 325 MG tablet Take 650 mg by mouth 3 (three) times daily as needed for headache.  . ADVAIR DISKUS 250-50 MCG/DOSE AEPB INHALE 1 PUFFS EVERY 12 HOURS  . albuterol (PROVENTIL HFA;VENTOLIN HFA) 108 (90 BASE) MCG/ACT inhaler Inhale 2 puffs into the lungs every 6 (six) hours as needed for shortness of breath.  Marland Kitchen amiodarone (PACERONE) 200 MG tablet Take 200 mg by mouth daily.   Marland Kitchen amoxicillin-clavulanate (AUGMENTIN) 875-125 MG per tablet Take 1 tablet by mouth 2 (two) times daily.  Marland Kitchen apixaban (ELIQUIS) 5 MG TABS tablet  Take 2 tablets (10 mg total) by mouth 2 (two) times daily. Take 2 tablets (10mg ) 2 times daily x 3 days, then 1 tablet (5mg ) 2 times daily starting 07/16/14. (Patient taking differently: Take 5 mg by mouth 2 (two) times daily. )  . atorvastatin (LIPITOR) 40 MG tablet Take 40 mg by mouth at bedtime.  . bethanechol (URECHOLINE) 25 MG tablet Take 25 mg by mouth 3 (three) times daily.  . clopidogrel (PLAVIX) 75 MG tablet Take 75 mg by mouth daily.   Marland Kitchen diltiazem (CARDIZEM) 90 MG tablet Take 2 tablets (180 mg total) by mouth every 12 (twelve) hours.  Marland Kitchen donepezil (ARICEPT) 10 MG tablet Take 1 tablet by mouth daily.  Marland Kitchen esomeprazole (NEXIUM) 20 MG capsule Take 20 mg by mouth daily at 12 noon.  . Fish Oil-Cholecalciferol (FISH OIL + D3) 1000-1000 MG-UNIT CAPS Take 1 capsule by mouth daily.  . furosemide (LASIX) 40 MG tablet Take 40 mg by mouth every morning.   . gabapentin (NEURONTIN) 600 MG tablet Take 600 mg by mouth every 8 (eight) hours.   Marland Kitchen glucosamine-chondroitin 500-400 MG tablet Take 1 tablet by mouth daily.   Marland Kitchen guaiFENesin (MUCINEX) 600 MG 12 hr tablet Take 1,200 mg by mouth daily.   Marland Kitchen levalbuterol (XOPENEX) 1.25 MG/3ML nebulizer solution Take 1.25 mg by nebulization every 8 (eight) hours.  . Multiple Vitamin (MULTIVITAMIN) capsule Take 1 capsule by mouth daily.    . mupirocin ointment (BACTROBAN) 2 %   .  nitroGLYCERIN (NITROSTAT) 0.4 MG SL tablet Place 0.4 mg under the tongue every 5 (five) minutes as needed.    Marland Kitchen oxycodone (ROXICODONE) 30 MG immediate release tablet Take 1 tablet (30 mg total) by mouth every 4 (four) hours as needed for pain.  . polyethylene glycol (MIRALAX / GLYCOLAX) packet Take 17 g by mouth every other day.  . promethazine (PHENERGAN) 25 MG tablet Take 25 mg by mouth every 6 (six) hours as needed.   . ramipril (ALTACE) 2.5 MG capsule Take 2.5 mg by mouth daily.    Marland Kitchen tiZANidine (ZANAFLEX) 2 MG tablet Take 2 mg by mouth every morning.   . TUDORZA PRESSAIR 400 MCG/ACT AEPB  Inhale 1 puff into the lungs 2 (two) times daily.  . Wheat Dextrin (BENEFIBER PO) Take 1 each by mouth every other day. 1 capful every other day   No facility-administered encounter medications on file as of 12/07/2016.     Past Medical History:  Diagnosis Date  . Asthma   . Collagen vascular disease   . Emphysema   . Hypertension   . OSA (obstructive sleep apnea)     Past Surgical History:  Procedure Laterality Date  . BELOW KNEE LEG AMPUTATION    . CORONARY ARTERY BYPASS GRAFT  2002  . EMBOLECTOMY Left 07/03/2014   Procedure: EMBOLECTOMY BRACHIAL;  Surgeon: Conrad Delaware, MD;  Location: [place];  Service: Vascular;  Laterality: Left;  Left brachial and ulnar embolectomy.  Marland Kitchen PACEMAKER INSERTION  09/21/2014  . SPINAL FUSION    . THORACIC AORTIC ANEURYSM REPAIR  2002    Social History   Social History  . Marital status: Married    Spouse name: N/A  . Number of children: N/A  . Years of education: N/A   Occupational History  . Not on file.   Social History Main Topics  . Smoking status: Former Smoker    Packs/day: 1.00    Years: 35.00    Types: Cigarettes    Quit date: 06/11/1996  . Smokeless tobacco: Not on file  . Alcohol use Not on file  . Drug use: Unknown  . Sexual activity: Not on file   Other Topics Concern  . Not on file   Social History Narrative  . No narrative on file    No family status information on file.    Review of Systems A complete 10 system ROS was obtained and was negative apart from what is mentioned.   Objective:   VITALS:  There were no vitals filed for this visit. Gen:  Appears stated age and in NAD. HEENT:  Normocephalic, atraumatic. The mucous membranes are moist. The superficial temporal arteries are without ropiness or tenderness. Cardiovascular: Regular rate and rhythm. Lungs: Clear to auscultation bilaterally. Neck: There are no carotid bruits noted bilaterally.  NEUROLOGICAL:  Orientation:  The patient is alert and  oriented x 3.  Recent and remote memory are intact.  Attention span and concentration are normal.  Able to name objects and repeat without trouble.  Fund of knowledge is appropriate Cranial nerves: There is good facial symmetry. The pupils are equal round and reactive to light bilaterally. Fundoscopic exam reveals clear disc margins bilaterally. Extraocular muscles are intact and visual fields are full to confrontational testing. Speech is fluent and clear. Soft palate rises symmetrically and there is no tongue deviation. Hearing is intact to conversational tone. Tone: Tone is good throughout. Sensation: Sensation is intact to light touch and pinprick throughout (facial, trunk, extremities). Vibration  is intact at the bilateral big toe. There is no extinction with double simultaneous stimulation. There is no sensory dermatomal level identified. Coordination:  The patient has no dysdiadichokinesia or dysmetria. Motor: Strength is 5/5 in the bilateral upper and lower extremities.  Shoulder shrug is equal bilaterally.  There is no pronator drift.  There are no fasciculations noted. DTR's: Deep tendon reflexes are 2/4 at the bilateral biceps, triceps, brachioradialis, patella and achilles.  Plantar responses are downgoing bilaterally. Gait and Station: The patient is able to ambulate without difficulty. The patient is able to heel toe walk without any difficulty. The patient is able to ambulate in a tandem fashion. The patient is able to stand in the Romberg position.   MOVEMENT EXAM: Tremor:  There is *** tremor in the UE, noted most significantly with action.  The patient is *** able to draw Archimedes spirals without significant difficulty.  There is *** tremor at rest.  The patient is *** able to pour water from one glass to another without spilling it.  Labs: Patient had lab work on 07/16/2016.  Today was 137, potassium 3.9, chloride 95, CO2 35, BUN 11, creatinine 1.02, glucose 101, AST 29, ALT 35,  alkaline phosphatase 146.  On 09/18/2016, the patient's TSH was 1.880 and B12 was 1264.     Assessment/Plan:   1.  Tremor  -I did tell the patient that while he may have essential tremor, because he is also on amiodarone it would be difficult to rule out that as the cause of tremor, or at least a significant source of exacerbation of tremor.  I told the patient that about 1/3 of patients who are on amiodarone will have some degree of tremor.  I did not advise the patient stop or alter the medication in any way, but did tell the patient that if the medication is stopped for any reason, it can take up to 6 months to know if the tremor was from the medication.  -Pt unable to tolerate primidone but likely could not be on it anyway due to interaction with eliquis  -Pt cannot be on beta blockers because of COPD  -Pt on low dose topamax  -***   CC:  Nicholos Johns, MD

## 2016-12-07 ENCOUNTER — Ambulatory Visit: Payer: Self-pay | Admitting: Neurology

## 2016-12-09 DIAGNOSIS — J449 Chronic obstructive pulmonary disease, unspecified: Secondary | ICD-10-CM | POA: Diagnosis not present

## 2016-12-18 DIAGNOSIS — Z86010 Personal history of colonic polyps: Secondary | ICD-10-CM | POA: Diagnosis not present

## 2016-12-18 DIAGNOSIS — K5900 Constipation, unspecified: Secondary | ICD-10-CM | POA: Diagnosis not present

## 2016-12-18 DIAGNOSIS — R112 Nausea with vomiting, unspecified: Secondary | ICD-10-CM | POA: Diagnosis not present

## 2016-12-20 ENCOUNTER — Ambulatory Visit: Payer: Self-pay | Admitting: Cardiology

## 2016-12-28 ENCOUNTER — Ambulatory Visit: Payer: PPO | Admitting: Neurology

## 2017-01-09 DIAGNOSIS — J449 Chronic obstructive pulmonary disease, unspecified: Secondary | ICD-10-CM | POA: Diagnosis not present

## 2017-01-15 DIAGNOSIS — Z79891 Long term (current) use of opiate analgesic: Secondary | ICD-10-CM | POA: Diagnosis not present

## 2017-01-15 DIAGNOSIS — M47816 Spondylosis without myelopathy or radiculopathy, lumbar region: Secondary | ICD-10-CM | POA: Diagnosis not present

## 2017-01-15 DIAGNOSIS — M4722 Other spondylosis with radiculopathy, cervical region: Secondary | ICD-10-CM | POA: Diagnosis not present

## 2017-01-15 DIAGNOSIS — G8922 Chronic post-thoracotomy pain: Secondary | ICD-10-CM | POA: Diagnosis not present

## 2017-01-17 ENCOUNTER — Ambulatory Visit (INDEPENDENT_AMBULATORY_CARE_PROVIDER_SITE_OTHER): Payer: PPO | Admitting: Cardiology

## 2017-01-17 ENCOUNTER — Encounter: Payer: Self-pay | Admitting: Cardiology

## 2017-01-17 VITALS — BP 124/60 | HR 61 | Ht 75.0 in | Wt 225.0 lb

## 2017-01-17 DIAGNOSIS — Z79899 Other long term (current) drug therapy: Secondary | ICD-10-CM | POA: Diagnosis not present

## 2017-01-17 DIAGNOSIS — I25709 Atherosclerosis of coronary artery bypass graft(s), unspecified, with unspecified angina pectoris: Secondary | ICD-10-CM | POA: Diagnosis not present

## 2017-01-17 DIAGNOSIS — Z7901 Long term (current) use of anticoagulants: Secondary | ICD-10-CM

## 2017-01-17 DIAGNOSIS — I480 Paroxysmal atrial fibrillation: Secondary | ICD-10-CM | POA: Diagnosis not present

## 2017-01-17 DIAGNOSIS — I5032 Chronic diastolic (congestive) heart failure: Secondary | ICD-10-CM | POA: Diagnosis not present

## 2017-01-17 DIAGNOSIS — Z950 Presence of cardiac pacemaker: Secondary | ICD-10-CM | POA: Diagnosis not present
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
## 2017-01-17 NOTE — Patient Instructions (Signed)

## 2017-01-30 IMAGING — CR DG CHEST 2V
2 series · 2 of 2 positions shown · non-contrast
Comparison: 07/09/2014.

CLINICAL DATA: Asthma.  Shortness of breath.

EXAM:
CHEST  2 VIEW

[w chest pa]
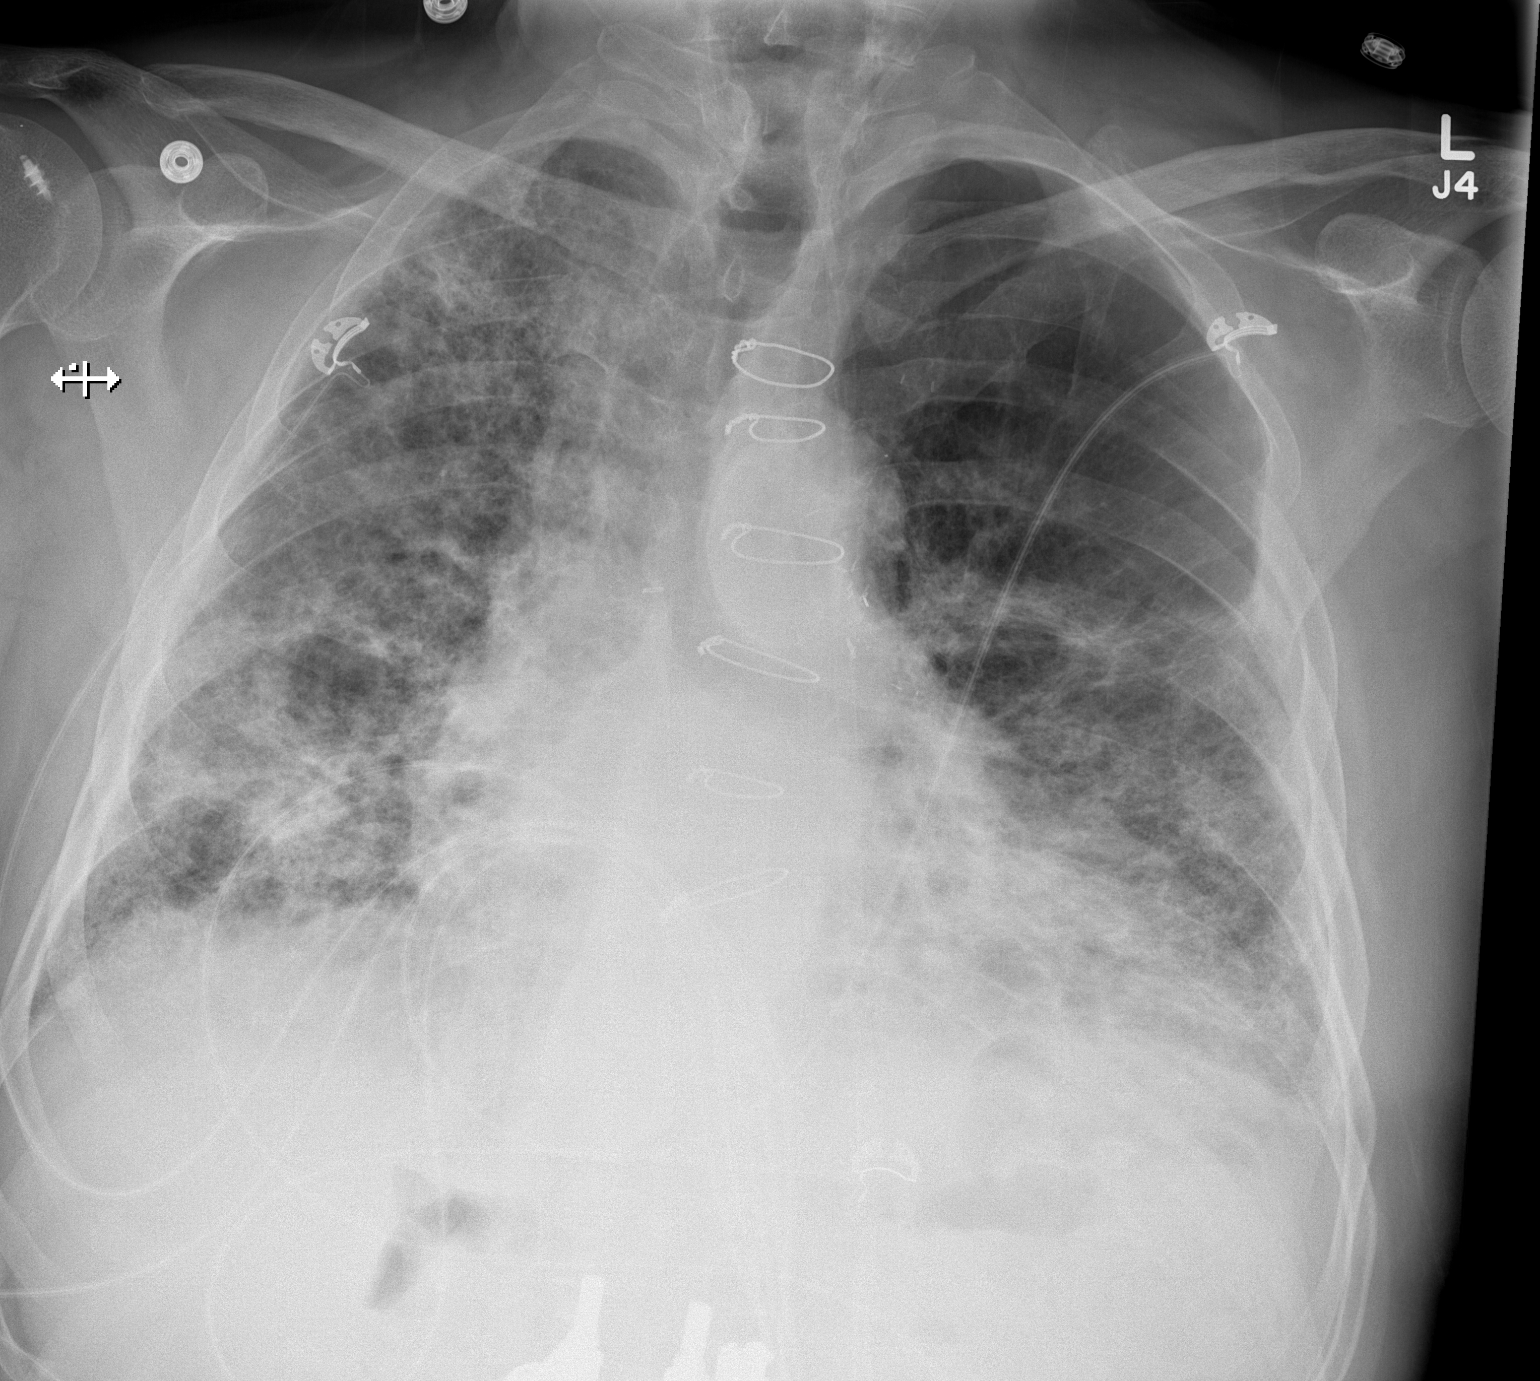

[w chest lat]
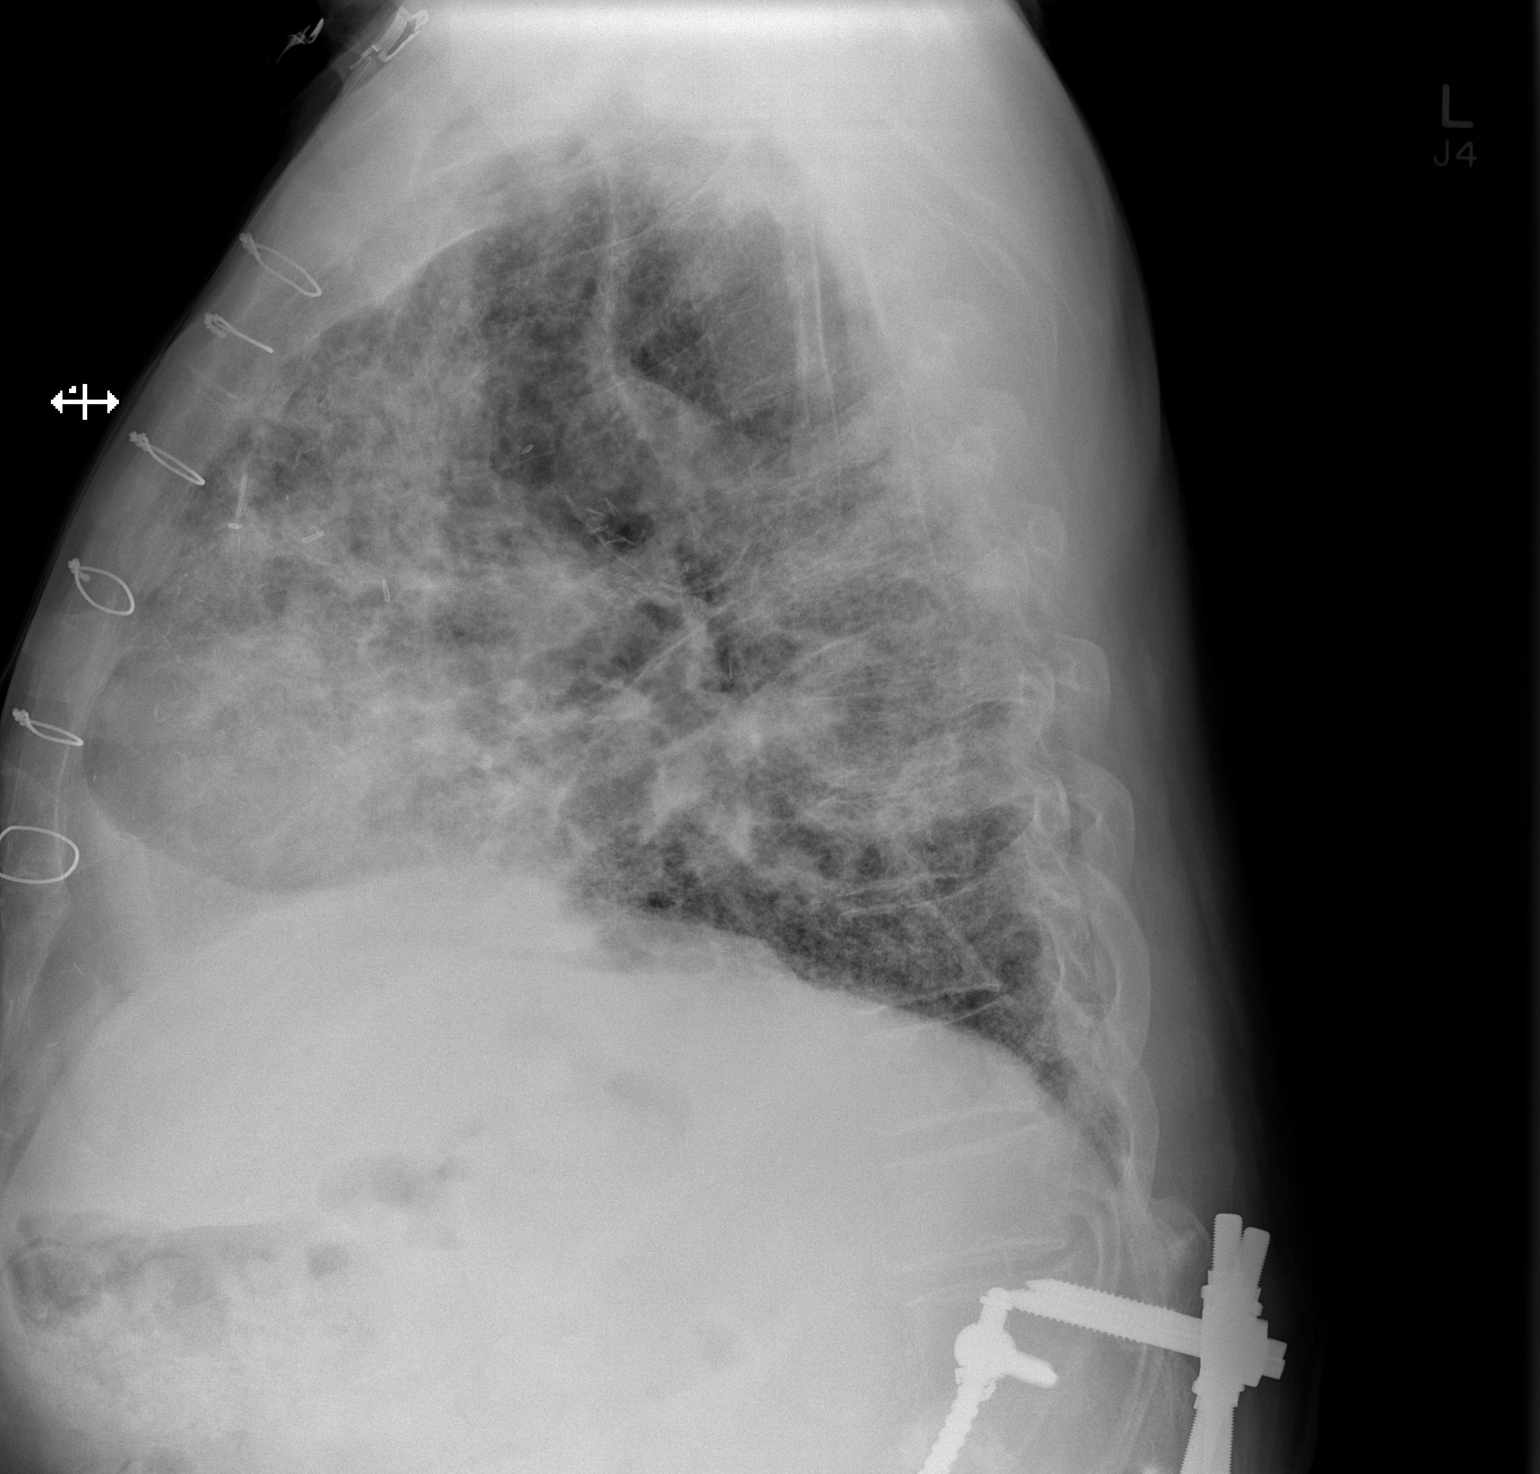

[2 of 2 positions shown; findings below may reference images not displayed]

FINDINGS: Mediastinum and hilar structures are normal. Prior CABG. Heart size
stable. Persistent diffuse bilateral pulmonary infiltrates are
noted. No significant pleural effusion or pneumothorax. Prior lumbar
spine fusion.
IMPRESSION: 1. Persistent bilateral pulmonary infiltrates.
2. Prior CABG.  Stable heart size.  No pulmonary venous congestion .

## 2017-02-06 IMAGING — CR DG CHEST 2V
2 series · 2 of 2 positions shown · non-contrast
Comparison: [DATE] [DATE], [DATE], [DATE] [DATE], [DATE], and April 25, 2014 chest radiographs

CLINICAL DATA: Emphysema and chronic asthma

EXAM:
CHEST  2 VIEW

[view not recorded (1 of 2)]
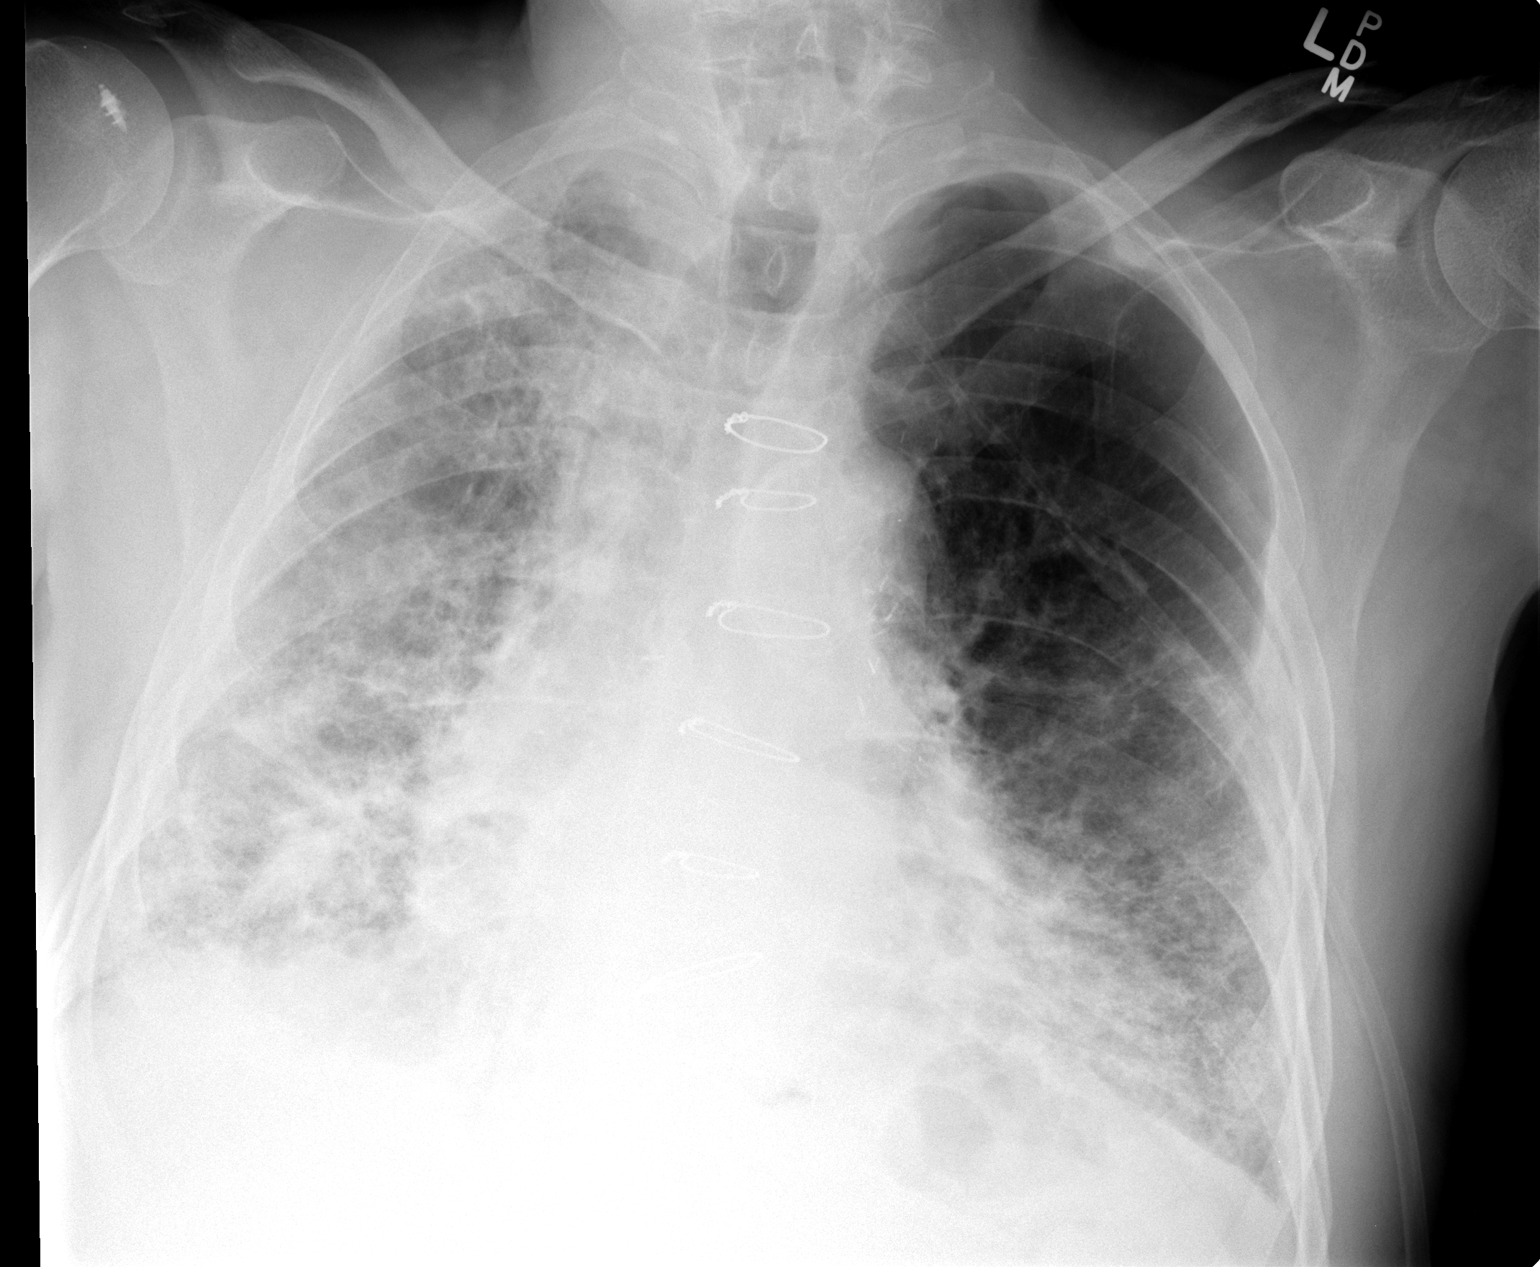

[view not recorded (2 of 2)]
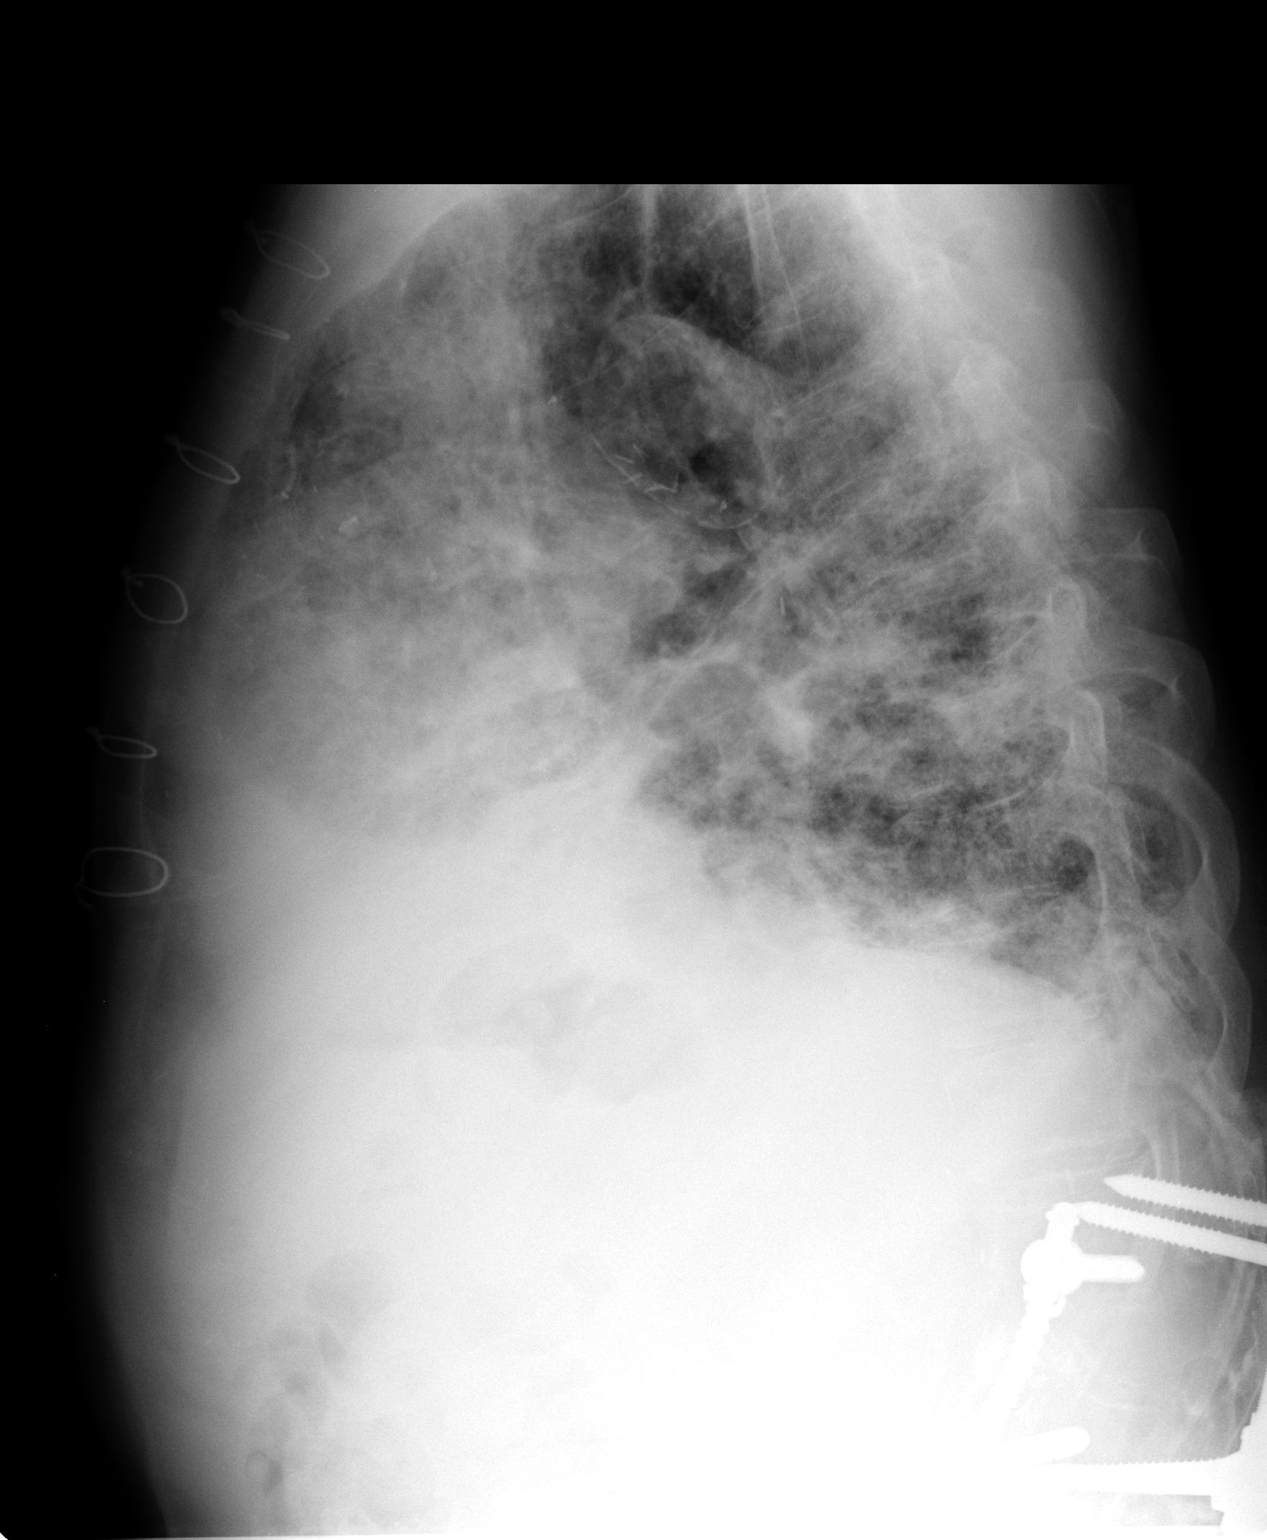

[2 of 2 positions shown; findings below may reference images not displayed]

FINDINGS: There is underlying emphysematous change. There is airspace
consolidation throughout much of the right lung as well as the left
mid and lower lung zone regions. These changes are stable compared
to most recent prior chest radiograph but have progressed over time.
Heart is borderline enlarged with pulmonary vascularity within
normal limits. Patient is status post internal mammary bypass
grafting. No adenopathy. There is evidence of either old trauma or
postoperative change involving the left fourth rib, stable.
IMPRESSION: Underlying emphysema with extensive underlying fibrotic type change.
Increased opacity in portions of the right lung, particularly in the
upper lobe as well as in portions of the left mid lung in left base,
may represent a degree of superimposed pneumonia which shows
progression over time but has not changed significantly compared to
most recent prior study. No change in cardiac silhouette.

## 2017-02-09 DIAGNOSIS — J449 Chronic obstructive pulmonary disease, unspecified: Secondary | ICD-10-CM | POA: Diagnosis not present

## 2017-02-12 ENCOUNTER — Telehealth: Payer: Self-pay | Admitting: Cardiology

## 2017-02-12 ENCOUNTER — Other Ambulatory Visit: Payer: Self-pay

## 2017-02-12 MED ORDER — RANOLAZINE ER 500 MG PO TB12: 500.0000 mg | ORAL_TABLET | Freq: Two times a day (BID) | ORAL | 3 refills | Status: AC

## 2017-02-12 MED ORDER — RANOLAZINE ER 500 MG PO TB12: 500.0000 mg | ORAL_TABLET | Freq: Two times a day (BID) | ORAL | 3 refills | Status: DC

## 2017-02-12 NOTE — Telephone Encounter (Signed)
Prescription printed and office note. [facility]

## 2017-02-12 NOTE — Telephone Encounter (Signed)
Patient wants his medication ([place]) faxed to the [place] to [phone] Attn: Dr Felton Clinton Use last 4 digits on script [phone] alos please include last office visits.Marland KitchenMarland Kitchen

## 2017-02-28 DIAGNOSIS — Z6831 Body mass index (BMI) 31.0-31.9, adult: Secondary | ICD-10-CM | POA: Diagnosis not present

## 2017-02-28 DIAGNOSIS — R05 Cough: Secondary | ICD-10-CM | POA: Diagnosis not present

## 2017-02-28 DIAGNOSIS — J439 Emphysema, unspecified: Secondary | ICD-10-CM | POA: Diagnosis not present

## 2017-02-28 DIAGNOSIS — L853 Xerosis cutis: Secondary | ICD-10-CM | POA: Diagnosis not present

## 2017-03-11 DIAGNOSIS — Z23 Encounter for immunization: Secondary | ICD-10-CM | POA: Diagnosis not present

## 2017-03-11 DIAGNOSIS — J449 Chronic obstructive pulmonary disease, unspecified: Secondary | ICD-10-CM | POA: Diagnosis not present

## 2017-03-12 DIAGNOSIS — M4722 Other spondylosis with radiculopathy, cervical region: Secondary | ICD-10-CM | POA: Diagnosis not present

## 2017-03-12 DIAGNOSIS — G8922 Chronic post-thoracotomy pain: Secondary | ICD-10-CM | POA: Diagnosis not present

## 2017-03-12 DIAGNOSIS — M47816 Spondylosis without myelopathy or radiculopathy, lumbar region: Secondary | ICD-10-CM | POA: Diagnosis not present

## 2017-03-12 DIAGNOSIS — M150 Primary generalized (osteo)arthritis: Secondary | ICD-10-CM | POA: Diagnosis not present

## 2017-03-12 DIAGNOSIS — G894 Chronic pain syndrome: Secondary | ICD-10-CM | POA: Diagnosis not present

## 2017-03-12 DIAGNOSIS — Z79891 Long term (current) use of opiate analgesic: Secondary | ICD-10-CM | POA: Diagnosis not present

## 2017-03-13 DIAGNOSIS — Z9181 History of falling: Secondary | ICD-10-CM | POA: Diagnosis not present

## 2017-03-13 DIAGNOSIS — R258 Other abnormal involuntary movements: Secondary | ICD-10-CM | POA: Diagnosis not present

## 2017-03-13 DIAGNOSIS — N400 Enlarged prostate without lower urinary tract symptoms: Secondary | ICD-10-CM | POA: Diagnosis not present

## 2017-03-13 DIAGNOSIS — Z6829 Body mass index (BMI) 29.0-29.9, adult: Secondary | ICD-10-CM | POA: Diagnosis not present

## 2017-03-13 DIAGNOSIS — K219 Gastro-esophageal reflux disease without esophagitis: Secondary | ICD-10-CM | POA: Diagnosis not present

## 2017-03-13 DIAGNOSIS — E782 Mixed hyperlipidemia: Secondary | ICD-10-CM | POA: Diagnosis not present

## 2017-03-13 DIAGNOSIS — I4891 Unspecified atrial fibrillation: Secondary | ICD-10-CM | POA: Diagnosis not present

## 2017-03-13 DIAGNOSIS — I10 Essential (primary) hypertension: Secondary | ICD-10-CM | POA: Diagnosis not present

## 2017-03-13 DIAGNOSIS — Z79899 Other long term (current) drug therapy: Secondary | ICD-10-CM | POA: Diagnosis not present

## 2017-03-13 DIAGNOSIS — G4730 Sleep apnea, unspecified: Secondary | ICD-10-CM | POA: Diagnosis not present

## 2017-03-13 DIAGNOSIS — J439 Emphysema, unspecified: Secondary | ICD-10-CM | POA: Diagnosis not present

## 2017-03-13 DIAGNOSIS — E559 Vitamin D deficiency, unspecified: Secondary | ICD-10-CM | POA: Diagnosis not present

## 2017-03-20 IMAGING — CR DG CHEST 2V
2 series · 2 of 2 positions shown · non-contrast
Comparison: PA and lateral chest of July 19, 2014

CLINICAL DATA: History of emphysema, CABG, pulmonary fibrosis, and
thoracic aortic aneurysm.

EXAM:
CHEST  2 VIEW

[view not recorded (1 of 2)]
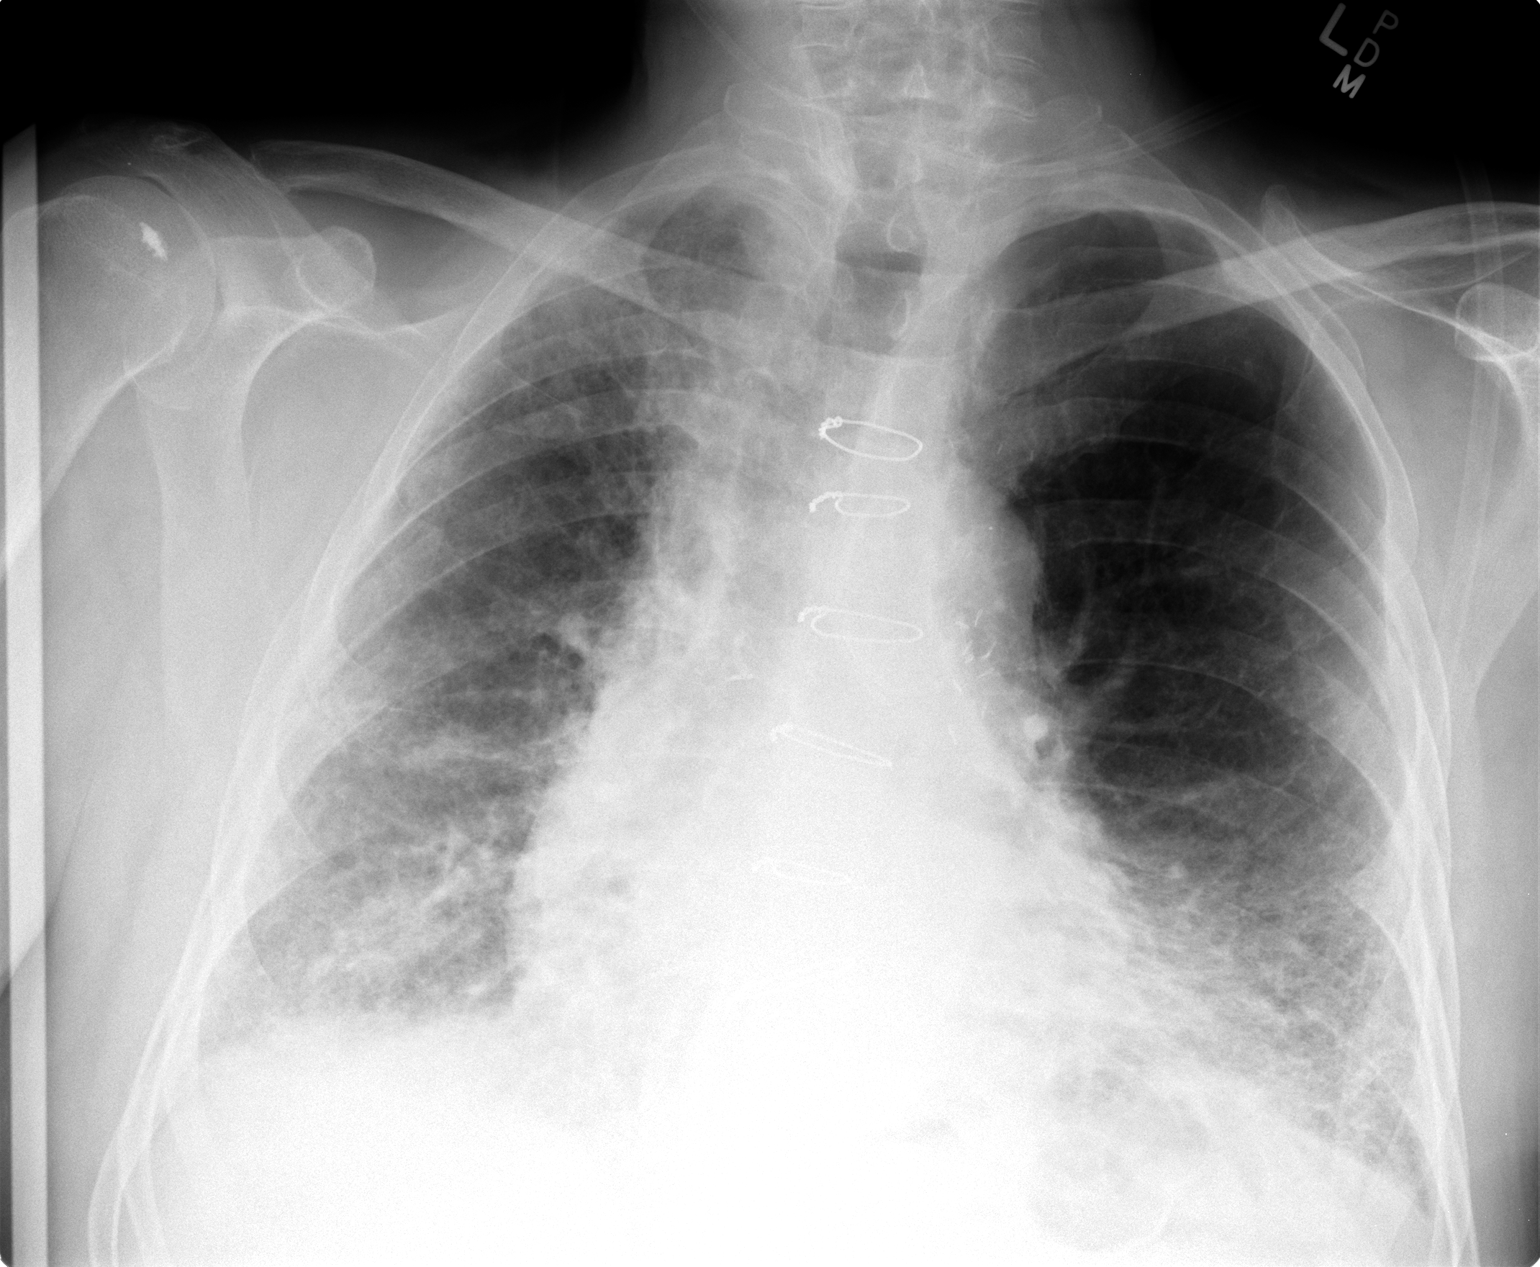

[view not recorded (2 of 2)]
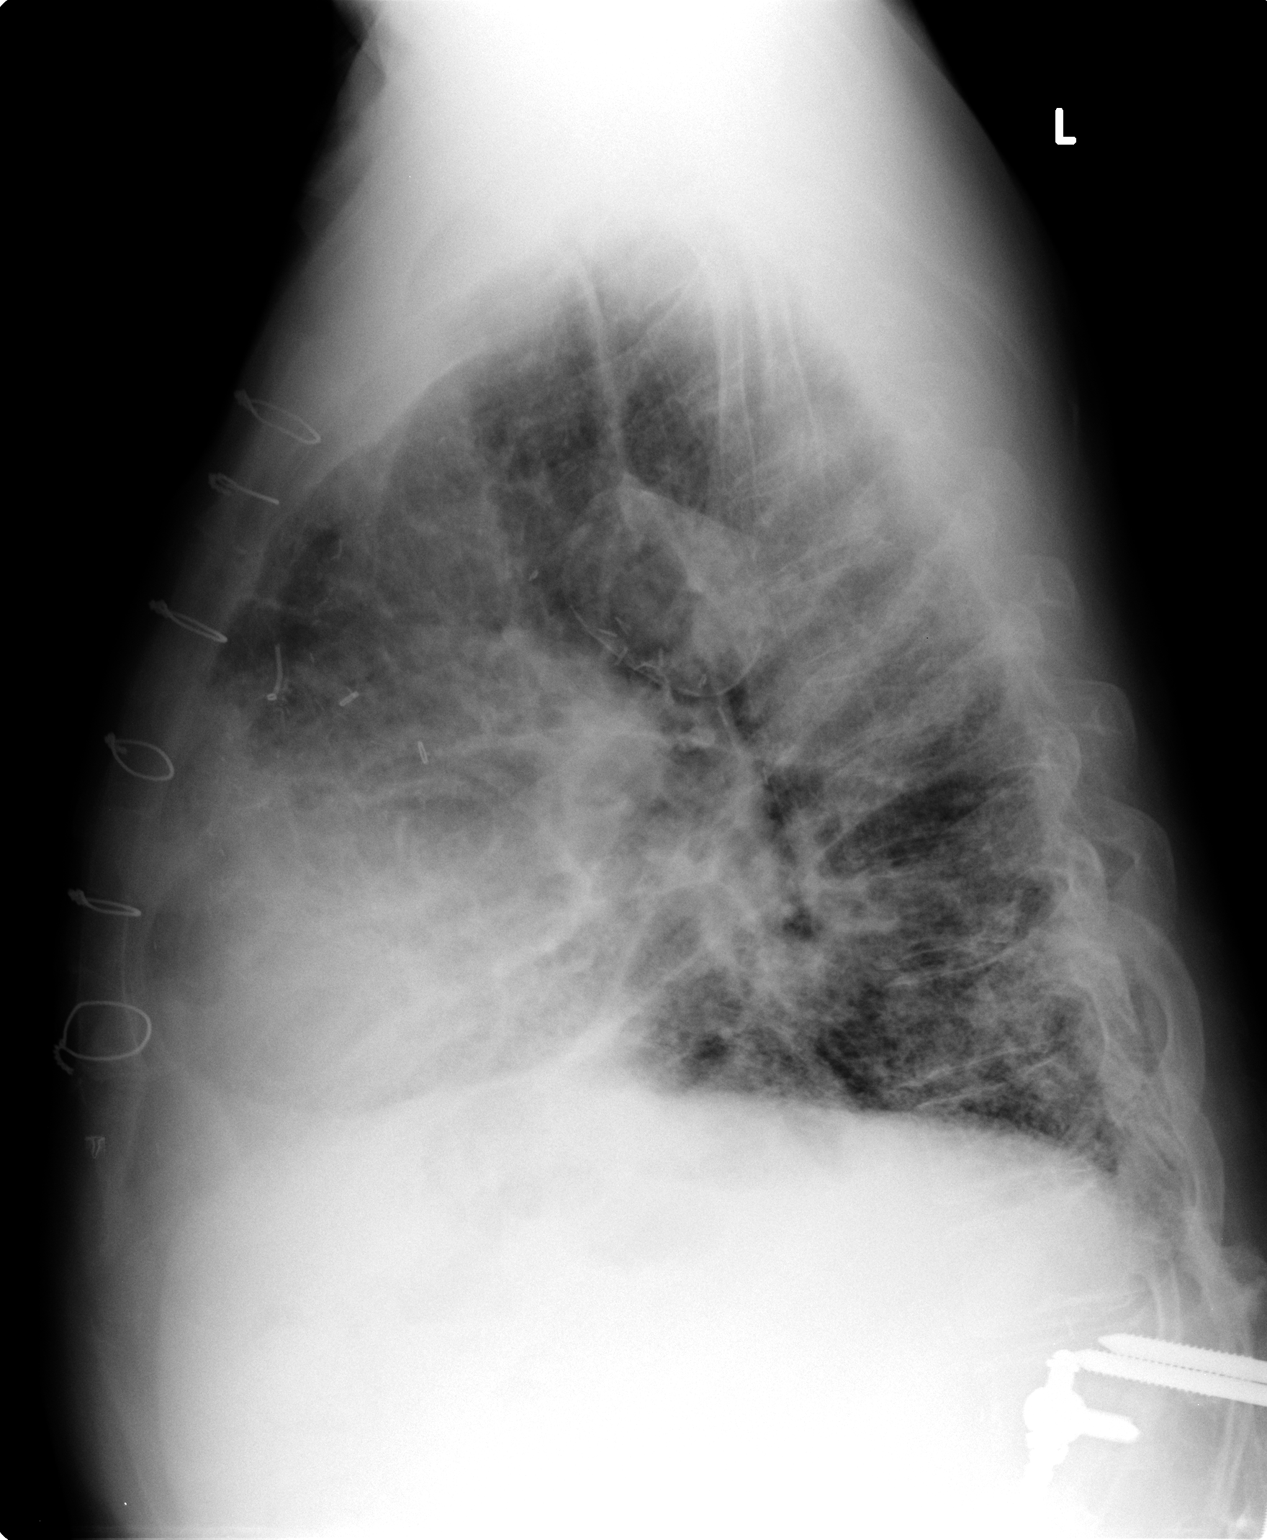

[2 of 2 positions shown; findings below may reference images not displayed]

FINDINGS: The lungs are well-expanded. There has been overall improvement in
the appearance of the pulmonary interstitium which may indicate
resolution of recent episode of pneumonia. Fibrotic changes persists
throughout the right lung and at the left lung base. The left mid
and upper lung are relatively hyperlucent. The cardiac silhouette is
top-normal in size. The pulmonary vascularity is not engorged. There
is no pleural effusion. There are postsurgical changes associated
with the proximal thoracic aorta. There are post CABG changes as
well. The bony thorax exhibits no acute abnormality.
IMPRESSION: Interval improvement in the appearance of the pulmonary interstitium
may reflect resolution of pneumonia. There remain emphysematous and
fibrotic changes in both lungs. There is no evidence of CHF
currently.

## 2017-04-08 DIAGNOSIS — Z1212 Encounter for screening for malignant neoplasm of rectum: Secondary | ICD-10-CM | POA: Diagnosis not present

## 2017-04-08 DIAGNOSIS — Z1211 Encounter for screening for malignant neoplasm of colon: Secondary | ICD-10-CM | POA: Diagnosis not present

## 2017-04-11 DIAGNOSIS — J449 Chronic obstructive pulmonary disease, unspecified: Secondary | ICD-10-CM | POA: Diagnosis not present

## 2017-04-12 DIAGNOSIS — Z1211 Encounter for screening for malignant neoplasm of colon: Secondary | ICD-10-CM | POA: Diagnosis not present

## 2017-04-12 DIAGNOSIS — E785 Hyperlipidemia, unspecified: Secondary | ICD-10-CM | POA: Diagnosis not present

## 2017-04-12 DIAGNOSIS — Z1331 Encounter for screening for depression: Secondary | ICD-10-CM | POA: Diagnosis not present

## 2017-04-12 DIAGNOSIS — Z6831 Body mass index (BMI) 31.0-31.9, adult: Secondary | ICD-10-CM | POA: Diagnosis not present

## 2017-04-12 DIAGNOSIS — Z125 Encounter for screening for malignant neoplasm of prostate: Secondary | ICD-10-CM | POA: Diagnosis not present

## 2017-04-12 DIAGNOSIS — Z9181 History of falling: Secondary | ICD-10-CM | POA: Diagnosis not present

## 2017-04-12 DIAGNOSIS — Z0000 Encounter for general adult medical examination without abnormal findings: Secondary | ICD-10-CM | POA: Diagnosis not present

## 2017-05-07 DIAGNOSIS — G8922 Chronic post-thoracotomy pain: Secondary | ICD-10-CM | POA: Diagnosis not present

## 2017-05-07 DIAGNOSIS — Z79891 Long term (current) use of opiate analgesic: Secondary | ICD-10-CM | POA: Diagnosis not present

## 2017-05-07 DIAGNOSIS — M4722 Other spondylosis with radiculopathy, cervical region: Secondary | ICD-10-CM | POA: Diagnosis not present

## 2017-05-07 DIAGNOSIS — M47816 Spondylosis without myelopathy or radiculopathy, lumbar region: Secondary | ICD-10-CM | POA: Diagnosis not present

## 2017-05-11 DIAGNOSIS — J449 Chronic obstructive pulmonary disease, unspecified: Secondary | ICD-10-CM | POA: Diagnosis not present
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
## 2017-06-27 ENCOUNTER — Encounter: Payer: Self-pay | Admitting: Cardiology

## 2017-06-27 ENCOUNTER — Ambulatory Visit (INDEPENDENT_AMBULATORY_CARE_PROVIDER_SITE_OTHER): Payer: PPO | Admitting: Cardiology

## 2017-06-27 ENCOUNTER — Other Ambulatory Visit: Payer: Self-pay

## 2017-06-27 VITALS — BP 110/66 | HR 80 | Ht 74.0 in | Wt 235.0 lb

## 2017-06-27 DIAGNOSIS — Z950 Presence of cardiac pacemaker: Secondary | ICD-10-CM

## 2017-06-27 DIAGNOSIS — I5032 Chronic diastolic (congestive) heart failure: Secondary | ICD-10-CM

## 2017-06-27 DIAGNOSIS — I480 Paroxysmal atrial fibrillation: Secondary | ICD-10-CM

## 2017-06-27 DIAGNOSIS — Z7901 Long term (current) use of anticoagulants: Secondary | ICD-10-CM | POA: Diagnosis not present

## 2017-06-27 DIAGNOSIS — Z951 Presence of aortocoronary bypass graft: Secondary | ICD-10-CM | POA: Diagnosis not present

## 2017-06-27 DIAGNOSIS — Z79899 Other long term (current) drug therapy: Secondary | ICD-10-CM | POA: Diagnosis not present

## 2017-06-27 NOTE — Patient Instructions (Addendum)
Medication Instructions:  Your physician recommends that you continue on your current medications as directed. Please refer to the Current Medication list given to you today.  Labwork: None ordered  Testing/Procedures: None ordered  Follow-Up: Your physician recommends that you schedule a follow-up appointment in: In July with Dr. Bettina Gavia in [place]   Any Other Special Instructions Will Be Listed Below (If Applicable).  Discontinue tour eliquis 3 days = 6 doses prior to colonoscopy Ask Dr Melina Copa  When to resume eliquis, generally 24-48 hrs  If you need a refill on your cardiac medications before your next appointment, please call your pharmacy.

## 2017-07-01 ENCOUNTER — Telehealth: Payer: Self-pay

## 2017-07-01 NOTE — Telephone Encounter (Signed)
Pt notified to stop Amiodarone per Dr Bettina Gavia.   Pt advised to see Dr Curt Bears per Dr Bettina Gavia.  He will be scheduled to see Dr Curt Bears.  Pt is aware.

## 2017-07-01 NOTE — Telephone Encounter (Signed)
-----   Message from Richardo Priest, MD sent at 07/01/2017  9:42 AM EST ----- I received the chest CT from [place] to stop his amiodarone  Needs to see EP re alternate therapy, I would prefer [place] but could see EP in [name] as they manage his pacemaker presently

## 2017-07-01 NOTE — Addendum Note (Signed)
Addended by: Stevan Born on: 07/01/2017 02:29 PM   Modules accepted: Orders

## 2017-07-01 NOTE — Telephone Encounter (Signed)
Left message to return call 

## 2017-07-02 DIAGNOSIS — G8922 Chronic post-thoracotomy pain: Secondary | ICD-10-CM | POA: Diagnosis not present

## 2017-07-02 DIAGNOSIS — Z79891 Long term (current) use of opiate analgesic: Secondary | ICD-10-CM | POA: Diagnosis not present

## 2017-07-02 DIAGNOSIS — M47816 Spondylosis without myelopathy or radiculopathy, lumbar region: Secondary | ICD-10-CM | POA: Diagnosis not present

## 2017-07-02 DIAGNOSIS — M4722 Other spondylosis with radiculopathy, cervical region: Secondary | ICD-10-CM | POA: Diagnosis not present

## 2017-07-05 ENCOUNTER — Ambulatory Visit: Payer: PPO | Admitting: Cardiology

## 2017-07-05 ENCOUNTER — Encounter: Payer: Self-pay | Admitting: Cardiology

## 2017-07-05 VITALS — BP 110/62 | HR 65 | Ht 74.0 in | Wt 235.0 lb

## 2017-07-05 DIAGNOSIS — I495 Sick sinus syndrome: Secondary | ICD-10-CM | POA: Diagnosis not present

## 2017-07-05 DIAGNOSIS — I480 Paroxysmal atrial fibrillation: Secondary | ICD-10-CM | POA: Diagnosis not present

## 2017-07-05 DIAGNOSIS — I25810 Atherosclerosis of coronary artery bypass graft(s) without angina pectoris: Secondary | ICD-10-CM | POA: Diagnosis not present

## 2017-07-05 DIAGNOSIS — I5032 Chronic diastolic (congestive) heart failure: Secondary | ICD-10-CM | POA: Diagnosis not present

## 2017-07-05 DIAGNOSIS — I10 Essential (primary) hypertension: Secondary | ICD-10-CM | POA: Diagnosis not present

## 2017-07-05 LAB — CUP PACEART INCLINIC DEVICE CHECK
Date Time Interrogation Session: 20190125111304
Implantable Lead Implant Date: 20160412
Implantable Lead Location: 753860
Implantable Lead Model: 350
Implantable Lead Serial Number: 29619713
Lead Channel Impedance Value: 468 Ohm
Lead Channel Pacing Threshold Amplitude: 0.8 V
Lead Channel Pacing Threshold Amplitude: 0.8 V
Lead Channel Pacing Threshold Amplitude: 1.6 V
Lead Channel Pacing Threshold Amplitude: 1.6 V
Lead Channel Pacing Threshold Pulse Width: 0.4 ms
Lead Channel Pacing Threshold Pulse Width: 0.4 ms
Lead Channel Pacing Threshold Pulse Width: 0.4 ms
Lead Channel Pacing Threshold Pulse Width: 0.4 ms
MDC IDC LEAD IMPLANT DT: 20160412
MDC IDC LEAD LOCATION: 753859
MDC IDC LEAD SERIAL: 29758228
MDC IDC MSMT LEADCHNL RA IMPEDANCE VALUE: 585 Ohm
MDC IDC MSMT LEADCHNL RA SENSING INTR AMPL: 5.8 mV
MDC IDC MSMT LEADCHNL RA SENSING INTR AMPL: 5.9 mV
MDC IDC MSMT LEADCHNL RV PACING THRESHOLD AMPLITUDE: 1.5 V
MDC IDC MSMT LEADCHNL RV PACING THRESHOLD PULSEWIDTH: 0.4 ms
MDC IDC MSMT LEADCHNL RV SENSING INTR AMPL: 5.9 mV
MDC IDC MSMT LEADCHNL RV SENSING INTR AMPL: 6.1 mV
MDC IDC PG IMPLANT DT: 20160412
MDC IDC PG SERIAL: 68477892
MDC IDC SET LEADCHNL RA PACING AMPLITUDE: 2.4 V
MDC IDC SET LEADCHNL RV PACING AMPLITUDE: 2.4 V
MDC IDC SET LEADCHNL RV PACING PULSEWIDTH: 0.4 ms

## 2017-07-05 NOTE — Addendum Note (Signed)
Addended by: Sandrea Hammond D on: 07/05/2017 11:20 AM   Modules accepted: Orders
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
## 2017-07-05 NOTE — Patient Instructions (Signed)
Medication Instructions:    Your physician recommends that you continue on your current medications as directed. Please refer to the Current Medication list given to you today.  - If you need a refill on your cardiac medications before your next appointment, please call your pharmacy.   Labwork:  None ordered  Testing/Procedures:  None ordered  Follow-Up:  Your physician recommends that you schedule a follow-up appointment in: 3 months with Dr. Camnitz.  Thank you for choosing [facility]!!   Sary Bogie, RN [phone]         

## 2017-07-19 ENCOUNTER — Encounter: Payer: Self-pay | Admitting: Cardiology

## 2017-07-29 DIAGNOSIS — Z86010 Personal history of colonic polyps: Secondary | ICD-10-CM | POA: Diagnosis not present

## 2017-07-29 DIAGNOSIS — Z1211 Encounter for screening for malignant neoplasm of colon: Secondary | ICD-10-CM | POA: Diagnosis not present

## 2017-07-29 DIAGNOSIS — K635 Polyp of colon: Secondary | ICD-10-CM | POA: Diagnosis not present

## 2017-07-29 DIAGNOSIS — D124 Benign neoplasm of descending colon: Secondary | ICD-10-CM | POA: Diagnosis not present

## 2017-08-05 NOTE — Progress Notes (Signed)
Established Thoracic Aortic Repair  History of Present Illness   Grant Santos is a 72 y.o. (06-16-1945) male who presents with chief complaint: shortness of breath.  Patient previously had a chronically thrombosed PSA at the likely proximal suture line of the patient's previous thoracic aortic repair.  I did not feel this was the etiology of this patient's thromboembolism which required a L brachial and ulnar TE.  Pt was previously medically unstable so no further intervention was planned.  Patient has been in the [place] system for care.  Recently he had a CT chest without contrast to evaluate for lung parenchymal changes related to Amiodarone toxicity.   The patient's PMH, PSH, SH, and FamHx are unchanged from 10/22/14.  Current Outpatient Medications  Medication Sig Dispense Refill  . acetaminophen (TYLENOL) 325 MG tablet Take 650 mg by mouth every 8 (eight) hours.     Marland Kitchen albuterol (PROVENTIL HFA;VENTOLIN HFA) 108 (90 BASE) MCG/ACT inhaler Inhale 2 puffs into the lungs every 6 (six) hours as needed for shortness of breath. 1 Inhaler 6  . albuterol (PROVENTIL) (2.5 MG/3ML) 0.083% nebulizer solution USE 1 VIAL VIA NEBULIZER 4 TIMES DAILY  11  . apixaban (ELIQUIS) 5 MG TABS tablet Take 2 tablets (10 mg total) by mouth 2 (two) times daily. Take 2 tablets (10mg ) 2 times daily x 3 days, then 1 tablet (5mg ) 2 times daily starting 07/16/14. (Patient taking differently: Take 5 mg by mouth 2 (two) times daily. ) 90 tablet 0  . atorvastatin (LIPITOR) 40 MG tablet Take 40 mg by mouth at bedtime.  2  . bethanechol (URECHOLINE) 25 MG tablet Take 25 mg by mouth 3 (three) times daily.  6  . budesonide-formoterol (SYMBICORT) 160-4.5 MCG/ACT inhaler Inhale 2 puffs into the lungs 2 (two) times daily.    . clopidogrel (PLAVIX) 75 MG tablet Take 75 mg by mouth daily.     Marland Kitchen diltiazem (TIAZAC) 360 MG 24 hr capsule Take 360 mg by mouth daily.    Marland Kitchen donepezil (ARICEPT) 10 MG tablet Take 1 tablet by mouth daily.    Marland Kitchen  esomeprazole (NEXIUM) 20 MG capsule Take 20 mg by mouth daily at 12 noon.    . ferrous sulfate 325 (65 FE) MG tablet Take 325 mg by mouth daily with breakfast.    . Fish Oil-Cholecalciferol (FISH OIL + D3) 1000-1000 MG-UNIT CAPS Take 1 capsule by mouth daily.    . furosemide (LASIX) 40 MG tablet Take 40 mg by mouth every morning.     . gabapentin (NEURONTIN) 800 MG tablet Take 800 mg by mouth every 6 (six) hours.     Marland Kitchen glucosamine-chondroitin 500-400 MG tablet Take 1 tablet by mouth daily.     Marland Kitchen guaiFENesin (MUCINEX) 600 MG 12 hr tablet Take 1,200 mg by mouth daily.     . montelukast (SINGULAIR) 10 MG tablet Take 10 mg by mouth at bedtime.    . Multiple Vitamin (MULTIVITAMIN) capsule Take 1 capsule by mouth daily.      . nitroGLYCERIN (NITROSTAT) 0.4 MG SL tablet Place 0.4 mg under the tongue every 5 (five) minutes as needed.      Marland Kitchen omeprazole (PRILOSEC) 20 MG capsule Take 20 mg by mouth daily.    Marland Kitchen oxycodone (ROXICODONE) 30 MG immediate release tablet Take 1 tablet (30 mg total) by mouth every 4 (four) hours as needed for pain. 20 tablet 0  . polyethylene glycol (MIRALAX / GLYCOLAX) packet Take 17 g by mouth daily.     Marland Kitchen  promethazine (PHENERGAN) 25 MG tablet Take 25 mg by mouth every 6 (six) hours as needed.     . ranolazine (RANEXA) 500 MG 12 hr tablet Take 1 tablet (500 mg total) by mouth 2 (two) times daily. 180 tablet 3  . terazosin (HYTRIN) 1 MG capsule Take 4 mg by mouth at bedtime.    Marland Kitchen tiZANidine (ZANAFLEX) 2 MG tablet Take 2 mg by mouth every morning.     . topiramate (TOPAMAX) 25 MG tablet Take 25 mg by mouth daily.    . TUDORZA PRESSAIR 400 MCG/ACT AEPB Inhale 1 puff into the lungs 2 (two) times daily. 3 each 1  . Vitamin D, Ergocalciferol, (DRISDOL) 50000 units CAPS capsule Take 50,000 Units by mouth every 7 (seven) days.     No current facility-administered medications for this visit.     On ROS today: SOB, no left arm sx   Physical Examination   Vitals:   08/07/17 1004    BP: 126/73  Pulse: 80  Resp: 20  Temp: (!) 97.3 F (36.3 C)  TempSrc: Oral  SpO2: 93%  Weight: 235 lb (106.6 kg)  Height: 6\' 2"  (1.88 m)   Body mass index is 30.17 kg/m.  General Alert, O x 3, WD, Ill appearing  Pulmonary Sym exp, Decreased B air movt, rales on B  Cardiac RRR, Nl S1, S2, no Murmurs, No rubs, No S3,S4  Vascular Vessel Right Left  Radial Palpable Not palpable  Brachial Palpable Palpable  Carotid Palpable, No Bruit Palpable, No Bruit  Aorta Not palpable N/A  Femoral Palpable Palpable  Popliteal Not palpable Not palpable  PT Not palpable BKA  DP Not palpable BKA    Gastro- intestinal soft, non-distended, non-tender to palpation, No guarding or rebound, no HSM, no masses, no CVAT B, No palpable prominent aortic pulse,    Musculo- skeletal M/S 5/5 throughout except L BKA, Extremities without ischemic changes  , No edema present, No visible varicosities , No Lipodermatosclerosis present  Neurologic Pain and light touch intact in extremities , Motor exam as listed above    Medical Decision Making   Grant Santos is a 72 y.o. (1946-03-31) male who presents with: s/p L brachial and ulnar TE, known thoracic aortic PSA, decompensated COPD, s/p prior thoracic aortic repair   CTA Chest will be obtained as the CT Chest without contrast is inadequate to evaluate the thoracic aorta.  Patient will follow up in next 2-4 weeks once the CTA is obtained.  Thank you for allowing [place] to participate in this patient's care.   Adele Barthel, MD, FACS [facility] Office: [phone] Pager: [phone]

## 2017-08-07 ENCOUNTER — Other Ambulatory Visit: Payer: Self-pay

## 2017-08-07 ENCOUNTER — Encounter: Payer: Self-pay | Admitting: Vascular Surgery

## 2017-08-07 ENCOUNTER — Ambulatory Visit (INDEPENDENT_AMBULATORY_CARE_PROVIDER_SITE_OTHER): Payer: Non-veteran care | Admitting: Vascular Surgery

## 2017-08-07 DIAGNOSIS — T82390D Other mechanical complication of aortic (bifurcation) graft (replacement), subsequent encounter: Secondary | ICD-10-CM

## 2017-08-07 DIAGNOSIS — I719 Aortic aneurysm of unspecified site, without rupture: Secondary | ICD-10-CM | POA: Diagnosis not present

## 2017-08-07 DIAGNOSIS — Z8679 Personal history of other diseases of the circulatory system: Secondary | ICD-10-CM

## 2017-08-07 DIAGNOSIS — Z01812 Encounter for preprocedural laboratory examination: Secondary | ICD-10-CM

## 2017-08-14 DIAGNOSIS — I4891 Unspecified atrial fibrillation: Secondary | ICD-10-CM | POA: Diagnosis not present

## 2017-08-14 DIAGNOSIS — R258 Other abnormal involuntary movements: Secondary | ICD-10-CM | POA: Diagnosis not present

## 2017-08-14 DIAGNOSIS — I712 Thoracic aortic aneurysm, without rupture: Secondary | ICD-10-CM | POA: Diagnosis not present

## 2017-08-14 DIAGNOSIS — E669 Obesity, unspecified: Secondary | ICD-10-CM | POA: Diagnosis not present

## 2017-08-14 DIAGNOSIS — Z6832 Body mass index (BMI) 32.0-32.9, adult: Secondary | ICD-10-CM | POA: Diagnosis not present

## 2017-08-14 DIAGNOSIS — J439 Emphysema, unspecified: Secondary | ICD-10-CM | POA: Diagnosis not present

## 2017-08-14 DIAGNOSIS — R600 Localized edema: Secondary | ICD-10-CM | POA: Diagnosis not present

## 2017-08-14 DIAGNOSIS — E782 Mixed hyperlipidemia: Secondary | ICD-10-CM | POA: Diagnosis not present

## 2017-08-14 DIAGNOSIS — I10 Essential (primary) hypertension: Secondary | ICD-10-CM | POA: Diagnosis not present

## 2017-08-14 DIAGNOSIS — Z79899 Other long term (current) drug therapy: Secondary | ICD-10-CM | POA: Diagnosis not present

## 2017-08-14 DIAGNOSIS — G4730 Sleep apnea, unspecified: Secondary | ICD-10-CM | POA: Diagnosis not present

## 2017-08-29 NOTE — Progress Notes (Signed)
Established Thoracic Aortic Repair  History of Present Illness   Grant Santos is a 72 y.o. (06-30-1945) male who presents with chief complaint: continue SOB.  Patient previously had a chronically thrombosed PSA at the likely proximal suture line of the patient's previous thoracic aortic repair.  I did not feel this was the etiology of this patient's thromboembolism which required a L brachial and ulnar TE.  Pt was previously medically unstable so no further intervention was planned.  Patient has been in the [place] system for care.  Recently he had a CT chest without contrast to evaluate for lung parenchymal changes related to Amiodarone toxicity.  The patient's SOB is unchanged.  The patient's PMH, PSH, SH, and FamHx are unchanged from 10/22/14.  Current Outpatient Medications  Medication Sig Dispense Refill  . acetaminophen (TYLENOL) 325 MG tablet Take 650 mg by mouth every 8 (eight) hours.     Marland Kitchen albuterol (PROVENTIL HFA;VENTOLIN HFA) 108 (90 BASE) MCG/ACT inhaler Inhale 2 puffs into the lungs every 6 (six) hours as needed for shortness of breath. 1 Inhaler 6  . albuterol (PROVENTIL) (2.5 MG/3ML) 0.083% nebulizer solution USE 1 VIAL VIA NEBULIZER 4 TIMES DAILY  11  . apixaban (ELIQUIS) 5 MG TABS tablet Take 2 tablets (10 mg total) by mouth 2 (two) times daily. Take 2 tablets (10mg ) 2 times daily x 3 days, then 1 tablet (5mg ) 2 times daily starting 07/16/14. (Patient taking differently: Take 5 mg by mouth 2 (two) times daily. ) 90 tablet 0  . atorvastatin (LIPITOR) 40 MG tablet Take 40 mg by mouth at bedtime.  2  . bethanechol (URECHOLINE) 25 MG tablet Take 25 mg by mouth 3 (three) times daily.  6  . budesonide-formoterol (SYMBICORT) 160-4.5 MCG/ACT inhaler Inhale 2 puffs into the lungs 2 (two) times daily.    . clopidogrel (PLAVIX) 75 MG tablet Take 75 mg by mouth daily.     Marland Kitchen diltiazem (TIAZAC) 360 MG 24 hr capsule Take 360 mg by mouth daily.    Marland Kitchen donepezil (ARICEPT) 10 MG tablet Take 1  tablet by mouth daily.    Marland Kitchen esomeprazole (NEXIUM) 20 MG capsule Take 20 mg by mouth daily at 12 noon.    . ferrous sulfate 325 (65 FE) MG tablet Take 325 mg by mouth daily with breakfast.    . Fish Oil-Cholecalciferol (FISH OIL + D3) 1000-1000 MG-UNIT CAPS Take 1 capsule by mouth daily.    . furosemide (LASIX) 40 MG tablet Take 40 mg by mouth every morning.     . gabapentin (NEURONTIN) 800 MG tablet Take 800 mg by mouth every 6 (six) hours.     Marland Kitchen glucosamine-chondroitin 500-400 MG tablet Take 1 tablet by mouth daily.     Marland Kitchen guaiFENesin (MUCINEX) 600 MG 12 hr tablet Take 1,200 mg by mouth daily.     . montelukast (SINGULAIR) 10 MG tablet Take 10 mg by mouth at bedtime.    . Multiple Vitamin (MULTIVITAMIN) capsule Take 1 capsule by mouth daily.      . nitroGLYCERIN (NITROSTAT) 0.4 MG SL tablet Place 0.4 mg under the tongue every 5 (five) minutes as needed.      Marland Kitchen omeprazole (PRILOSEC) 20 MG capsule Take 20 mg by mouth daily.    Marland Kitchen oxycodone (ROXICODONE) 30 MG immediate release tablet Take 1 tablet (30 mg total) by mouth every 4 (four) hours as needed for pain. 20 tablet 0  . polyethylene glycol (MIRALAX / GLYCOLAX) packet Take 17 g by  mouth daily.     . promethazine (PHENERGAN) 25 MG tablet Take 25 mg by mouth every 6 (six) hours as needed.     . ranolazine (RANEXA) 500 MG 12 hr tablet Take 1 tablet (500 mg total) by mouth 2 (two) times daily. 180 tablet 3  . terazosin (HYTRIN) 1 MG capsule Take 4 mg by mouth at bedtime.    Marland Kitchen tiZANidine (ZANAFLEX) 2 MG tablet Take 2 mg by mouth every morning.     . topiramate (TOPAMAX) 25 MG tablet Take 25 mg by mouth daily.    . TUDORZA PRESSAIR 400 MCG/ACT AEPB Inhale 1 puff into the lungs 2 (two) times daily. 3 each 1  . Vitamin D, Ergocalciferol, (DRISDOL) 50000 units CAPS capsule Take 50,000 Units by mouth every 7 (seven) days.     No current facility-administered medications for this visit.    On ROS today:  SOB, continued oxygen need   Physical  Examination   Vitals:   09/04/17 1156  Resp: 20  Weight: 235 lb (106.6 kg)  Height: 6\' 2"  (1.88 m)   Body mass index is 30.17 kg/m.  General Alert, O x 3, Cachectic, Ill appearing  Pulmonary Sym exp, Decreased BUL air movt, rales on BLL  Cardiac RRR, Nl S1, S2, no Murmurs, No rubs, No S3,S4  Vascular Vessel Right Left  Radial Palpable Palpable  Brachial Palpable Palpable  Carotid Palpable, No Bruit Palpable, No Bruit  Aorta Not palpable N/A  Femoral Palpable Palpable  Popliteal Not palpable Not palpable  PT Not palpable BKA  DP Not palpable BKA    Gastro- intestinal soft, non-distended, non-tender to palpation, No guarding or rebound, no HSM, no masses, no CVAT B, No palpable prominent aortic pulse,    Musculo- skeletal M/S 5/5 throughout  , Extremities without ischemic changes except L BKA,   Neurologic Pain and light touch intact in extremities , Motor exam as listed above    Radiology     CTA Chest (09/02/17): official read pending  Based on my review of the CTA, there is no change in thoracic aortic PSA dimensions.   Medical Decision Making   Grant Santos is a 72 y.o. (March 06, 1946) male who presents with: s/p L brachial and ulnar TE, known thoracic aortic PSA, decompensated COPD, s/p prior thoracic aortic repair   No change in dimensions of the thoracic aortic PSA, which makes me think this might be residual excluded aortic wall rather than a true thoracic aortic PSA.  Pt prefers to suspend any further surveillance at this time given the lack of changes.  The patient can follow up with [place] as needed.  Thank you for allowing [place] to participate in this patient's care.   Adele Barthel, MD, FACS [facility] [place] Office: [phone] Pager: [phone]

## 2017-09-02 DIAGNOSIS — I712 Thoracic aortic aneurysm, without rupture: Secondary | ICD-10-CM | POA: Diagnosis not present

## 2017-09-02 DIAGNOSIS — Z8679 Personal history of other diseases of the circulatory system: Secondary | ICD-10-CM | POA: Diagnosis not present

## 2017-09-02 DIAGNOSIS — I719 Aortic aneurysm of unspecified site, without rupture: Secondary | ICD-10-CM | POA: Diagnosis not present

## 2017-09-02 DIAGNOSIS — T82390D Other mechanical complication of aortic (bifurcation) graft (replacement), subsequent encounter: Secondary | ICD-10-CM | POA: Diagnosis not present

## 2017-09-03 DIAGNOSIS — G8922 Chronic post-thoracotomy pain: Secondary | ICD-10-CM | POA: Diagnosis not present

## 2017-09-03 DIAGNOSIS — Z79891 Long term (current) use of opiate analgesic: Secondary | ICD-10-CM | POA: Diagnosis not present

## 2017-09-03 DIAGNOSIS — M4722 Other spondylosis with radiculopathy, cervical region: Secondary | ICD-10-CM | POA: Diagnosis not present

## 2017-09-03 DIAGNOSIS — M47816 Spondylosis without myelopathy or radiculopathy, lumbar region: Secondary | ICD-10-CM | POA: Diagnosis not present

## 2017-09-04 ENCOUNTER — Encounter: Payer: Self-pay | Admitting: Vascular Surgery

## 2017-09-04 ENCOUNTER — Ambulatory Visit (INDEPENDENT_AMBULATORY_CARE_PROVIDER_SITE_OTHER): Payer: Non-veteran care | Admitting: Vascular Surgery

## 2017-09-04 ENCOUNTER — Other Ambulatory Visit: Payer: Self-pay

## 2017-09-04 VITALS — BP 107/56 | HR 72 | Temp 98.9°F | Resp 20 | Ht 74.0 in | Wt 235.0 lb

## 2017-09-04 DIAGNOSIS — T82390D Other mechanical complication of aortic (bifurcation) graft (replacement), subsequent encounter: Secondary | ICD-10-CM

## 2017-09-05 ENCOUNTER — Encounter: Payer: Self-pay | Admitting: Vascular Surgery

## 2017-09-09 DIAGNOSIS — J449 Chronic obstructive pulmonary disease, unspecified: Secondary | ICD-10-CM | POA: Diagnosis not present

## 2017-09-19 ENCOUNTER — Encounter: Payer: Self-pay | Admitting: Vascular Surgery

## 2017-10-07 ENCOUNTER — Encounter: Payer: Self-pay | Admitting: Cardiology

## 2017-10-09 ENCOUNTER — Encounter: Payer: Self-pay | Admitting: Cardiology

## 2017-10-09 DIAGNOSIS — J449 Chronic obstructive pulmonary disease, unspecified: Secondary | ICD-10-CM | POA: Diagnosis not present

## 2017-10-30 ENCOUNTER — Telehealth: Payer: Self-pay | Admitting: Cardiology

## 2017-10-30 ENCOUNTER — Encounter: Payer: PPO | Admitting: Cardiology

## 2017-10-30 NOTE — Telephone Encounter (Signed)
Patient had an appt this am with Camnitz and went to the appt and wa unable to find a park big enough for his wheelchair and was unable to find a park so they just left and called [place] when he got home.  He wants to schedule when he is in [place].Marland Kitchen

## 2017-10-30 NOTE — Telephone Encounter (Signed)
Advised patient Dr Curt Bears is agreeable to moving his follow up out till we start seeing patient's in [place] later this year. Pt aware I will place a recall letter for reminder. Pt given device clinic number to call to discuss sending manual transmission, per his request.

## 2017-10-31 DIAGNOSIS — M47816 Spondylosis without myelopathy or radiculopathy, lumbar region: Secondary | ICD-10-CM | POA: Diagnosis not present

## 2017-10-31 DIAGNOSIS — Z79891 Long term (current) use of opiate analgesic: Secondary | ICD-10-CM | POA: Diagnosis not present

## 2017-10-31 DIAGNOSIS — G8922 Chronic post-thoracotomy pain: Secondary | ICD-10-CM | POA: Diagnosis not present

## 2017-10-31 DIAGNOSIS — M4722 Other spondylosis with radiculopathy, cervical region: Secondary | ICD-10-CM | POA: Diagnosis not present

## 2017-11-09 DIAGNOSIS — J449 Chronic obstructive pulmonary disease, unspecified: Secondary | ICD-10-CM | POA: Diagnosis not present

## 2017-11-21 ENCOUNTER — Encounter: Payer: Self-pay | Admitting: Cardiology

## 2017-11-21 ENCOUNTER — Ambulatory Visit (INDEPENDENT_AMBULATORY_CARE_PROVIDER_SITE_OTHER): Payer: Non-veteran care | Admitting: *Deleted

## 2017-11-21 DIAGNOSIS — I495 Sick sinus syndrome: Secondary | ICD-10-CM

## 2017-11-21 NOTE — Progress Notes (Signed)
Remote pacemaker transmission.   

## 2017-12-09 DIAGNOSIS — J449 Chronic obstructive pulmonary disease, unspecified: Secondary | ICD-10-CM | POA: Diagnosis not present

## 2017-12-10 LAB — CUP PACEART REMOTE DEVICE CHECK
Brady Statistic AP VP Percent: 5 %
Brady Statistic RA Percent Paced: 67 %
Brady Statistic RV Percent Paced: 5 %
Date Time Interrogation Session: 20190702065143
Implantable Lead Location: 753859
Implantable Lead Model: 350
Implantable Lead Model: 350
Implantable Lead Serial Number: 29758228
Implantable Pulse Generator Implant Date: 20160412
Lead Channel Impedance Value: 505 Ohm
Lead Channel Setting Pacing Amplitude: 1.8 V
Lead Channel Setting Pacing Pulse Width: 0.4 ms
MDC IDC LEAD IMPLANT DT: 20160412
MDC IDC LEAD IMPLANT DT: 20160412
MDC IDC LEAD LOCATION: 753860
MDC IDC LEAD SERIAL: 29619713
MDC IDC MSMT BATTERY REMAINING PERCENTAGE: 75 %
MDC IDC MSMT LEADCHNL RV IMPEDANCE VALUE: 463 Ohm
MDC IDC MSMT LEADCHNL RV PACING THRESHOLD AMPLITUDE: 1.4 V
MDC IDC MSMT LEADCHNL RV PACING THRESHOLD PULSEWIDTH: 0.4 ms
MDC IDC PG SERIAL: 68477892
MDC IDC SET LEADCHNL RA PACING AMPLITUDE: 2.4 V
MDC IDC STAT BRADY AP VS PERCENT: 62 %
MDC IDC STAT BRADY AS VP PERCENT: 0 %
MDC IDC STAT BRADY AS VS PERCENT: 33 %
Pulse Gen Model: 394929

## 2017-12-26 DIAGNOSIS — M47816 Spondylosis without myelopathy or radiculopathy, lumbar region: Secondary | ICD-10-CM | POA: Diagnosis not present

## 2017-12-26 DIAGNOSIS — Z79891 Long term (current) use of opiate analgesic: Secondary | ICD-10-CM | POA: Diagnosis not present

## 2017-12-26 DIAGNOSIS — M4722 Other spondylosis with radiculopathy, cervical region: Secondary | ICD-10-CM | POA: Diagnosis not present

## 2017-12-26 DIAGNOSIS — G8922 Chronic post-thoracotomy pain: Secondary | ICD-10-CM | POA: Diagnosis not present

## 2017-12-26 DIAGNOSIS — G894 Chronic pain syndrome: Secondary | ICD-10-CM | POA: Diagnosis not present

## 2018-01-09 DIAGNOSIS — J449 Chronic obstructive pulmonary disease, unspecified: Secondary | ICD-10-CM | POA: Diagnosis not present

## 2018-01-28 DIAGNOSIS — I712 Thoracic aortic aneurysm, without rupture: Secondary | ICD-10-CM | POA: Diagnosis not present

## 2018-01-28 DIAGNOSIS — Z1339 Encounter for screening examination for other mental health and behavioral disorders: Secondary | ICD-10-CM | POA: Diagnosis not present

## 2018-01-28 DIAGNOSIS — R600 Localized edema: Secondary | ICD-10-CM | POA: Diagnosis not present

## 2018-01-28 DIAGNOSIS — Z6832 Body mass index (BMI) 32.0-32.9, adult: Secondary | ICD-10-CM | POA: Diagnosis not present

## 2018-01-28 DIAGNOSIS — I10 Essential (primary) hypertension: Secondary | ICD-10-CM | POA: Diagnosis not present

## 2018-01-28 DIAGNOSIS — E559 Vitamin D deficiency, unspecified: Secondary | ICD-10-CM | POA: Diagnosis not present

## 2018-01-28 DIAGNOSIS — E782 Mixed hyperlipidemia: Secondary | ICD-10-CM | POA: Diagnosis not present

## 2018-01-28 DIAGNOSIS — E669 Obesity, unspecified: Secondary | ICD-10-CM | POA: Diagnosis not present

## 2018-01-28 DIAGNOSIS — Z1331 Encounter for screening for depression: Secondary | ICD-10-CM | POA: Diagnosis not present

## 2018-01-28 DIAGNOSIS — R258 Other abnormal involuntary movements: Secondary | ICD-10-CM | POA: Diagnosis not present

## 2018-01-28 DIAGNOSIS — I4891 Unspecified atrial fibrillation: Secondary | ICD-10-CM | POA: Diagnosis not present

## 2018-01-28 DIAGNOSIS — Z79899 Other long term (current) drug therapy: Secondary | ICD-10-CM | POA: Diagnosis not present

## 2018-01-28 DIAGNOSIS — N400 Enlarged prostate without lower urinary tract symptoms: Secondary | ICD-10-CM | POA: Diagnosis not present

## 2018-02-01 DIAGNOSIS — E785 Hyperlipidemia, unspecified: Secondary | ICD-10-CM | POA: Insufficient documentation
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
## 2018-02-03 ENCOUNTER — Encounter: Payer: Self-pay | Admitting: Emergency Medicine

## 2018-02-03 ENCOUNTER — Encounter: Payer: Self-pay | Admitting: Cardiology

## 2018-02-03 ENCOUNTER — Ambulatory Visit (INDEPENDENT_AMBULATORY_CARE_PROVIDER_SITE_OTHER): Payer: PPO | Admitting: Cardiology

## 2018-02-03 VITALS — BP 112/54 | HR 64 | Ht 74.0 in | Wt 235.0 lb

## 2018-02-03 DIAGNOSIS — I110 Hypertensive heart disease with heart failure: Secondary | ICD-10-CM | POA: Diagnosis not present

## 2018-02-03 DIAGNOSIS — Z9981 Dependence on supplemental oxygen: Secondary | ICD-10-CM

## 2018-02-03 DIAGNOSIS — I480 Paroxysmal atrial fibrillation: Secondary | ICD-10-CM

## 2018-02-03 DIAGNOSIS — I5032 Chronic diastolic (congestive) heart failure: Secondary | ICD-10-CM | POA: Diagnosis not present

## 2018-02-03 DIAGNOSIS — E7800 Pure hypercholesterolemia, unspecified: Secondary | ICD-10-CM

## 2018-02-03 DIAGNOSIS — Z7901 Long term (current) use of anticoagulants: Secondary | ICD-10-CM

## 2018-02-03 DIAGNOSIS — J449 Chronic obstructive pulmonary disease, unspecified: Secondary | ICD-10-CM

## 2018-02-03 DIAGNOSIS — Z950 Presence of cardiac pacemaker: Secondary | ICD-10-CM

## 2018-02-03 DIAGNOSIS — I25709 Atherosclerosis of coronary artery bypass graft(s), unspecified, with unspecified angina pectoris: Secondary | ICD-10-CM | POA: Diagnosis not present

## 2018-02-03 MED ORDER — FUROSEMIDE 40 MG PO TABS: 40.0000 mg | ORAL_TABLET | Freq: Two times a day (BID) | ORAL | 3 refills | Status: AC

## 2018-02-03 NOTE — Patient Instructions (Addendum)
Medication Instructions:  Your physician has recommended you make the following change in your medication:  Change: Furosemide 40 mg twice daily to three times a day on Monday's, Wednesdays, and Fridays.   Labwork: None.  Testing/Procedures: None.  Follow-Up: Your physician wants you to follow-up in: 6 months. You will receive a reminder letter in the mail two months in advance. If you don't receive a letter, please call our office to schedule the follow-up appointment.   Any Other Special Instructions Will Be Listed Below (If Applicable).     If you need a refill on your cardiac medications before your next appointment, please call your pharmacy.     Do not add salt to your diet

## 2018-02-09 DIAGNOSIS — J449 Chronic obstructive pulmonary disease, unspecified: Secondary | ICD-10-CM | POA: Diagnosis not present

## 2018-02-13 DIAGNOSIS — N400 Enlarged prostate without lower urinary tract symptoms: Secondary | ICD-10-CM | POA: Diagnosis not present

## 2018-02-13 DIAGNOSIS — N3020 Other chronic cystitis without hematuria: Secondary | ICD-10-CM | POA: Diagnosis not present

## 2018-02-13 DIAGNOSIS — N35811 Other urethral stricture, male, meatal: Secondary | ICD-10-CM | POA: Diagnosis not present

## 2018-02-13 DIAGNOSIS — N320 Bladder-neck obstruction: Secondary | ICD-10-CM | POA: Diagnosis not present

## 2018-02-20 ENCOUNTER — Ambulatory Visit (INDEPENDENT_AMBULATORY_CARE_PROVIDER_SITE_OTHER): Payer: PPO | Admitting: *Deleted

## 2018-02-20 DIAGNOSIS — I495 Sick sinus syndrome: Secondary | ICD-10-CM | POA: Diagnosis not present

## 2018-02-20 NOTE — Progress Notes (Signed)
Remote pacemaker transmission.   

## 2018-02-26 DIAGNOSIS — M4722 Other spondylosis with radiculopathy, cervical region: Secondary | ICD-10-CM | POA: Diagnosis not present

## 2018-02-26 DIAGNOSIS — Z79891 Long term (current) use of opiate analgesic: Secondary | ICD-10-CM | POA: Diagnosis not present

## 2018-02-26 DIAGNOSIS — G8922 Chronic post-thoracotomy pain: Secondary | ICD-10-CM | POA: Diagnosis not present

## 2018-02-26 DIAGNOSIS — M47816 Spondylosis without myelopathy or radiculopathy, lumbar region: Secondary | ICD-10-CM | POA: Diagnosis not present

## 2018-03-07 DIAGNOSIS — N3020 Other chronic cystitis without hematuria: Secondary | ICD-10-CM | POA: Diagnosis not present

## 2018-03-07 DIAGNOSIS — N312 Flaccid neuropathic bladder, not elsewhere classified: Secondary | ICD-10-CM | POA: Diagnosis not present

## 2018-03-11 DIAGNOSIS — J449 Chronic obstructive pulmonary disease, unspecified: Secondary | ICD-10-CM | POA: Diagnosis not present

## 2018-03-12 LAB — CUP PACEART REMOTE DEVICE CHECK
Brady Statistic AP VS Percent: 66 %
Brady Statistic AS VS Percent: 29 %
Brady Statistic RA Percent Paced: 70 %
Brady Statistic RV Percent Paced: 4 %
Date Time Interrogation Session: 20191002053623
Implantable Lead Implant Date: 20160412
Implantable Lead Location: 753859
Implantable Lead Location: 753860
Implantable Lead Serial Number: 29619713
Implantable Lead Serial Number: 29758228
Implantable Pulse Generator Implant Date: 20160412
Lead Channel Pacing Threshold Pulse Width: 0.4 ms
Lead Channel Setting Pacing Amplitude: 1.8 V
Lead Channel Setting Pacing Pulse Width: 0.4 ms
MDC IDC LEAD IMPLANT DT: 20160412
MDC IDC MSMT BATTERY REMAINING PERCENTAGE: 70 %
MDC IDC MSMT LEADCHNL RA IMPEDANCE VALUE: 494 Ohm
MDC IDC MSMT LEADCHNL RV IMPEDANCE VALUE: 465 Ohm
MDC IDC MSMT LEADCHNL RV PACING THRESHOLD AMPLITUDE: 1.4 V
MDC IDC SET LEADCHNL RA PACING AMPLITUDE: 2.4 V
MDC IDC STAT BRADY AP VP PERCENT: 4 %
MDC IDC STAT BRADY AS VP PERCENT: 0 %
Pulse Gen Model: 394929
Pulse Gen Serial Number: 68477892

## 2018-04-02 DIAGNOSIS — R2232 Localized swelling, mass and lump, left upper limb: Secondary | ICD-10-CM | POA: Diagnosis not present

## 2018-04-02 DIAGNOSIS — Z7901 Long term (current) use of anticoagulants: Secondary | ICD-10-CM | POA: Diagnosis not present

## 2018-04-02 DIAGNOSIS — M7989 Other specified soft tissue disorders: Secondary | ICD-10-CM | POA: Diagnosis not present

## 2018-04-02 DIAGNOSIS — S40022A Contusion of left upper arm, initial encounter: Secondary | ICD-10-CM | POA: Diagnosis not present

## 2018-04-02 DIAGNOSIS — Z6831 Body mass index (BMI) 31.0-31.9, adult: Secondary | ICD-10-CM | POA: Diagnosis not present

## 2018-04-02 DIAGNOSIS — M799 Soft tissue disorder, unspecified: Secondary | ICD-10-CM | POA: Diagnosis not present

## 2018-04-05 ENCOUNTER — Other Ambulatory Visit: Payer: Self-pay

## 2018-04-05 ENCOUNTER — Emergency Department (HOSPITAL_COMMUNITY)
Admission: EM | Admit: 2018-04-05 | Discharge: 2018-04-05 | Disposition: A | Discharge status: Home / Self Care | Payer: Non-veteran care | Attending: Emergency Medicine | Admitting: Emergency Medicine

## 2018-04-05 ENCOUNTER — Encounter (HOSPITAL_COMMUNITY): Payer: Self-pay

## 2018-04-05 DIAGNOSIS — M79602 Pain in left arm: Secondary | ICD-10-CM | POA: Diagnosis not present

## 2018-04-05 DIAGNOSIS — Y929 Unspecified place or not applicable: Secondary | ICD-10-CM | POA: Insufficient documentation

## 2018-04-05 DIAGNOSIS — R52 Pain, unspecified: Secondary | ICD-10-CM | POA: Diagnosis not present

## 2018-04-05 DIAGNOSIS — I110 Hypertensive heart disease with heart failure: Secondary | ICD-10-CM | POA: Insufficient documentation

## 2018-04-05 DIAGNOSIS — Z79899 Other long term (current) drug therapy: Secondary | ICD-10-CM | POA: Diagnosis not present

## 2018-04-05 DIAGNOSIS — Z7901 Long term (current) use of anticoagulants: Secondary | ICD-10-CM | POA: Diagnosis not present

## 2018-04-05 DIAGNOSIS — I5032 Chronic diastolic (congestive) heart failure: Secondary | ICD-10-CM | POA: Diagnosis not present

## 2018-04-05 DIAGNOSIS — S5012XA Contusion of left forearm, initial encounter: Secondary | ICD-10-CM | POA: Insufficient documentation

## 2018-04-05 DIAGNOSIS — Y998 Other external cause status: Secondary | ICD-10-CM | POA: Insufficient documentation

## 2018-04-05 DIAGNOSIS — Z951 Presence of aortocoronary bypass graft: Secondary | ICD-10-CM | POA: Diagnosis not present

## 2018-04-05 DIAGNOSIS — Z950 Presence of cardiac pacemaker: Secondary | ICD-10-CM | POA: Insufficient documentation

## 2018-04-05 DIAGNOSIS — R0902 Hypoxemia: Secondary | ICD-10-CM | POA: Diagnosis not present

## 2018-04-05 DIAGNOSIS — R609 Edema, unspecified: Secondary | ICD-10-CM | POA: Diagnosis not present

## 2018-04-05 DIAGNOSIS — Y33XXXA Other specified events, undetermined intent, initial encounter: Secondary | ICD-10-CM | POA: Insufficient documentation

## 2018-04-05 DIAGNOSIS — Z87891 Personal history of nicotine dependence: Secondary | ICD-10-CM | POA: Diagnosis not present

## 2018-04-05 DIAGNOSIS — I2510 Atherosclerotic heart disease of native coronary artery without angina pectoris: Secondary | ICD-10-CM | POA: Diagnosis not present

## 2018-04-05 DIAGNOSIS — Y939 Activity, unspecified: Secondary | ICD-10-CM | POA: Diagnosis not present

## 2018-04-05 DIAGNOSIS — J449 Chronic obstructive pulmonary disease, unspecified: Secondary | ICD-10-CM | POA: Diagnosis not present

## 2018-04-05 DIAGNOSIS — I4819 Other persistent atrial fibrillation: Secondary | ICD-10-CM | POA: Insufficient documentation

## 2018-04-05 DIAGNOSIS — R509 Fever, unspecified: Secondary | ICD-10-CM | POA: Diagnosis not present

## 2018-04-05 DIAGNOSIS — R58 Hemorrhage, not elsewhere classified: Secondary | ICD-10-CM

## 2018-04-05 LAB — COMPREHENSIVE METABOLIC PANEL
ALBUMIN: 3.2 g/dL — AB (ref 3.5–5.0)
ALT: 13 U/L (ref 0–44)
ANION GAP: 6 (ref 5–15)
AST: 26 U/L (ref 15–41)
Alkaline Phosphatase: 126 U/L (ref 38–126)
BUN: 9 mg/dL (ref 8–23)
CHLORIDE: 101 mmol/L (ref 98–111)
CO2: 32 mmol/L (ref 22–32)
Calcium: 8.6 mg/dL — ABNORMAL LOW (ref 8.9–10.3)
Creatinine, Ser: 0.95 mg/dL (ref 0.61–1.24)
GFR calc Af Amer: >60 mL/min (ref >60)
GFR calc non Af Amer: >60 mL/min (ref >60)
Glucose, Bld: 105 mg/dL — ABNORMAL HIGH (ref 70–99)
POTASSIUM: 3.5 mmol/L (ref 3.5–5.1)
Sodium: 139 mmol/L (ref 135–145)
Total Bilirubin: 1 mg/dL (ref 0.3–1.2)
Total Protein: 6 g/dL — ABNORMAL LOW (ref 6.5–8.1)

## 2018-04-05 LAB — CBC WITH DIFFERENTIAL/PLATELET
Abs Immature Granulocytes: 0.03 10*3/uL (ref 0.00–0.07)
BASOS ABS: 0.1 10*3/uL (ref 0.0–0.1)
Basophils Relative: 1 %
Eosinophils Absolute: 0.3 10*3/uL (ref 0.0–0.5)
Eosinophils Relative: 3 %
HCT: 36.1 % — ABNORMAL LOW (ref 39.0–52.0)
HEMOGLOBIN: 11.5 g/dL — AB (ref 13.0–17.0)
IMMATURE GRANULOCYTES: 0 %
LYMPHS ABS: 1.1 10*3/uL (ref 0.7–4.0)
LYMPHS PCT: 13 %
MCH: 31.1 pg (ref 26.0–34.0)
MCHC: 31.9 g/dL (ref 30.0–36.0)
MCV: 97.6 fL (ref 80.0–100.0)
MONOS PCT: 8 %
Monocytes Absolute: 0.7 10*3/uL (ref 0.1–1.0)
NRBC: 0 % (ref 0.0–0.2)
Neutro Abs: 6.6 10*3/uL (ref 1.7–7.7)
Neutrophils Relative %: 75 %
Platelets: 223 10*3/uL (ref 150–400)
RBC: 3.7 MIL/uL — ABNORMAL LOW (ref 4.22–5.81)
RDW: 12.7 % (ref 11.5–15.5)
WBC: 8.7 10*3/uL (ref 4.0–10.5)

## 2018-04-05 MED ORDER — DICLOFENAC SODIUM 1 % TD GEL: 2.0000 g | Freq: Four times a day (QID) | TRANSDERMAL | 0 refills | Status: AC

## 2018-04-05 NOTE — Discharge Instructions (Addendum)
You were seen today for L arm swelling and bruising. Your exam today was reassuring in that this does not appear to be a vascular problem. This may be due to a musculoskeletal problem.  Continue taking home medication as prescribed.  Use the voltaren gel as needed for pain.  Use the arm sling to decrease movement and help with pain control.  Apply ice to your elbow, 20 minutes at a time.  Follow up with your primary care doctor for further evaluation.  Return to the ER if you develop inability to move your hand, numbness of your hand, your hand turns cool/cold, or with any new or concerning symptoms.

## 2018-04-05 NOTE — ED Triage Notes (Signed)
Pt from home with ems for left arm swelling x1 wk. Was seen at [facility] this week for same, negaive [place] for blood clot. Pt has hx of blood clots, on Eliquis. Ems reports discoloration to arm down to wrist and no radial pulse present. Hand warm to touch, good cap refill and equal sensation in arms. Pt denies injury.

## 2018-04-05 NOTE — ED Notes (Signed)
Pt discharged, waiting on son to bring his portable O2 machine. Pt on 3L chronically.

## 2018-04-05 NOTE — ED Notes (Signed)
Patient verbalizes understanding of discharge instructions. Opportunity for questioning and answers were provided. Armband removed by staff, pt discharged from ED.  
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
## 2018-04-05 NOTE — ED Notes (Signed)
Pt seen leaving ED without receiving discharge vitals.

## 2018-04-05 NOTE — ED Notes (Signed)
EDP at bedside  

## 2018-04-07 DIAGNOSIS — J439 Emphysema, unspecified: Secondary | ICD-10-CM | POA: Diagnosis not present

## 2018-04-07 DIAGNOSIS — R2232 Localized swelling, mass and lump, left upper limb: Secondary | ICD-10-CM | POA: Diagnosis not present

## 2018-04-08 DIAGNOSIS — M25522 Pain in left elbow: Secondary | ICD-10-CM | POA: Diagnosis not present

## 2018-04-11 DIAGNOSIS — J449 Chronic obstructive pulmonary disease, unspecified: Secondary | ICD-10-CM | POA: Diagnosis not present

## 2018-04-11 DIAGNOSIS — M25522 Pain in left elbow: Secondary | ICD-10-CM | POA: Diagnosis not present

## 2018-04-16 DIAGNOSIS — M25522 Pain in left elbow: Secondary | ICD-10-CM | POA: Diagnosis not present

## 2018-04-16 DIAGNOSIS — S40022A Contusion of left upper arm, initial encounter: Secondary | ICD-10-CM | POA: Diagnosis not present

## 2018-04-21 DIAGNOSIS — I517 Cardiomegaly: Secondary | ICD-10-CM | POA: Diagnosis not present

## 2018-04-21 DIAGNOSIS — M25522 Pain in left elbow: Secondary | ICD-10-CM | POA: Diagnosis not present

## 2018-04-21 DIAGNOSIS — R0602 Shortness of breath: Secondary | ICD-10-CM | POA: Diagnosis not present

## 2018-04-21 DIAGNOSIS — J440 Chronic obstructive pulmonary disease with acute lower respiratory infection: Secondary | ICD-10-CM | POA: Diagnosis not present

## 2018-04-21 DIAGNOSIS — Z6832 Body mass index (BMI) 32.0-32.9, adult: Secondary | ICD-10-CM | POA: Diagnosis not present

## 2018-04-21 DIAGNOSIS — E669 Obesity, unspecified: Secondary | ICD-10-CM | POA: Diagnosis not present

## 2018-04-25 DIAGNOSIS — R52 Pain, unspecified: Secondary | ICD-10-CM | POA: Diagnosis not present

## 2018-04-25 DIAGNOSIS — J8410 Pulmonary fibrosis, unspecified: Secondary | ICD-10-CM | POA: Diagnosis not present

## 2018-04-25 DIAGNOSIS — G894 Chronic pain syndrome: Secondary | ICD-10-CM | POA: Diagnosis not present

## 2018-04-25 DIAGNOSIS — E873 Alkalosis: Secondary | ICD-10-CM | POA: Diagnosis not present

## 2018-04-25 DIAGNOSIS — M1990 Unspecified osteoarthritis, unspecified site: Secondary | ICD-10-CM | POA: Diagnosis not present

## 2018-04-25 DIAGNOSIS — R0902 Hypoxemia: Secondary | ICD-10-CM | POA: Diagnosis not present

## 2018-04-25 DIAGNOSIS — E7800 Pure hypercholesterolemia, unspecified: Secondary | ICD-10-CM | POA: Diagnosis not present

## 2018-04-25 DIAGNOSIS — I119 Hypertensive heart disease without heart failure: Secondary | ICD-10-CM | POA: Diagnosis not present

## 2018-04-25 DIAGNOSIS — Z951 Presence of aortocoronary bypass graft: Secondary | ICD-10-CM | POA: Diagnosis not present

## 2018-04-25 DIAGNOSIS — J9622 Acute and chronic respiratory failure with hypercapnia: Secondary | ICD-10-CM | POA: Diagnosis not present

## 2018-04-25 DIAGNOSIS — G4730 Sleep apnea, unspecified: Secondary | ICD-10-CM | POA: Diagnosis not present

## 2018-04-25 DIAGNOSIS — J84111 Idiopathic interstitial pneumonia, not otherwise specified: Secondary | ICD-10-CM | POA: Diagnosis not present

## 2018-04-25 DIAGNOSIS — J811 Chronic pulmonary edema: Secondary | ICD-10-CM | POA: Diagnosis not present

## 2018-04-25 DIAGNOSIS — I340 Nonrheumatic mitral (valve) insufficiency: Secondary | ICD-10-CM | POA: Diagnosis not present

## 2018-04-25 DIAGNOSIS — J440 Chronic obstructive pulmonary disease with acute lower respiratory infection: Secondary | ICD-10-CM | POA: Diagnosis not present

## 2018-04-25 DIAGNOSIS — I714 Abdominal aortic aneurysm, without rupture: Secondary | ICD-10-CM | POA: Diagnosis not present

## 2018-04-25 DIAGNOSIS — Z7901 Long term (current) use of anticoagulants: Secondary | ICD-10-CM | POA: Diagnosis not present

## 2018-04-25 DIAGNOSIS — F0390 Unspecified dementia without behavioral disturbance: Secondary | ICD-10-CM | POA: Diagnosis not present

## 2018-04-25 DIAGNOSIS — R197 Diarrhea, unspecified: Secondary | ICD-10-CM | POA: Diagnosis not present

## 2018-04-25 DIAGNOSIS — J441 Chronic obstructive pulmonary disease with (acute) exacerbation: Secondary | ICD-10-CM | POA: Diagnosis not present

## 2018-04-25 DIAGNOSIS — I2510 Atherosclerotic heart disease of native coronary artery without angina pectoris: Secondary | ICD-10-CM | POA: Diagnosis not present

## 2018-04-25 DIAGNOSIS — F418 Other specified anxiety disorders: Secondary | ICD-10-CM | POA: Diagnosis not present

## 2018-04-25 DIAGNOSIS — G4733 Obstructive sleep apnea (adult) (pediatric): Secondary | ICD-10-CM | POA: Diagnosis not present

## 2018-04-25 DIAGNOSIS — J189 Pneumonia, unspecified organism: Secondary | ICD-10-CM | POA: Diagnosis not present

## 2018-04-25 DIAGNOSIS — I482 Chronic atrial fibrillation, unspecified: Secondary | ICD-10-CM | POA: Diagnosis not present

## 2018-04-25 DIAGNOSIS — R0602 Shortness of breath: Secondary | ICD-10-CM | POA: Diagnosis not present

## 2018-04-25 DIAGNOSIS — J9690 Respiratory failure, unspecified, unspecified whether with hypoxia or hypercapnia: Secondary | ICD-10-CM | POA: Diagnosis not present

## 2018-04-25 DIAGNOSIS — Z9989 Dependence on other enabling machines and devices: Secondary | ICD-10-CM | POA: Diagnosis not present

## 2018-04-25 DIAGNOSIS — I110 Hypertensive heart disease with heart failure: Secondary | ICD-10-CM | POA: Diagnosis not present

## 2018-04-25 DIAGNOSIS — I252 Old myocardial infarction: Secondary | ICD-10-CM | POA: Diagnosis not present

## 2018-04-25 DIAGNOSIS — J84112 Idiopathic pulmonary fibrosis: Secondary | ICD-10-CM | POA: Diagnosis not present

## 2018-04-25 DIAGNOSIS — I4891 Unspecified atrial fibrillation: Secondary | ICD-10-CM | POA: Diagnosis not present

## 2018-04-25 DIAGNOSIS — M545 Low back pain: Secondary | ICD-10-CM | POA: Diagnosis not present

## 2018-04-25 DIAGNOSIS — Z8701 Personal history of pneumonia (recurrent): Secondary | ICD-10-CM | POA: Diagnosis not present

## 2018-04-25 DIAGNOSIS — Z885 Allergy status to narcotic agent status: Secondary | ICD-10-CM | POA: Diagnosis not present

## 2018-04-25 DIAGNOSIS — I5032 Chronic diastolic (congestive) heart failure: Secondary | ICD-10-CM | POA: Diagnosis not present

## 2018-04-25 DIAGNOSIS — Z888 Allergy status to other drugs, medicaments and biological substances status: Secondary | ICD-10-CM | POA: Diagnosis not present

## 2018-04-25 DIAGNOSIS — S40022A Contusion of left upper arm, initial encounter: Secondary | ICD-10-CM | POA: Diagnosis not present

## 2018-04-25 DIAGNOSIS — T462X2A Poisoning by other antidysrhythmic drugs, intentional self-harm, initial encounter: Secondary | ICD-10-CM | POA: Diagnosis not present

## 2018-04-25 DIAGNOSIS — I491 Atrial premature depolarization: Secondary | ICD-10-CM | POA: Diagnosis not present

## 2018-04-25 DIAGNOSIS — J9621 Acute and chronic respiratory failure with hypoxia: Secondary | ICD-10-CM | POA: Diagnosis not present

## 2018-04-25 DIAGNOSIS — I509 Heart failure, unspecified: Secondary | ICD-10-CM | POA: Diagnosis not present

## 2018-04-26 DIAGNOSIS — I340 Nonrheumatic mitral (valve) insufficiency: Secondary | ICD-10-CM

## 2018-04-26 DIAGNOSIS — I509 Heart failure, unspecified: Secondary | ICD-10-CM

## 2018-04-27 DIAGNOSIS — Z7901 Long term (current) use of anticoagulants: Secondary | ICD-10-CM

## 2018-04-27 DIAGNOSIS — I119 Hypertensive heart disease without heart failure: Secondary | ICD-10-CM

## 2018-04-27 DIAGNOSIS — I4891 Unspecified atrial fibrillation: Secondary | ICD-10-CM

## 2018-05-05 ENCOUNTER — Other Ambulatory Visit (HOSPITAL_COMMUNITY): Payer: Self-pay

## 2018-05-05 ENCOUNTER — Inpatient Hospital Stay
Admission: AD | Admit: 2018-05-05 | Discharge: 2018-06-11 | Disposition: E | Payer: PPO | Source: Ambulatory Visit | Attending: Internal Medicine | Admitting: Internal Medicine

## 2018-05-05 DIAGNOSIS — G4730 Sleep apnea, unspecified: Secondary | ICD-10-CM | POA: Diagnosis not present

## 2018-05-05 DIAGNOSIS — I5032 Chronic diastolic (congestive) heart failure: Secondary | ICD-10-CM | POA: Diagnosis present

## 2018-05-05 DIAGNOSIS — Z87891 Personal history of nicotine dependence: Secondary | ICD-10-CM | POA: Diagnosis not present

## 2018-05-05 DIAGNOSIS — G894 Chronic pain syndrome: Secondary | ICD-10-CM | POA: Diagnosis not present

## 2018-05-05 DIAGNOSIS — I110 Hypertensive heart disease with heart failure: Secondary | ICD-10-CM | POA: Diagnosis not present

## 2018-05-05 DIAGNOSIS — I714 Abdominal aortic aneurysm, without rupture: Secondary | ICD-10-CM | POA: Diagnosis not present

## 2018-05-05 DIAGNOSIS — J189 Pneumonia, unspecified organism: Secondary | ICD-10-CM

## 2018-05-05 DIAGNOSIS — M1990 Unspecified osteoarthritis, unspecified site: Secondary | ICD-10-CM | POA: Diagnosis not present

## 2018-05-05 DIAGNOSIS — J441 Chronic obstructive pulmonary disease with (acute) exacerbation: Secondary | ICD-10-CM | POA: Diagnosis not present

## 2018-05-05 DIAGNOSIS — J9621 Acute and chronic respiratory failure with hypoxia: Secondary | ICD-10-CM | POA: Diagnosis present

## 2018-05-05 DIAGNOSIS — E1151 Type 2 diabetes mellitus with diabetic peripheral angiopathy without gangrene: Secondary | ICD-10-CM | POA: Diagnosis not present

## 2018-05-05 DIAGNOSIS — J84112 Idiopathic pulmonary fibrosis: Secondary | ICD-10-CM | POA: Diagnosis present

## 2018-05-05 DIAGNOSIS — R05 Cough: Secondary | ICD-10-CM | POA: Diagnosis not present

## 2018-05-05 DIAGNOSIS — I482 Chronic atrial fibrillation, unspecified: Secondary | ICD-10-CM | POA: Diagnosis present

## 2018-05-05 DIAGNOSIS — G4733 Obstructive sleep apnea (adult) (pediatric): Secondary | ICD-10-CM | POA: Diagnosis present

## 2018-05-05 DIAGNOSIS — Z888 Allergy status to other drugs, medicaments and biological substances status: Secondary | ICD-10-CM | POA: Diagnosis not present

## 2018-05-05 DIAGNOSIS — R531 Weakness: Secondary | ICD-10-CM | POA: Diagnosis not present

## 2018-05-05 DIAGNOSIS — T462X2A Poisoning by other antidysrhythmic drugs, intentional self-harm, initial encounter: Secondary | ICD-10-CM | POA: Diagnosis not present

## 2018-05-05 DIAGNOSIS — I2510 Atherosclerotic heart disease of native coronary artery without angina pectoris: Secondary | ICD-10-CM | POA: Diagnosis not present

## 2018-05-05 DIAGNOSIS — F0390 Unspecified dementia without behavioral disturbance: Secondary | ICD-10-CM | POA: Diagnosis not present

## 2018-05-05 DIAGNOSIS — R0602 Shortness of breath: Secondary | ICD-10-CM | POA: Diagnosis not present

## 2018-05-05 DIAGNOSIS — Z9981 Dependence on supplemental oxygen: Secondary | ICD-10-CM | POA: Diagnosis not present

## 2018-05-05 DIAGNOSIS — J9620 Acute and chronic respiratory failure, unspecified whether with hypoxia or hypercapnia: Secondary | ICD-10-CM | POA: Diagnosis not present

## 2018-05-05 DIAGNOSIS — E873 Alkalosis: Secondary | ICD-10-CM | POA: Diagnosis not present

## 2018-05-05 DIAGNOSIS — J84111 Idiopathic interstitial pneumonia, not otherwise specified: Secondary | ICD-10-CM | POA: Diagnosis not present

## 2018-05-05 DIAGNOSIS — J8410 Pulmonary fibrosis, unspecified: Secondary | ICD-10-CM | POA: Diagnosis not present

## 2018-05-05 DIAGNOSIS — Z6828 Body mass index (BMI) 28.0-28.9, adult: Secondary | ICD-10-CM | POA: Diagnosis not present

## 2018-05-05 DIAGNOSIS — F419 Anxiety disorder, unspecified: Secondary | ICD-10-CM | POA: Diagnosis not present

## 2018-05-05 DIAGNOSIS — Z885 Allergy status to narcotic agent status: Secondary | ICD-10-CM | POA: Diagnosis not present

## 2018-05-05 DIAGNOSIS — J440 Chronic obstructive pulmonary disease with acute lower respiratory infection: Secondary | ICD-10-CM | POA: Diagnosis not present

## 2018-05-05 DIAGNOSIS — Z8701 Personal history of pneumonia (recurrent): Secondary | ICD-10-CM | POA: Diagnosis not present

## 2018-05-05 DIAGNOSIS — J849 Interstitial pulmonary disease, unspecified: Secondary | ICD-10-CM | POA: Diagnosis not present

## 2018-05-05 DIAGNOSIS — J9622 Acute and chronic respiratory failure with hypercapnia: Secondary | ICD-10-CM | POA: Diagnosis not present

## 2018-05-05 DIAGNOSIS — E7800 Pure hypercholesterolemia, unspecified: Secondary | ICD-10-CM | POA: Diagnosis not present

## 2018-05-05 DIAGNOSIS — M7981 Nontraumatic hematoma of soft tissue: Secondary | ICD-10-CM | POA: Diagnosis not present

## 2018-05-05 DIAGNOSIS — M545 Low back pain: Secondary | ICD-10-CM | POA: Diagnosis not present

## 2018-05-05 DIAGNOSIS — J449 Chronic obstructive pulmonary disease, unspecified: Secondary | ICD-10-CM | POA: Diagnosis not present

## 2018-05-05 DIAGNOSIS — Z66 Do not resuscitate: Secondary | ICD-10-CM | POA: Diagnosis not present

## 2018-05-05 DIAGNOSIS — R197 Diarrhea, unspecified: Secondary | ICD-10-CM | POA: Diagnosis not present

## 2018-05-05 DIAGNOSIS — Z89512 Acquired absence of left leg below knee: Secondary | ICD-10-CM | POA: Diagnosis not present

## 2018-05-05 DIAGNOSIS — F418 Other specified anxiety disorders: Secondary | ICD-10-CM | POA: Diagnosis not present

## 2018-05-05 DIAGNOSIS — E46 Unspecified protein-calorie malnutrition: Secondary | ICD-10-CM | POA: Diagnosis not present

## 2018-05-05 DIAGNOSIS — Z951 Presence of aortocoronary bypass graft: Secondary | ICD-10-CM | POA: Diagnosis not present

## 2018-05-05 DIAGNOSIS — I252 Old myocardial infarction: Secondary | ICD-10-CM | POA: Diagnosis not present

## 2018-05-05 HISTORY — DX: Obstructive sleep apnea (adult) (pediatric): G47.33

## 2018-05-05 HISTORY — DX: Idiopathic interstitial pneumonia, not otherwise specified: J84.111

## 2018-05-05 HISTORY — DX: Idiopathic pulmonary fibrosis: J84.112

## 2018-05-05 HISTORY — DX: Acute and chronic respiratory failure with hypoxia: J96.21

## 2018-05-06 DIAGNOSIS — I5032 Chronic diastolic (congestive) heart failure: Secondary | ICD-10-CM | POA: Diagnosis not present

## 2018-05-06 DIAGNOSIS — J84111 Idiopathic interstitial pneumonia, not otherwise specified: Secondary | ICD-10-CM | POA: Diagnosis not present

## 2018-05-06 DIAGNOSIS — I482 Chronic atrial fibrillation, unspecified: Secondary | ICD-10-CM | POA: Diagnosis not present

## 2018-05-06 DIAGNOSIS — J84112 Idiopathic pulmonary fibrosis: Secondary | ICD-10-CM | POA: Diagnosis not present

## 2018-05-06 DIAGNOSIS — J9621 Acute and chronic respiratory failure with hypoxia: Secondary | ICD-10-CM | POA: Diagnosis not present

## 2018-05-06 DIAGNOSIS — G4733 Obstructive sleep apnea (adult) (pediatric): Secondary | ICD-10-CM | POA: Diagnosis not present

## 2018-05-06 LAB — CBC WITH DIFFERENTIAL/PLATELET
ABS IMMATURE GRANULOCYTES: 0.9 10*3/uL — AB (ref 0.00–0.07)
BASOS PCT: 0 %
Basophils Absolute: 0 10*3/uL (ref 0.0–0.1)
EOS ABS: 0.5 10*3/uL (ref 0.0–0.5)
EOS PCT: 2 %
HEMATOCRIT: 34.9 % — AB (ref 39.0–52.0)
HEMOGLOBIN: 10.9 g/dL — AB (ref 13.0–17.0)
Lymphocytes Relative: 4 %
Lymphs Abs: 0.9 10*3/uL (ref 0.7–4.0)
MCH: 30.2 pg (ref 26.0–34.0)
MCHC: 31.2 g/dL (ref 30.0–36.0)
MCV: 96.7 fL (ref 80.0–100.0)
METAMYELOCYTES PCT: 1 %
MONO ABS: 0.9 10*3/uL (ref 0.1–1.0)
MONOS PCT: 4 %
Myelocytes: 2 %
NEUTROS ABS: 20.3 10*3/uL — AB (ref 1.7–7.7)
Neutrophils Relative %: 86 %
Platelets: 248 10*3/uL (ref 150–400)
Promyelocytes Relative: 1 %
RBC: 3.61 MIL/uL — AB (ref 4.22–5.81)
RDW: 12.8 % (ref 11.5–15.5)
WBC: 23.6 10*3/uL — AB (ref 4.0–10.5)
nRBC: 0 % (ref 0.0–0.2)
nRBC: 0 /100 WBC

## 2018-05-06 LAB — COMPREHENSIVE METABOLIC PANEL
ALBUMIN: 2.6 g/dL — AB (ref 3.5–5.0)
ALK PHOS: 85 U/L (ref 38–126)
ALT: 28 U/L (ref 0–44)
AST: 22 U/L (ref 15–41)
Anion gap: 5 (ref 5–15)
BILIRUBIN TOTAL: 0.7 mg/dL (ref 0.3–1.2)
BUN: 12 mg/dL (ref 8–23)
CALCIUM: 8.8 mg/dL — AB (ref 8.9–10.3)
CO2: 34 mmol/L — ABNORMAL HIGH (ref 22–32)
CREATININE: 0.71 mg/dL (ref 0.61–1.24)
Chloride: 101 mmol/L (ref 98–111)
GFR calc Af Amer: >60 mL/min (ref >60)
GLUCOSE: 102 mg/dL — AB (ref 70–99)
Potassium: 5.4 mmol/L — ABNORMAL HIGH (ref 3.5–5.1)
Sodium: 140 mmol/L (ref 135–145)
Total Protein: 5.3 g/dL — ABNORMAL LOW (ref 6.5–8.1)

## 2018-05-06 LAB — PROTIME-INR
INR: 1.16
Prothrombin Time: 14.7 seconds (ref 11.4–15.2)

## 2018-05-06 LAB — MAGNESIUM: Magnesium: 2.3 mg/dL (ref 1.7–2.4)

## 2018-05-06 LAB — TSH: TSH: 0.921 u[IU]/mL (ref 0.350–4.500)

## 2018-05-06 LAB — PHOSPHORUS: Phosphorus: 3.7 mg/dL (ref 2.5–4.6)

## 2018-05-06 LAB — HEMOGLOBIN A1C
HEMOGLOBIN A1C: 5.8 % — AB (ref 4.8–5.6)
Mean Plasma Glucose: 119.76 mg/dL
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
## 2018-05-07 ENCOUNTER — Encounter: Payer: Self-pay | Admitting: Internal Medicine

## 2018-05-07 DIAGNOSIS — J84112 Idiopathic pulmonary fibrosis: Secondary | ICD-10-CM | POA: Diagnosis present

## 2018-05-07 DIAGNOSIS — G4733 Obstructive sleep apnea (adult) (pediatric): Secondary | ICD-10-CM | POA: Diagnosis present

## 2018-05-07 DIAGNOSIS — I5032 Chronic diastolic (congestive) heart failure: Secondary | ICD-10-CM | POA: Diagnosis not present

## 2018-05-07 DIAGNOSIS — J84111 Idiopathic interstitial pneumonia, not otherwise specified: Secondary | ICD-10-CM | POA: Diagnosis present

## 2018-05-07 DIAGNOSIS — I482 Chronic atrial fibrillation, unspecified: Secondary | ICD-10-CM | POA: Diagnosis not present

## 2018-05-07 DIAGNOSIS — J9621 Acute and chronic respiratory failure with hypoxia: Secondary | ICD-10-CM | POA: Diagnosis not present

## 2018-05-07 LAB — CBC
HCT: 34.7 % — ABNORMAL LOW (ref 39.0–52.0)
Hemoglobin: 10.8 g/dL — ABNORMAL LOW (ref 13.0–17.0)
MCH: 29.6 pg (ref 26.0–34.0)
MCHC: 31.1 g/dL (ref 30.0–36.0)
MCV: 95.1 fL (ref 80.0–100.0)
NRBC: 0 % (ref 0.0–0.2)
PLATELETS: 253 10*3/uL (ref 150–400)
RBC: 3.65 MIL/uL — AB (ref 4.22–5.81)
RDW: 12.7 % (ref 11.5–15.5)
WBC: 25.1 10*3/uL — AB (ref 4.0–10.5)

## 2018-05-07 LAB — URINALYSIS, ROUTINE W REFLEX MICROSCOPIC
BILIRUBIN URINE: NEGATIVE
Glucose, UA: NEGATIVE mg/dL
Hgb urine dipstick: NEGATIVE
KETONES UR: NEGATIVE mg/dL
Leukocytes, UA: NEGATIVE
Nitrite: NEGATIVE
PH: 6 (ref 5.0–8.0)
Protein, ur: NEGATIVE mg/dL
Specific Gravity, Urine: 1.017 (ref 1.005–1.030)

## 2018-05-07 LAB — MAGNESIUM: MAGNESIUM: 2.4 mg/dL (ref 1.7–2.4)

## 2018-05-07 LAB — BASIC METABOLIC PANEL
ANION GAP: 6 (ref 5–15)
BUN: 16 mg/dL (ref 8–23)
CO2: 30 mmol/L (ref 22–32)
Calcium: 8.7 mg/dL — ABNORMAL LOW (ref 8.9–10.3)
Chloride: 100 mmol/L (ref 98–111)
Creatinine, Ser: 0.81 mg/dL (ref 0.61–1.24)
Glucose, Bld: 151 mg/dL — ABNORMAL HIGH (ref 70–99)
POTASSIUM: 5 mmol/L (ref 3.5–5.1)
SODIUM: 136 mmol/L (ref 135–145)

## 2018-05-07 LAB — PHOSPHORUS: Phosphorus: 4.4 mg/dL (ref 2.5–4.6)

## 2018-05-07 NOTE — Progress Notes (Addendum)
Pulmonary Critical Care Medicine [place]   PULMONARY CRITICAL CARE SERVICE  PROGRESS NOTE  Date of Service: 05/07/2018  Grant Santos  [id number]:[id number]  DOB: 15-Jan-1946   DOA: 05/04/2018  Referring Physician: Merton Border, MD  HPI: Grant Santos is a 72 y.o. male seen for follow up of Acute on Chronic Respiratory Failure.  At this time patient is on high flow nasal cannula he is on 100% oxygen with a 40 L/min flow rate.  Patient and I had another lengthy discussion with discussed the treatment options at this time.  Not a candidate for any surgical intervention obviously but we are going to be trying him on high-dose steroids.  In addition he should obviously avoid amiodarone.  The patient asked about his prognosis and I stated to him that if this is interstitial lung disease idiopathic pulmonary fibrosis his prognosis is not very good in fact quite poor  Medications: Reviewed on Rounds  Physical Exam:  Vitals: Temperature 97.2 pulse 67 respiratory rate 19 blood pressure 103/63 saturations 96%  Ventilator Settings high flow nasal cannula 40 L 100% FiO2  . General: Comfortable at this time . Eyes: Grossly normal lids, irises & conjunctiva . ENT: grossly tongue is normal . Neck: no obvious mass . Cardiovascular: S1 S2 normal no gallop . Respiratory: Coarse rhonchi but crackles bilaterally . Abdomen: soft . Skin: no rash seen on limited exam . Musculoskeletal: not rigid . Psychiatric:unable to assess . Neurologic: no seizure no involuntary movements         Lab Data:   Basic Metabolic Panel: Recent Labs  Lab 05/06/18 0625 05/07/18 0607  NA 140 136  K 5.4* 5.0  CL 101 100  CO2 34* 30  GLUCOSE 102* 151*  BUN 12 16  CREATININE 0.71 0.81  CALCIUM 8.8* 8.7*  MG 2.3 2.4  PHOS 3.7 4.4    ABG: No results for input(s): PHART, PCO2ART, PO2ART, HCO3, O2SAT in the last 168 hours.  Liver Function Tests: Recent Labs  Lab 05/06/18 0625  AST 22   ALT 28  ALKPHOS 85  BILITOT 0.7  PROT 5.3*  ALBUMIN 2.6*   No results for input(s): LIPASE, AMYLASE in the last 168 hours. No results for input(s): AMMONIA in the last 168 hours.  CBC: Recent Labs  Lab 05/06/18 0625 05/07/18 0607  WBC 23.6* 25.1*  NEUTROABS 20.3*  --   HGB 10.9* 10.8*  HCT 34.9* 34.7*  MCV 96.7 95.1  PLT 248 253    Cardiac Enzymes: No results for input(s): CKTOTAL, CKMB, CKMBINDEX, TROPONINI in the last 168 hours.  BNP (last 3 results) No results for input(s): BNP in the last 8760 hours.  ProBNP (last 3 results) No results for input(s): PROBNP in the last 8760 hours.  Radiological Exams: Dg Chest Port 1 View  Result Date: 05/06/2018 CLINICAL DATA:  72 y/o M; 1 week of productive cough with increasing shortness of breath EXAM: PORTABLE CHEST 1 VIEW COMPARISON:  05/01/2018 CT chest.  04/29/2018 chest radiograph. FINDINGS: Stable cardiac silhouette given projection and technique. Two lead pacemaker. Post median sternotomy with wires in alignment. Coarse reticular opacity with peripheral predominance compatible with fibrosis. Superimposed patchy consolidation mildly increased from prior radiographs. No pleural effusion or pneumothorax. No acute osseous abnormality is evident. IMPRESSION: Pulmonary fibrosis. Superimposed patchy consolidation is mildly increased from prior radiographs and compatible with alveolitis or infectious pneumonitis as described on prior chest CT. Electronically Signed   By: Kristine Garbe M.D.   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)   1. Acute on chronic respiratory failure with hypoxia we will continue with oxygen therapy as prescribed.  Overall his mention his prognosis is  poor likely less than 6 months. 2. Chronic atrial fibrillation rate controlled at this time we will continue with supportive care patient is on Cardizem avoid amiodarone 3. Interstitial pneumonia treated we will continue to follow patient is now on high-dose steroids discussed with the primary care team to give him pulse dose and then placed on 60 mg a day of prednisone 4. Chronic diastolic heart failure at baseline we will continue with supportive care monitor fluid status 5. Obstructive sleep apnea patient has been noncompliant with CPAP BiPAP we will continue to monitor   I have personally seen and evaluated the patient, evaluated laboratory and imaging results, formulated the assessment and plan and placed orders.  Time 35 minutes review of the chart discussion with the patient as well as the family and treatment team The Patient requires high complexity decision making for assessment and support.  Case was discussed on Rounds with the Respiratory Therapy Staff  Allyne Gee, MD [facility] Pulmonary Critical Care Medicine Sleep Medicine

## 2018-05-08 DIAGNOSIS — J9621 Acute and chronic respiratory failure with hypoxia: Secondary | ICD-10-CM | POA: Diagnosis not present

## 2018-05-08 DIAGNOSIS — I482 Chronic atrial fibrillation, unspecified: Secondary | ICD-10-CM | POA: Diagnosis not present

## 2018-05-08 DIAGNOSIS — J84111 Idiopathic interstitial pneumonia, not otherwise specified: Secondary | ICD-10-CM | POA: Diagnosis not present

## 2018-05-08 DIAGNOSIS — I5032 Chronic diastolic (congestive) heart failure: Secondary | ICD-10-CM | POA: Diagnosis not present

## 2018-05-08 DIAGNOSIS — J84112 Idiopathic pulmonary fibrosis: Secondary | ICD-10-CM | POA: Diagnosis not present

## 2018-05-08 DIAGNOSIS — G4733 Obstructive sleep apnea (adult) (pediatric): Secondary | ICD-10-CM | POA: Diagnosis not present

## 2018-05-08 LAB — URINE CULTURE: Culture: NO GROWTH

## 2018-05-08 LAB — POTASSIUM: POTASSIUM: 5 mmol/L (ref 3.5–5.1)

## 2018-05-08 NOTE — Progress Notes (Signed)
Pulmonary Critical Care Medicine [place]   PULMONARY CRITICAL CARE SERVICE  PROGRESS NOTE  Date of Service: 05/08/2018  Grant Santos  [id number]  DOB: Feb 11, 1946   DOA: 05/09/2018  Referring Physician: Merton Border, MD  HPI: Grant Santos is a 72 y.o. male seen for follow up of Acute on Chronic Respiratory Failure.  Patient and I had a lengthy discussion regarding his overall medical condition.  His wife was also present in the room and she was updated.  He continues to require 100% oxygen essentially not able to come down on his FiO2.  Medications: Reviewed on Rounds  Physical Exam:  Vitals: Temperature 96.2 pulse 71 respiratory rate 30 blood pressure 123/73 saturation 90%  Ventilator Settings off the ventilator on high flow nasal cannula 40 L/min with 100% FiO2  . General: Comfortable at this time . Eyes: Grossly normal lids, irises & conjunctiva . ENT: grossly tongue is normal . Neck: no obvious mass . Cardiovascular: S1 S2 normal no gallop . Respiratory: Coarse rhonchi noted bilaterally crackling also noted . Abdomen: soft . Skin: no rash seen on limited exam . Musculoskeletal: not rigid . Psychiatric:unable to assess . Neurologic: no seizure no involuntary movements         Lab Data:   Basic Metabolic Panel: Recent Labs  Lab 05/06/18 0625 05/07/18 0607 05/08/18 0500  NA 140 136  --   K 5.4* 5.0 5.0  CL 101 100  --   CO2 34* 30  --   GLUCOSE 102* 151*  --   BUN 12 16  --   CREATININE 0.71 0.81  --   CALCIUM 8.8* 8.7*  --   MG 2.3 2.4  --   PHOS 3.7 4.4  --     ABG: No results for input(s): PHART, PCO2ART, PO2ART, HCO3, O2SAT in the last 168 hours.  Liver Function Tests: Recent Labs  Lab 05/06/18 0625  AST 22  ALT 28  ALKPHOS 85  BILITOT 0.7  PROT 5.3*  ALBUMIN 2.6*   No results for input(s): LIPASE, AMYLASE in the last 168 hours. No results for input(s): AMMONIA in the last 168 hours.  CBC: Recent Labs  Lab  05/06/18 0625 05/07/18 0607  WBC 23.6* 25.1*  NEUTROABS 20.3*  --   HGB 10.9* 10.8*  HCT 34.9* 34.7*  MCV 96.7 95.1  PLT 248 253    Cardiac Enzymes: No results for input(s): CKTOTAL, CKMB, CKMBINDEX, TROPONINI in the last 168 hours.  BNP (last 3 results) No results for input(s): BNP in the last 8760 hours.  ProBNP (last 3 results) No results for input(s): PROBNP in the last 8760 hours.  Radiological Exams: No results found.  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 Acute on chronic respiratory failure with hypoxia we will continue with the high flow nasal cannula titrate oxygen as tolerated. 2. Chronic atrial fibrillation rate controlled right now 3. Idiopathic pulmonary fibrosis treated we will monitor 4. Chronic diastolic heart failure at baseline 5. Obstructive sleep apnea at baseline continue with present therapy 6. Chronic interstitial pneumonia prognosis guarded   I have personally seen and evaluated the patient, evaluated laboratory and imaging results, formulated the assessment and plan and placed orders. The Patient requires high complexity decision making for assessment and  support.  Case was discussed on Rounds with the Respiratory Therapy Staff  Allyne Gee, MD [facility] Pulmonary Critical Care Medicine Sleep Medicine

## 2018-05-09 DIAGNOSIS — G4733 Obstructive sleep apnea (adult) (pediatric): Secondary | ICD-10-CM | POA: Diagnosis not present

## 2018-05-09 DIAGNOSIS — J9621 Acute and chronic respiratory failure with hypoxia: Secondary | ICD-10-CM | POA: Diagnosis not present

## 2018-05-09 DIAGNOSIS — J84112 Idiopathic pulmonary fibrosis: Secondary | ICD-10-CM | POA: Diagnosis not present

## 2018-05-09 DIAGNOSIS — I482 Chronic atrial fibrillation, unspecified: Secondary | ICD-10-CM | POA: Diagnosis not present

## 2018-05-09 DIAGNOSIS — I5032 Chronic diastolic (congestive) heart failure: Secondary | ICD-10-CM | POA: Diagnosis not present

## 2018-05-09 DIAGNOSIS — J84111 Idiopathic interstitial pneumonia, not otherwise specified: Secondary | ICD-10-CM | POA: Diagnosis not present

## 2018-05-09 NOTE — Progress Notes (Signed)
Pulmonary Critical Care Medicine [place]   PULMONARY CRITICAL CARE SERVICE  PROGRESS NOTE  Date of Service: 05/09/2018  Grant Santos  OZD:[id number]  DOB: 1945-08-03   DOA: 04/22/2018  Referring Physician: Merton Border, MD  HPI: Grant Santos is a 72 y.o. male seen for follow up of Acute on Chronic Respiratory Failure.  Patient is doing about the same he remains on 100% oxygen without any significant improvement.  He is currently on 40 L/min flow rate.  Medications: Reviewed on Rounds  Physical Exam:  Vitals: Temperature is 96.8 pulse 86 respiratory rate was 45 blood pressure 101/67 saturations 97%  Ventilator Settings off the ventilator right now is on high flow nasal cannula  . General: Comfortable at this time . Eyes: Grossly normal lids, irises & conjunctiva . ENT: grossly tongue is normal . Neck: no obvious mass . Cardiovascular: S1 S2 normal no gallop . Respiratory: Coarse rhonchi expansion equal . Abdomen: soft . Skin: no rash seen on limited exam . Musculoskeletal: not rigid . Psychiatric:unable to assess . Neurologic: no seizure no involuntary movements         Lab Data:   Basic Metabolic Panel: Recent Labs  Lab 05/06/18 0625 05/07/18 0607 05/08/18 0500  NA 140 136  --   K 5.4* 5.0 5.0  CL 101 100  --   CO2 34* 30  --   GLUCOSE 102* 151*  --   BUN 12 16  --   CREATININE 0.71 0.81  --   CALCIUM 8.8* 8.7*  --   MG 2.3 2.4  --   PHOS 3.7 4.4  --     ABG: No results for input(s): PHART, PCO2ART, PO2ART, HCO3, O2SAT in the last 168 hours.  Liver Function Tests: Recent Labs  Lab 05/06/18 0625  AST 22  ALT 28  ALKPHOS 85  BILITOT 0.7  PROT 5.3*  ALBUMIN 2.6*   No results for input(s): LIPASE, AMYLASE in the last 168 hours. No results for input(s): AMMONIA in the last 168 hours.  CBC: Recent Labs  Lab 05/06/18 0625 05/07/18 0607  WBC 23.6* 25.1*  NEUTROABS 20.3*  --   HGB 10.9* 10.8*  HCT 34.9* 34.7*  MCV  96.7 95.1  PLT 248 253    Cardiac Enzymes: No results for input(s): CKTOTAL, CKMB, CKMBINDEX, TROPONINI in the last 168 hours.  BNP (last 3 results) No results for input(s): BNP in the last 8760 hours.  ProBNP (last 3 results) No results for input(s): PROBNP in the last 8760 hours.  Radiological Exams: No results found.  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 Acute on chronic respiratory failure with hypoxia I had a lengthy discussion with him regarding his overall prognosis and outcome of his situation.  He understands that he is not likely going to survive this and may not make it back home.  He and his wife have discussed this matter and they have decided that he will be a DNR with no intubation no CPR no drugs.  We will continue with present therapy as we are. 2. Chronic atrial fibrillation rate controlled at this time we will continue to monitor 3. Idiopathic pulmonary fibrosis advanced disease severe with honeycombing prognosis poor 4. Chronic diastolic heart failure monitor fluid status 5.  OSA we will continue with present management prognosis guarded 6. Chronic interstitial pneumonia as noted above poor prognosis patient is not likely to survive this hospitalizations he may be a candidate for hospice and palliative care   I have personally seen and evaluated the patient, evaluated laboratory and imaging results, formulated the assessment and plan and placed orders. The Patient requires high complexity decision making for assessment and support.  Case was discussed on Rounds with the Respiratory Therapy Staff  Allyne Gee, MD [facility] Pulmonary Critical Care Medicine Sleep Medicine

## 2018-05-10 DIAGNOSIS — J84111 Idiopathic interstitial pneumonia, not otherwise specified: Secondary | ICD-10-CM | POA: Diagnosis not present

## 2018-05-10 DIAGNOSIS — J84112 Idiopathic pulmonary fibrosis: Secondary | ICD-10-CM | POA: Diagnosis not present

## 2018-05-10 DIAGNOSIS — I482 Chronic atrial fibrillation, unspecified: Secondary | ICD-10-CM | POA: Diagnosis not present

## 2018-05-10 DIAGNOSIS — G4733 Obstructive sleep apnea (adult) (pediatric): Secondary | ICD-10-CM | POA: Diagnosis not present

## 2018-05-10 DIAGNOSIS — I5032 Chronic diastolic (congestive) heart failure: Secondary | ICD-10-CM | POA: Diagnosis not present

## 2018-05-10 DIAGNOSIS — J9621 Acute and chronic respiratory failure with hypoxia: Secondary | ICD-10-CM | POA: Diagnosis not present

## 2018-05-10 NOTE — Progress Notes (Signed)
Pulmonary Critical Care Medicine [place]   PULMONARY CRITICAL CARE SERVICE  PROGRESS NOTE  Date of Service: 05/10/2018  Grant Santos  [id number]  DOB: May 21, 1946   DOA: 04/23/2018  Referring Physician: Merton Border, MD  HPI: Grant Santos is a 72 y.o. male seen for follow up of Acute on Chronic Respiratory Failure.  Patient currently is on 100% FiO2 40 L flow rate still  Medications: Reviewed on Rounds  Physical Exam:  Vitals: Temperature 96.7 pulse 63 respiratory rate 16 blood pressure 122/68 saturations 100%  Ventilator Settings off the ventilator on high flow nasal cannula  . General: Comfortable at this time . Eyes: Grossly normal lids, irises & conjunctiva . ENT: grossly tongue is normal . Neck: no obvious mass . Cardiovascular: S1 S2 normal no gallop . Respiratory: Coarse breath sounds with few rhonchi . Abdomen: soft . Skin: no rash seen on limited exam . Musculoskeletal: not rigid . Psychiatric:unable to assess . Neurologic: no seizure no involuntary movements         Lab Data:   Basic Metabolic Panel: Recent Labs  Lab 05/06/18 0625 05/07/18 0607 05/08/18 0500  NA 140 136  --   K 5.4* 5.0 5.0  CL 101 100  --   CO2 34* 30  --   GLUCOSE 102* 151*  --   BUN 12 16  --   CREATININE 0.71 0.81  --   CALCIUM 8.8* 8.7*  --   MG 2.3 2.4  --   PHOS 3.7 4.4  --     ABG: No results for input(s): PHART, PCO2ART, PO2ART, HCO3, O2SAT in the last 168 hours.  Liver Function Tests: Recent Labs  Lab 05/06/18 0625  AST 22  ALT 28  ALKPHOS 85  BILITOT 0.7  PROT 5.3*  ALBUMIN 2.6*   No results for input(s): LIPASE, AMYLASE in the last 168 hours. No results for input(s): AMMONIA in the last 168 hours.  CBC: Recent Labs  Lab 05/06/18 0625 05/07/18 0607  WBC 23.6* 25.1*  NEUTROABS 20.3*  --   HGB 10.9* 10.8*  HCT 34.9* 34.7*  MCV 96.7 95.1  PLT 248 253    Cardiac Enzymes: No results for input(s): CKTOTAL, CKMB,  CKMBINDEX, TROPONINI in the last 168 hours.  BNP (last 3 results) No results for input(s): BNP in the last 8760 hours.  ProBNP (last 3 results) No results for input(s): PROBNP in the last 8760 hours.  Radiological Exams: No results found.  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 Acute on chronic respiratory failure with hypoxia poor prognosis with underlying interstitial fibrosis.  Patient has made himself a DNR 2. Chronic atrial fibrillation rate controlled at this time 3. Idiopathic pulmonary fibrosis advanced disease with a poor prognosis. 4. Chronic diastolic heart failure continue to monitor fluid status 5. OSA at baseline 6. Chronic interstitial pneumonia patient had been started on steroids which will be continued overall prognosis poor   I have personally seen and evaluated the patient, evaluated laboratory and imaging results, formulated the assessment and plan and placed orders. The Patient requires high complexity decision making for assessment and support.  Case was discussed on Rounds with the Respiratory Therapy Staff  Allyne Gee, MD [facility] Pulmonary Critical Care Medicine Sleep Medicine

## 2018-05-11 DIAGNOSIS — I482 Chronic atrial fibrillation, unspecified: Secondary | ICD-10-CM | POA: Diagnosis not present

## 2018-05-11 DIAGNOSIS — J84112 Idiopathic pulmonary fibrosis: Secondary | ICD-10-CM | POA: Diagnosis not present

## 2018-05-11 DIAGNOSIS — J9621 Acute and chronic respiratory failure with hypoxia: Secondary | ICD-10-CM | POA: Diagnosis not present

## 2018-05-11 DIAGNOSIS — I5032 Chronic diastolic (congestive) heart failure: Secondary | ICD-10-CM | POA: Diagnosis not present

## 2018-05-11 DIAGNOSIS — J84111 Idiopathic interstitial pneumonia, not otherwise specified: Secondary | ICD-10-CM | POA: Diagnosis not present

## 2018-05-11 DIAGNOSIS — G4733 Obstructive sleep apnea (adult) (pediatric): Secondary | ICD-10-CM | POA: Diagnosis not present

## 2018-05-11 LAB — CBC
HCT: 32.5 % — ABNORMAL LOW (ref 39.0–52.0)
HEMOGLOBIN: 10.3 g/dL — AB (ref 13.0–17.0)
MCH: 30.3 pg (ref 26.0–34.0)
MCHC: 31.7 g/dL (ref 30.0–36.0)
MCV: 95.6 fL (ref 80.0–100.0)
Platelets: 167 10*3/uL (ref 150–400)
RBC: 3.4 MIL/uL — ABNORMAL LOW (ref 4.22–5.81)
RDW: 13.3 % (ref 11.5–15.5)
WBC: 13.3 10*3/uL — AB (ref 4.0–10.5)
nRBC: 0 % (ref 0.0–0.2)

## 2018-05-11 LAB — PHOSPHORUS: PHOSPHORUS: 3.5 mg/dL (ref 2.5–4.6)

## 2018-05-11 LAB — BASIC METABOLIC PANEL
Anion gap: 9 (ref 5–15)
BUN: 13 mg/dL (ref 8–23)
CHLORIDE: 98 mmol/L (ref 98–111)
CO2: 32 mmol/L (ref 22–32)
Calcium: 8.6 mg/dL — ABNORMAL LOW (ref 8.9–10.3)
Creatinine, Ser: 0.76 mg/dL (ref 0.61–1.24)
GFR calc non Af Amer: >60 mL/min (ref >60)
Glucose, Bld: 109 mg/dL — ABNORMAL HIGH (ref 70–99)
POTASSIUM: 4.1 mmol/L (ref 3.5–5.1)
SODIUM: 139 mmol/L (ref 135–145)

## 2018-05-11 LAB — MAGNESIUM: MAGNESIUM: 2.4 mg/dL (ref 1.7–2.4)

## 2018-05-11 NOTE — Progress Notes (Signed)
Pulmonary Critical Care Medicine [place]   PULMONARY CRITICAL CARE SERVICE  PROGRESS NOTE  Date of Service: 05/11/2018  Grant Santos  [id number]  DOB: [birth year]   DOA: 05/08/2018  Referring Physician: Merton Border, MD  HPI: Grant Santos is a 72 y.o. male seen for follow up of Acute on Chronic Respiratory Failure.  He is still on 100% oxygen no distress at this time.  Overall as noted previously prognosis is poor  Medications: Reviewed on Rounds  Physical Exam:  Vitals: Temperature 96.9 pulse 96 respiratory 23 blood pressure 131/70 saturations 96%  Ventilator Settings on high flow nasal cannula 100% FiO2  . General: Comfortable at this time . Eyes: Grossly normal lids, irises & conjunctiva . ENT: grossly tongue is normal . Neck: no obvious mass . Cardiovascular: S1 S2 normal no gallop . Respiratory: No rhonchi no rales . Abdomen: soft . Skin: no rash seen on limited exam . Musculoskeletal: not rigid . Psychiatric:unable to assess . Neurologic: no seizure no involuntary movements         Lab Data:   Basic Metabolic Panel: Recent Labs  Lab 05/06/18 0625 05/07/18 0607 05/08/18 0500 05/11/18 0717  NA 140 136  --  139  K 5.4* 5.0 5.0 4.1  CL 101 100  --  98  CO2 34* 30  --  32  GLUCOSE 102* 151*  --  109*  BUN 12 16  --  13  CREATININE 0.71 0.81  --  0.76  CALCIUM 8.8* 8.7*  --  8.6*  MG 2.3 2.4  --  2.4  PHOS 3.7 4.4  --  3.5    ABG: No results for input(s): PHART, PCO2ART, PO2ART, HCO3, O2SAT in the last 168 hours.  Liver Function Tests: Recent Labs  Lab 05/06/18 0625  AST 22  ALT 28  ALKPHOS 85  BILITOT 0.7  PROT 5.3*  ALBUMIN 2.6*   No results for input(s): LIPASE, AMYLASE in the last 168 hours. No results for input(s): AMMONIA in the last 168 hours.  CBC: Recent Labs  Lab 05/06/18 0625 05/07/18 0607 05/11/18 0717  WBC 23.6* 25.1* 13.3*  NEUTROABS 20.3*  --   --   HGB 10.9* 10.8* 10.3*  HCT 34.9* 34.7*  32.5*  MCV 96.7 95.1 95.6  PLT 248 253 167    Cardiac Enzymes: No results for input(s): CKTOTAL, CKMB, CKMBINDEX, TROPONINI in the last 168 hours.  BNP (last 3 results) No results for input(s): BNP in the last 8760 hours.  ProBNP (last 3 results) No results for input(s): PROBNP in the last 8760 hours.  Radiological Exams: No results found.  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 Acute on chronic respiratory failure with hypoxia we will continue with oxygen therapy supportive care patient is DNR 2. Chronic atrial fibrillation rate controlled 3. Idiopathic pulmonary fibrosis severe disease poor prognosis need to consider palliative care and hospice 4. Chronic diastolic heart failure at baseline 5. Sleep apnea on oxygen therapy 6. Chronic interstitial pneumonia on high-dose steroids poor prognosis   I have personally seen and evaluated the patient, evaluated laboratory and imaging results, formulated the assessment and plan and placed orders. The Patient requires high complexity decision making for assessment and support.  Case was discussed on Rounds with  the Respiratory Therapy Staff  Allyne Gee, MD [facility] Pulmonary Critical Care Medicine Sleep Medicine

## 2018-05-11 DEATH — deceased

## 2018-05-12 DIAGNOSIS — G4733 Obstructive sleep apnea (adult) (pediatric): Secondary | ICD-10-CM

## 2018-05-12 DIAGNOSIS — J9621 Acute and chronic respiratory failure with hypoxia: Secondary | ICD-10-CM

## 2018-05-12 DIAGNOSIS — J84111 Idiopathic interstitial pneumonia, not otherwise specified: Secondary | ICD-10-CM

## 2018-05-12 DIAGNOSIS — I5032 Chronic diastolic (congestive) heart failure: Secondary | ICD-10-CM

## 2018-05-12 DIAGNOSIS — I482 Chronic atrial fibrillation, unspecified: Secondary | ICD-10-CM

## 2018-05-12 DIAGNOSIS — J84112 Idiopathic pulmonary fibrosis: Secondary | ICD-10-CM

## 2018-05-12 NOTE — Progress Notes (Signed)
Pulmonary Critical Care Medicine [place]   PULMONARY CRITICAL CARE SERVICE  PROGRESS NOTE  Date of Service: 05/12/2018  Grant Santos  [id number]  DOB: [birth year]   DOA: 04/28/2018  Referring Physician: Merton Border, MD  HPI: Grant Santos is a 72 y.o. male seen for follow up of Acute on Chronic Respiratory Failure.  Patient remains on full nasal cannula support with high flow oxygen at 100% FiO2 he is desaturating even with minimal conversation at this point.  I spoke to him along with the family that was present in the room regarding his overall prognosis being likely less than 4 months.  I think he is a candidate for hospice and palliative care and I made that clear to them also.  Case manager is going to discuss further with the family regarding how to proceed from here.  Medications: Reviewed on Rounds  Physical Exam:  Vitals: Temperature 97.0 pulse 75 respiratory rate 26 blood pressure 112/70 saturations 98%  Ventilator Settings off of the ventilator currently on high flow nasal cannula FiO2 100% 40 L flow rate  . General: Comfortable at this time . Eyes: Grossly normal lids, irises & conjunctiva . ENT: grossly tongue is normal . Neck: no obvious mass . Cardiovascular: S1 S2 normal no gallop . Respiratory: Coarse crackling bilaterally . Abdomen: soft . Skin: no rash seen on limited exam . Musculoskeletal: not rigid . Psychiatric:unable to assess . Neurologic: no seizure no involuntary movements         Lab Data:   Basic Metabolic Panel: Recent Labs  Lab 05/06/18 0625 05/07/18 0607 05/08/18 0500 05/11/18 0717  NA 140 136  --  139  K 5.4* 5.0 5.0 4.1  CL 101 100  --  98  CO2 34* 30  --  32  GLUCOSE 102* 151*  --  109*  BUN 12 16  --  13  CREATININE 0.71 0.81  --  0.76  CALCIUM 8.8* 8.7*  --  8.6*  MG 2.3 2.4  --  2.4  PHOS 3.7 4.4  --  3.5    ABG: No results for input(s): PHART, PCO2ART, PO2ART, HCO3, O2SAT in the last 168  hours.  Liver Function Tests: Recent Labs  Lab 05/06/18 0625  AST 22  ALT 28  ALKPHOS 85  BILITOT 0.7  PROT 5.3*  ALBUMIN 2.6*   No results for input(s): LIPASE, AMYLASE in the last 168 hours. No results for input(s): AMMONIA in the last 168 hours.  CBC: Recent Labs  Lab 05/06/18 0625 05/07/18 0607 05/11/18 0717  WBC 23.6* 25.1* 13.3*  NEUTROABS 20.3*  --   --   HGB 10.9* 10.8* 10.3*  HCT 34.9* 34.7* 32.5*  MCV 96.7 95.1 95.6  PLT 248 253 167    Cardiac Enzymes: No results for input(s): CKTOTAL, CKMB, CKMBINDEX, TROPONINI in the last 168 hours.  BNP (last 3 results) No results for input(s): BNP in the last 8760 hours.  ProBNP (last 3 results) No results for input(s): PROBNP in the last 8760 hours.  Radiological Exams: No results found.  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 Acute on chronic respiratory failure with hypoxia patient's prognosis is extremely poor with poor survivability from his underlying disease.  He is now desaturating even at  baseline. 2. Chronic atrial fibrillation rate controlled we will continue to monitor 3. Idiopathic pulmonary fibrosis with chronic interstitial pneumonia patient had been tried on steroids not really showing any significant response 4. Chronic diastolic heart failure we will continue to monitor his fluid status. 5. Chronic interstitial pneumonia present therapy 6. Sleep apnea at baseline we will monitor   I have personally seen and evaluated the patient, evaluated laboratory and imaging results, formulated the assessment and plan and placed orders. The Patient requires high complexity decision making for assessment and support.  Case was discussed on Rounds with the Respiratory Therapy  Staff  Allyne Gee, MD [facility] Pulmonary Critical Care Medicine Sleep Medicine

## 2018-05-13 NOTE — Progress Notes (Signed)
Pulmonary Critical Care Medicine [place]   PULMONARY CRITICAL CARE SERVICE  PROGRESS NOTE  Date of Service: 05/13/2018  Grant Santos  [id number]:[id number]  DOB: 05/29/1946   DOA: 05/08/2018  Referring Physician: Merton Border, MD  HPI: Grant Santos is a 72 y.o. male seen for follow up of Acute on Chronic Respiratory Failure.  Remains with same percent FiO2 high flow nasal cannula.  Some 40 L flow rate  Medications: Reviewed on Rounds  Physical Exam:  Vitals: Temperature 98.4 pulse 82 respiratory 16 blood pressure 108/62 saturations 100%  Ventilator Settings patient is on high flow nasal cannula 40 L FiO2 is 100%  . General: Comfortable at this time . Eyes: Grossly normal lids, irises & conjunctiva . ENT: grossly tongue is normal . Neck: no obvious mass . Cardiovascular: S1 S2 normal no gallop . Respiratory: Coarse breath sounds with few rhonchi . Abdomen: soft . Skin: no rash seen on limited exam . Musculoskeletal: not rigid . Psychiatric:unable to assess . Neurologic: no seizure no involuntary movements         Lab Data:   Basic Metabolic Panel: Recent Labs  Lab 05/07/18 0607 05/08/18 0500 05/11/18 0717  NA 136  --  139  K 5.0 5.0 4.1  CL 100  --  98  CO2 30  --  32  GLUCOSE 151*  --  109*  BUN 16  --  13  CREATININE 0.81  --  0.76  CALCIUM 8.7*  --  8.6*  MG 2.4  --  2.4  PHOS 4.4  --  3.5    ABG: No results for input(s): PHART, PCO2ART, PO2ART, HCO3, O2SAT in the last 168 hours.  Liver Function Tests: No results for input(s): AST, ALT, ALKPHOS, BILITOT, PROT, ALBUMIN in the last 168 hours. No results for input(s): LIPASE, AMYLASE in the last 168 hours. No results for input(s): AMMONIA in the last 168 hours.  CBC: Recent Labs  Lab 05/07/18 0607 05/11/18 0717  WBC 25.1* 13.3*  HGB 10.8* 10.3*  HCT 34.7* 32.5*  MCV 95.1 95.6  PLT 253 167    Cardiac Enzymes: No results for input(s): CKTOTAL, CKMB, CKMBINDEX, TROPONINI in  the last 168 hours.  BNP (last 3 results) No results for input(s): BNP in the last 8760 hours.  ProBNP (last 3 results) No results for input(s): PROBNP in the last 8760 hours.  Radiological Exams: No results found.  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 Acute on chronic respiratory failure with hypoxia continue with high flow nasal cannula patient is DNR not to be intubated or resuscitated.  Patient should be a candidate for hospice care we will discuss with case management. 2. Chronic atrial fibrillation rate controlled 3. Idiopathic pulmonary fibrosis at baseline we will continue to monitor 4. Obstructive sleep apnea at baseline 5. Chronic interstitial pneumonia and poor prognosis on steroids 6. Chronic diastolic heart failure follow fluid status closely   I have personally seen and evaluated the patient, evaluated laboratory and imaging results, formulated the assessment and plan and placed orders. The Patient requires high complexity decision making for assessment and support.  Case was discussed on Rounds with the Respiratory Therapy Staff  Allyne Gee, MD [facility] Pulmonary Critical Care Medicine Sleep Medicine

## 2018-05-14 NOTE — Progress Notes (Signed)
Pulmonary Critical Care Medicine [place]   PULMONARY CRITICAL CARE SERVICE  PROGRESS NOTE  Date of Service: 05/14/2018  Grant Santos  [id number]:[id number]  DOB: 21-Jul-1945   DOA: 04/21/2018  Referring Physician: Merton Border, MD  HPI: Grant Santos is a 72 y.o. male seen for follow up of Acute on Chronic Respiratory Failure.  Spoke to patient today he is in agreement with that talking to hospice.  He understands that he does not have much time and wants to likely start the discussions with hospice with him and his wife being present  Medications: Reviewed on Rounds  Physical Exam:  Vitals: Temperature 97.2 pulse 82 respiratory 26 blood pressure 110/58 saturations 92%  Ventilator Settings off the ventilator on 100% FiO2 high flow nasal cannula 40 L/min flow rate  . General: Comfortable at this time . Eyes: Grossly normal lids, irises & conjunctiva . ENT: grossly tongue is normal . Neck: no obvious mass . Cardiovascular: S1 S2 normal no gallop . Respiratory: Coarse rhonchi expansion is equal . Abdomen: soft . Skin: no rash seen on limited exam . Musculoskeletal: not rigid . Psychiatric:unable to assess . Neurologic: no seizure no involuntary movements         Lab Data:   Basic Metabolic Panel: Recent Labs  Lab 05/08/18 0500 05/11/18 0717  NA  --  139  K 5.0 4.1  CL  --  98  CO2  --  32  GLUCOSE  --  109*  BUN  --  13  CREATININE  --  0.76  CALCIUM  --  8.6*  MG  --  2.4  PHOS  --  3.5    ABG: No results for input(s): PHART, PCO2ART, PO2ART, HCO3, O2SAT in the last 168 hours.  Liver Function Tests: No results for input(s): AST, ALT, ALKPHOS, BILITOT, PROT, ALBUMIN in the last 168 hours. No results for input(s): LIPASE, AMYLASE in the last 168 hours. No results for input(s): AMMONIA in the last 168 hours.  CBC: Recent Labs  Lab 05/11/18 0717  WBC 13.3*  HGB 10.3*  HCT 32.5*  MCV 95.6  PLT 167    Cardiac Enzymes: No results  for input(s): CKTOTAL, CKMB, CKMBINDEX, TROPONINI in the last 168 hours.  BNP (last 3 results) No results for input(s): BNP in the last 8760 hours.  ProBNP (last 3 results) No results for input(s): PROBNP in the last 8760 hours.  Radiological Exams: No results found.  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 Acute on chronic respiratory failure with hypoxia we will continue with oxygen therapy right now is on 40 L 100% FiO2 prognosis poor agree with hospice recommendations 2. Chronic atrial fibrillation rate controlled at this time 3. pulmonary fibrosis severe end-stage disease prognosis is very poor. 4. Chronic diastolic heart failure monitor fluid status 5. Obstructive sleep apnea on oxygen therapy not a candidate for BiPAP because of severe hypoxia 6. Chronic interstitial pneumonia on steroids prognosis poor   I have personally seen and evaluated the patient, evaluated laboratory and imaging results, formulated the assessment and plan and placed orders. The Patient requires high complexity decision making for assessment and support.  Case was discussed on Rounds with the Respiratory Therapy Staff  Allyne Gee, MD [facility] Pulmonary Critical Care  Medicine Sleep Medicine

## 2018-05-15 NOTE — Progress Notes (Signed)
Pulmonary Critical Care Medicine [place]   PULMONARY CRITICAL CARE SERVICE  PROGRESS NOTE  Date of Service: 05/15/2018  AKSHATH MCCAREY  [id number]  DOB: 06-Oct-1945   DOA: 04/26/2018  Referring Physician: Merton Border, MD  HPI: JAROME TRULL is a 72 y.o. male seen for follow up of Acute on Chronic Respiratory Failure.  He is doing about the same he easily desaturates baseline saturations are about 88% on 100% FiO2  Medications: Reviewed on Rounds  Physical Exam:  Vitals: Temperature 97.0 pulse 90 respiratory rate 26 blood pressure 117/63 saturations 88%  Ventilator Settings currently off the ventilator on 100% FiO2 high flow nasal cannula with 40 L flow  . General: Comfortable at this time . Eyes: Grossly normal lids, irises & conjunctiva . ENT: grossly tongue is normal . Neck: no obvious mass . Cardiovascular: S1 S2 normal no gallop . Respiratory: No rhonchi or rales are noted at this time . Abdomen: soft . Skin: no rash seen on limited exam . Musculoskeletal: not rigid . Psychiatric:unable to assess . Neurologic: no seizure no involuntary movements         Lab Data:   Basic Metabolic Panel: Recent Labs  Lab 05/11/18 0717  NA 139  K 4.1  CL 98  CO2 32  GLUCOSE 109*  BUN 13  CREATININE 0.76  CALCIUM 8.6*  MG 2.4  PHOS 3.5    ABG: No results for input(s): PHART, PCO2ART, PO2ART, HCO3, O2SAT in the last 168 hours.  Liver Function Tests: No results for input(s): AST, ALT, ALKPHOS, BILITOT, PROT, ALBUMIN in the last 168 hours. No results for input(s): LIPASE, AMYLASE in the last 168 hours. No results for input(s): AMMONIA in the last 168 hours.  CBC: Recent Labs  Lab 05/11/18 0717  WBC 13.3*  HGB 10.3*  HCT 32.5*  MCV 95.6  PLT 167    Cardiac Enzymes: No results for input(s): CKTOTAL, CKMB, CKMBINDEX, TROPONINI in the last 168 hours.  BNP (last 3 results) No results for input(s): BNP in the last 8760 hours.  ProBNP  (last 3 results) No results for input(s): PROBNP in the last 8760 hours.  Radiological Exams: No results found.  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 Acute on chronic respiratory failure with hypoxia we will continue with oxygen therapy patient is considering hospice care Case management is going to speak with him. 2. Chronic atrial fibrillation rate controlled 3. Idiopathic pulmonary fibrosis with chronic interstitial pneumonitis advanced severe disease continue with present management prognosis poor 4. Chronic diastolic heart failure at baseline 5. Sleep apnea nonissue at this time patient is on high flow oxygen   I have personally seen and evaluated the patient, evaluated laboratory and imaging results, formulated the assessment and plan and placed orders. The Patient requires high complexity decision making for assessment and support.  Case was discussed on Rounds with the Respiratory Therapy Staff  Allyne Gee, MD [facility] Pulmonary Critical Care Medicine Sleep Medicine

## 2018-05-16 NOTE — Progress Notes (Signed)
Pulmonary Critical Care Medicine [place]   PULMONARY CRITICAL CARE SERVICE  PROGRESS NOTE  Date of Service: 05/16/2018  Grant Santos  VZD:[id number]  DOB: Mar 24, 1946   DOA: 04/26/2018  Referring Physician: Merton Border, MD  HPI: Grant Santos is a 72 y.o. male seen for follow up of Acute on Chronic Respiratory Failure.  Patient is on high flow nasal cannula had a another lengthy discussion with the patient as well as his wife and son explained to them that he is not doing well they want to do comfort care and they would like to stay here while doing the comfort care.  Explained to them that per the process of comfort care what it entails.  They want to wait until remainder of their family comes in to see him.  Medications: Reviewed on Rounds  Physical Exam:  Vitals: Temperature 97.0 pulse 90 respiratory 29 blood pressure 133/68 saturations 82%  Ventilator Settings off the ventilator on high flow nasal cannula 100% FiO2 40 L  . General: Comfortable at this time . Eyes: Grossly normal lids, irises & conjunctiva . ENT: grossly tongue is normal . Neck: no obvious mass . Cardiovascular: S1 S2 normal no gallop . Respiratory: . Coarse breath sounds bilaterally . Abdomen: soft . Skin: no rash seen on limited exam . Musculoskeletal: not rigid . Psychiatric:unable to assess . Neurologic: no seizure no involuntary movements         Lab Data:   Basic Metabolic Panel: Recent Labs  Lab 05/11/18 0717  NA 139  K 4.1  CL 98  CO2 32  GLUCOSE 109*  BUN 13  CREATININE 0.76  CALCIUM 8.6*  MG 2.4  PHOS 3.5    ABG: No results for input(s): PHART, PCO2ART, PO2ART, HCO3, O2SAT in the last 168 hours.  Liver Function Tests: No results for input(s): AST, ALT, ALKPHOS, BILITOT, PROT, ALBUMIN in the last 168 hours. No results for input(s): LIPASE, AMYLASE in the last 168 hours. No results for input(s): AMMONIA in the last 168 hours.  CBC: Recent Labs  Lab  05/11/18 0717  WBC 13.3*  HGB 10.3*  HCT 32.5*  MCV 95.6  PLT 167    Cardiac Enzymes: No results for input(s): CKTOTAL, CKMB, CKMBINDEX, TROPONINI in the last 168 hours.  BNP (last 3 results) No results for input(s): BNP in the last 8760 hours.  ProBNP (last 3 results) No results for input(s): PROBNP in the last 8760 hours.  Radiological Exams: No results found.  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 Acute on chronic respiratory failure with hypoxia patient is clinically getting worse he has had increased desaturations noted with minimal activity.  He realizes this at this time he wants to proceed with comfort care. 2. Chronic atrial fibrillation rate controlled 3. 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 Chronic interstitial pneumonia not responding to therapy 5. Obstructive sleep apnea nonissue 6. Chronic diastolic heart failure continue with supportive care   I have personally seen and evaluated the patient, evaluated laboratory and imaging results, formulated the assessment and plan and placed orders. The Patient requires high complexity decision making for assessment and support.  Case was discussed on Rounds with the Respiratory Therapy Staff  Allyne Gee, MD [facility] Pulmonary Critical Care  Medicine Sleep Medicine

## 2018-05-17 NOTE — Progress Notes (Signed)
Pulmonary Critical Care Medicine [place]   PULMONARY CRITICAL CARE SERVICE  PROGRESS NOTE  Date of Service: 05/17/2018  Grant Santos  [id number]  DOB: July 16, 1945   DOA: 04/11/2018  Referring Physician: Merton Border, MD  HPI: Grant Santos is a 72 y.o. male seen for follow up of Acute on Chronic Respiratory Failure.  Patient is on heated high flow nasal cannula at 100%.  Patient is DNR but family has not made a decision about comfort care at this time.  Medications: Reviewed on Rounds  Physical Exam:  Vitals: Pulse 80 respirations 28 blood pressure 107/52 O2 saturation 81% currently temperature 97.1  Ventilator Settings patient is not currently on the ventilator.  . General: Comfortable at this time . Eyes: Grossly normal lids, irises & conjunctiva . ENT: grossly tongue is normal . Neck: no obvious mass . Cardiovascular: S1 S2 normal no gallop . Respiratory: Coarse breath sounds. . Abdomen: soft . Skin: no rash seen on limited exam . Musculoskeletal: not rigid . Psychiatric:unable to assess . Neurologic: no seizure no involuntary movements         Lab Data:   Basic Metabolic Panel: Recent Labs  Lab 05/11/18 0717  NA 139  K 4.1  CL 98  CO2 32  GLUCOSE 109*  BUN 13  CREATININE 0.76  CALCIUM 8.6*  MG 2.4  PHOS 3.5    ABG: No results for input(s): PHART, PCO2ART, PO2ART, HCO3, O2SAT in the last 168 hours.  Liver Function Tests: No results for input(s): AST, ALT, ALKPHOS, BILITOT, PROT, ALBUMIN in the last 168 hours. No results for input(s): LIPASE, AMYLASE in the last 168 hours. No results for input(s): AMMONIA in the last 168 hours.  CBC: Recent Labs  Lab 05/11/18 0717  WBC 13.3*  HGB 10.3*  HCT 32.5*  MCV 95.6  PLT 167    Cardiac Enzymes: No results for input(s): CKTOTAL, CKMB, CKMBINDEX, TROPONINI in the last 168 hours.  BNP (last 3 results) No results for input(s): BNP in the last 8760 hours.  ProBNP (last 3  results) No results for input(s): PROBNP in the last 8760 hours.  Radiological Exams: No results found.  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 Acute on chronic respiratory failure with hypoxia patient clinically worsening and O2 saturations remain in the low 80s on 100% high flow nasal cannula. 2. Chronic atrial fibrillation rate controlled 3. 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 Chronic interstitial pneumonia unresponsive to therapy 5. Obstructive sleep apnea nonissue 6. Chronic diastolic heart failure continue with supportive care   I have personally seen and evaluated the patient, evaluated laboratory and imaging results, formulated the assessment and plan and placed orders. The Patient requires high complexity decision making for assessment and support.  Case was discussed on Rounds with the Respiratory Therapy Staff  Allyne Gee, MD [facility] Pulmonary Critical Care Medicine Sleep Medicine

## 2018-05-19 DIAGNOSIS — J189 Pneumonia, unspecified organism: Secondary | ICD-10-CM | POA: Diagnosis not present

## 2018-05-19 DIAGNOSIS — I714 Abdominal aortic aneurysm, without rupture: Secondary | ICD-10-CM | POA: Diagnosis not present

## 2018-05-19 DIAGNOSIS — J9620 Acute and chronic respiratory failure, unspecified whether with hypoxia or hypercapnia: Secondary | ICD-10-CM | POA: Diagnosis not present

## 2018-05-19 DIAGNOSIS — I482 Chronic atrial fibrillation, unspecified: Secondary | ICD-10-CM | POA: Diagnosis not present

## 2018-06-11 NOTE — Progress Notes (Signed)
Pulmonary Critical Care Medicine [place]   PULMONARY CRITICAL CARE SERVICE  PROGRESS NOTE  Date of Service: 2018-05-28  Grant Santos  [id number]:[id number]  DOB: [birth year]   DOA: 04/18/2018  Referring Physician: Merton Border, MD  HPI: Grant Santos is a 73 y.o. male seen for follow up of Acute on Chronic Respiratory Failure.  Patient has been changed to comfort care.  He remains on high flow nasal cannula at 40 L however for his comfort a nonrebreather is also in place.  He intermittently removes that and then puts it back on.  This is slowly for his comfort at this time.  Had discussion with wife and visitors at bedside who questioned the validity of his nonrebreather.  I encouraged the patient to use nonrebreather as needed for comfort until comfort care medications are titrated to a comfortable level.  Medications: Reviewed on Rounds  Physical Exam:  Vitals: Pulse 73 respirations 22 blood pressure 111/64 O2 saturation 92% temp 96.4  Ventilator Settings patient does not currently on ventilator  . General: Comfortable at this time . Eyes: Grossly normal lids, irises & conjunctiva . ENT: grossly tongue is normal . Neck: no obvious mass . Cardiovascular: S1 S2 normal no gallop . Respiratory: Coarse breath sounds throughout . Abdomen: soft . Skin: no rash seen on limited exam . Musculoskeletal: not rigid . Psychiatric:unable to assess . Neurologic: no seizure no involuntary movements         Lab Data:   Basic Metabolic Panel: No results for input(s): NA, K, CL, CO2, GLUCOSE, BUN, CREATININE, CALCIUM, MG, PHOS in the last 168 hours.  ABG: No results for input(s): PHART, PCO2ART, PO2ART, HCO3, O2SAT in the last 168 hours.  Liver Function Tests: No results for input(s): AST, ALT, ALKPHOS, BILITOT, PROT, ALBUMIN in the last 168 hours. No results for input(s): LIPASE, AMYLASE in the last 168 hours. No results for input(s): AMMONIA in the last 168  hours.  CBC: No results for input(s): WBC, NEUTROABS, HGB, HCT, MCV, PLT in the last 168 hours.  Cardiac Enzymes: No results for input(s): CKTOTAL, CKMB, CKMBINDEX, TROPONINI in the last 168 hours.  BNP (last 3 results) No results for input(s): BNP in the last 8760 hours.  ProBNP (last 3 results) No results for input(s): PROBNP in the last 8760 hours.  Radiological Exams: No results found.  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 Acute on chronic respiratory failure with hypoxia patient is on comfort care and utilizing high flow nasal cannula as well as nonrebreather at this time.  Patient's comfort care medications are being titrated currently. 2. Chronic atrial fibrillation rate controlled 3. Idiopathic pulmonary fibrosis severe and terminal at this time 4. Chronic interstitial pneumonia unresponsive to therapy 5. Obstructive sleep apnea nonissue 6. Chronic diastolic heart failure continue comfort care   I have personally seen and evaluated the patient, evaluated laboratory and imaging results, formulated the assessment and plan and placed orders. The Patient requires high complexity decision making for assessment and support.  Case was discussed on Rounds with the Respiratory Therapy Staff  Allyne Gee, MD [facility] Pulmonary Critical Care Medicine Sleep Medicine

## 2018-06-11 DEATH — deceased
